# Patient Record
Sex: Female | Born: 1945
Health system: Southern US, Community
[De-identification: ages and names within clinical notes are randomized; demographics above are authoritative.]

## PROBLEM LIST (undated history)

## (undated) DIAGNOSIS — M859 Disorder of bone density and structure, unspecified: Secondary | ICD-10-CM

## (undated) DIAGNOSIS — M1990 Unspecified osteoarthritis, unspecified site: Secondary | ICD-10-CM

## (undated) DIAGNOSIS — B0229 Other postherpetic nervous system involvement: Secondary | ICD-10-CM

## (undated) DIAGNOSIS — IMO0001 Reserved for inherently not codable concepts without codable children: Secondary | ICD-10-CM

## (undated) DIAGNOSIS — F32A Depression, unspecified: Secondary | ICD-10-CM

## (undated) DIAGNOSIS — M8580 Other specified disorders of bone density and structure, unspecified site: Secondary | ICD-10-CM

## (undated) DIAGNOSIS — F329 Major depressive disorder, single episode, unspecified: Secondary | ICD-10-CM

## (undated) DIAGNOSIS — K219 Gastro-esophageal reflux disease without esophagitis: Secondary | ICD-10-CM

## (undated) DIAGNOSIS — E785 Hyperlipidemia, unspecified: Secondary | ICD-10-CM

## (undated) DIAGNOSIS — F419 Anxiety disorder, unspecified: Secondary | ICD-10-CM

## (undated) DIAGNOSIS — L719 Rosacea, unspecified: Secondary | ICD-10-CM

## (undated) DIAGNOSIS — Z8619 Personal history of other infectious and parasitic diseases: Secondary | ICD-10-CM

## (undated) DIAGNOSIS — R002 Palpitations: Secondary | ICD-10-CM

## (undated) DIAGNOSIS — I471 Supraventricular tachycardia, unspecified: Secondary | ICD-10-CM

## (undated) DIAGNOSIS — I5189 Other ill-defined heart diseases: Secondary | ICD-10-CM

## (undated) DIAGNOSIS — R011 Cardiac murmur, unspecified: Secondary | ICD-10-CM

## (undated) DIAGNOSIS — M797 Fibromyalgia: Secondary | ICD-10-CM

## (undated) DIAGNOSIS — I5030 Unspecified diastolic (congestive) heart failure: Secondary | ICD-10-CM

## (undated) DIAGNOSIS — L570 Actinic keratosis: Secondary | ICD-10-CM

## (undated) DIAGNOSIS — Z974 Presence of external hearing-aid: Secondary | ICD-10-CM

## (undated) DIAGNOSIS — I10 Essential (primary) hypertension: Secondary | ICD-10-CM

## (undated) DIAGNOSIS — J45909 Unspecified asthma, uncomplicated: Secondary | ICD-10-CM

## (undated) DIAGNOSIS — R55 Syncope and collapse: Secondary | ICD-10-CM

## (undated) DIAGNOSIS — I340 Nonrheumatic mitral (valve) insufficiency: Secondary | ICD-10-CM

## (undated) DIAGNOSIS — K589 Irritable bowel syndrome without diarrhea: Secondary | ICD-10-CM

## (undated) HISTORY — DX: Gastro-esophageal reflux disease without esophagitis: K21.9

## (undated) HISTORY — DX: Rosacea, unspecified: L71.9

## (undated) HISTORY — PX: TONSILLECTOMY: SHX5217

## (undated) HISTORY — PX: LAPAROSCOPIC OOPHERECTOMY: SHX6507

## (undated) HISTORY — PX: TONSILLECTOMY: SUR1361

## (undated) HISTORY — PX: BREAST SURGERY: SHX581

## (undated) HISTORY — DX: Other specified disorders of bone density and structure, unspecified site: M85.80

## (undated) HISTORY — DX: Fibromyalgia: M79.7

## (undated) HISTORY — DX: Disorder of bone density and structure, unspecified: M85.9

## (undated) HISTORY — DX: Reserved for inherently not codable concepts without codable children: IMO0001

## (undated) HISTORY — DX: Actinic keratosis: L57.0

## (undated) HISTORY — DX: Depression, unspecified: F32.A

## (undated) HISTORY — DX: Unspecified asthma, uncomplicated: J45.909

## (undated) HISTORY — DX: Irritable bowel syndrome, unspecified: K58.9

## (undated) HISTORY — PX: HAND SURGERY: SHX662

## (undated) HISTORY — PX: JOINT REPLACEMENT: SHX530

## (undated) HISTORY — DX: Major depressive disorder, single episode, unspecified: F32.9

---

## 1976-03-07 DIAGNOSIS — R569 Unspecified convulsions: Secondary | ICD-10-CM

## 1976-03-07 HISTORY — DX: Unspecified convulsions: R56.9

## 1977-03-07 HISTORY — PX: BREAST EXCISIONAL BIOPSY: SUR124

## 1988-03-07 HISTORY — PX: FOOT SURGERY: SHX648

## 2004-07-21 ENCOUNTER — Ambulatory Visit: Payer: Self-pay | Admitting: Family Medicine

## 2004-11-29 ENCOUNTER — Ambulatory Visit: Payer: Self-pay | Admitting: Internal Medicine

## 2005-01-18 ENCOUNTER — Ambulatory Visit: Payer: Self-pay | Admitting: Unknown Physician Specialty

## 2007-03-08 HISTORY — PX: ABDOMINAL HYSTERECTOMY: SHX81

## 2008-06-05 LAB — HM DEXA SCAN

## 2009-11-16 ENCOUNTER — Ambulatory Visit: Payer: Self-pay | Admitting: Family Medicine

## 2009-11-16 LAB — HM MAMMOGRAPHY

## 2009-12-01 LAB — HM PAP SMEAR

## 2010-12-31 ENCOUNTER — Ambulatory Visit: Payer: Self-pay | Admitting: Gastroenterology

## 2011-01-19 ENCOUNTER — Ambulatory Visit: Payer: Self-pay | Admitting: Gastroenterology

## 2011-01-19 HISTORY — PX: COLONOSCOPY: SHX174

## 2011-01-19 LAB — HM COLONOSCOPY: HM Colonoscopy: NORMAL

## 2011-01-21 LAB — PATHOLOGY REPORT

## 2011-09-28 LAB — CBC
HCT: 41.5 % (ref 35.0–47.0)
MCH: 31.9 pg (ref 26.0–34.0)
Platelet: 191 10*3/uL (ref 150–440)
RBC: 4.29 10*6/uL (ref 3.80–5.20)
WBC: 8 10*3/uL (ref 3.6–11.0)

## 2011-09-28 LAB — URINALYSIS, COMPLETE
Bilirubin,UR: NEGATIVE
Glucose,UR: NEGATIVE mg/dL (ref 0–75)
RBC,UR: 2 /HPF (ref 0–5)
Specific Gravity: 1.01 (ref 1.003–1.030)
WBC UR: 2 /HPF (ref 0–5)

## 2011-09-28 LAB — COMPREHENSIVE METABOLIC PANEL
Albumin: 3.9 g/dL (ref 3.4–5.0)
Anion Gap: 13 (ref 7–16)
BUN: 14 mg/dL (ref 7–18)
Chloride: 103 mmol/L (ref 98–107)
Co2: 22 mmol/L (ref 21–32)
EGFR (African American): >60
Glucose: 113 mg/dL — ABNORMAL HIGH (ref 65–99)
Osmolality: 277 (ref 275–301)
SGOT(AST): 28 U/L (ref 15–37)
SGPT (ALT): 18 U/L
Sodium: 138 mmol/L (ref 136–145)
Total Protein: 7.1 g/dL (ref 6.4–8.2)

## 2011-09-28 LAB — CK TOTAL AND CKMB (NOT AT ARMC): CK, Total: 94 U/L (ref 21–215)

## 2011-09-29 ENCOUNTER — Observation Stay: Payer: Self-pay | Admitting: Internal Medicine

## 2011-09-29 LAB — TROPONIN I: Troponin-I: <0.02 ng/mL

## 2012-08-20 ENCOUNTER — Ambulatory Visit: Payer: Self-pay | Admitting: Unknown Physician Specialty

## 2012-10-24 ENCOUNTER — Ambulatory Visit: Payer: Self-pay | Admitting: Family Medicine

## 2012-12-20 ENCOUNTER — Ambulatory Visit: Payer: Self-pay | Admitting: Podiatry

## 2014-06-24 NOTE — H&P (Signed)
PATIENT NAME:  Andrea Savage, Andrea Savage MR#:  [id number] DATE OF BIRTH:  1945/04/18  DATE OF ADMISSION:  09/29/2011  PRIMARY CARE PHYSICIAN: Dr. Golden Pop.   CHIEF COMPLAINT: Near syncope.   HISTORY OF PRESENT ILLNESS: Ms. Duguay is a 69 year old pleasant Caucasian female with unremarkable past medical history. She was in her usual state of health until last evening while she was finishing her meeting at the church around 8:00 p.m. She felt hot and started sweating profusely. She felt that her legs and hands were tingling and generally she felt strange. Thereafter, she tried to drink some water and then she felt that she is going to faint. At that point, she decided to go to her car. She drove back to her home and when she reached there, she tried to walk on the front steps but she states that her leg would not go over the steps the feeling of generalized weakness. She felt that she was going to pass out. She was soaking wet from profuse sweating. At that point, her husband saw her in that shape and they decided to call 911. The patient was transported to the Emergency Department for further evaluation. Her symptoms gradually faded away and now she feels back to normal. Initial work-up including EKG and blood work-up were all unremarkable. The patient was admitted for 24-hour observation on telemetry monitoring. The patient reports no preceding symptoms to her near syncope. Denies any palpitations. No chest pain. No headache. No blurring of vision. No vomiting. No nausea. No abdominal pain.   REVIEW OF SYSTEMS: CONSTITUTIONAL: Denies any fever. No chills. No night sweats. EYES: No blurring of vision. No double vision. ENT: No hearing impairment. No sore throat. No dysphagia. CARDIOVASCULAR: No chest pain. No shortness of breath. No edema. No true syncope, but she had near syncope symptoms. RESPIRATORY: No chest pain. No shortness of breath. No cough. GASTROINTESTINAL: No abdominal pain. No vomiting, no  diarrhea. GENITOURINARY: No dysuria. No frequency of urination. MUSCULOSKELETAL: No joint pain or swelling. No muscular pain or swelling. INTEGUMENTARY: No skin rash. No ulcers. NEUROLOGY: No focal weakness. No seizure activity. No headache. No ataxia, but during the event she had generalized weakness. PSYCHIATRY: No anxiety. No depression. ENDOCRINE: No polyuria or polydipsia. No heat or cold intolerance.   PAST MEDICAL HISTORY: She reports an episode several years ago but was labeled as heat stroke with similar symptoms. Otherwise her past medical history is healthy.   PAST SURGICAL HISTORY:  1. Bilateral salpingo-oophorectomy in 2006.  2. Colonoscopy in November 2012 which was normal and upper endoscopy in November 2012 showing reflux esophagitis.  3. The patient also suffers from chronic neck pain and recently she had work-up including MRI.   FAMILY HISTORY: Her sister suffers from diabetes mellitus. Her mother died from breast cancer. Her father suffered from emphysema but he died from pancreatic cancer. Her grandmother died from complications of tuberculosis.   SOCIAL HABITS: Nonsmoker. No history of alcohol abuse.   SOCIAL HISTORY: She is married, living with her husband. She works as a [employer]ar[employer] manager.   ADMISSION MEDICATIONS:  1. Prilosec 40 mg once a day.  2. Citalopram 10 mg a day.   ALLERGIES: Compazine.   PHYSICAL EXAMINATION:  VITAL SIGNS: Blood pressure 139/84, respiratory rate 20, pulse 127, however, by EKG her heart rate was 64, temperature 97, oxygen saturation 99%.   GENERAL APPEARANCE: Elderly pleasant lady lying in bed in no acute distress, healthy looking.   HEAD AND NECK: No pallor. No icterus.  No cyanosis.   ENT: Hearing was normal. Nasal mucosa, lips, tongue were normal.   EYES: Normal iris and conjunctivae. Pupils about 5 mm, equal and reactive to light.   NECK: Supple. Trachea at midline. No thyromegaly. No cervical masses.   HEART: Normal S1, S2. No S3 or S4. There  is a grade 1/6 systolic murmur at the left sternal border. The patient is aware about this murmur for a long time. No carotid bruits.   RESPIRATORY: Normal breathing pattern without use of accessory muscles. No rales. No wheezing.   ABDOMEN: Soft without tenderness. No hepatosplenomegaly. No masses. No hernias.   SKIN: No ulcers. No subcutaneous nodules.   MUSCULOSKELETAL: No joint swelling. No clubbing.   NEUROLOGIC: Cranial nerves II through XII are intact. No focal motor deficit.   PSYCHIATRIC: The patient is alert and oriented x3. Mood and affect were normal.   LABORATORY, RADIOLOGICAL AND DIAGNOSTIC DATA: CT scan of the head was negative for any acute intracranial abnormality. Her EKG showed normal sinus rhythm at rate of 64 per minute. There is one premature atrial complex. Incomplete right bundle branch block. Otherwise unremarkable EKG. Her serum glucose 113, BUN 14, creatinine 0.8, sodium 138, potassium 3.5. Normal liver function tests. Total CPK 94. Troponin less than 0.02. CBC showed normal white count 8,000, hemoglobin 13, hematocrit 41, platelet count 191. Urinalysis was unremarkable.   ASSESSMENT:  1. Near syncope. Etiology is unclear. It could be vasovagal attack or some form of arrhythmia. Transient ischemic attack is less likely. Hypoglycemia could be another possibility. Nevertheless, her symptoms resolved gradually.  2. Chronic neck pain.  3. Reflux esophagitis by upper endoscopy in November 2012.   PLAN:  1. Will admit the patient for observation for 24 hours on telemetry monitoring.  2. Follow up on cardiac enzymes.  3. Will obtain carotid duplex scan.  4. The patient indicates that she did not have a LIVING WILL nor has she considered appointing anybody to have power of attorney.       TIME SPENT IN EVALUATING THIS PATIENT: More than 55 minutes.   ____________________________ Clovis Pu. Lenore Manner, MD amd:ap D: 09/29/2011 03:14:43 ET T: 09/29/2011 08:57:13  ET JOB#: [id number]  cc: Clovis Pu. Lenore Manner, MD, <Dictator> Guadalupe Maple, MD Mike Craze Irven Coe MD ELECTRONICALLY SIGNED 09/30/2011 22:27

## 2014-06-24 NOTE — Discharge Summary (Signed)
PATIENT NAME:  Andrea Savage, Andrea Savage MR#:  [id number] DATE OF BIRTH:  1945-06-08  DATE OF ADMISSION:  09/29/2011 DATE OF DISCHARGE:  09/29/2011  ADMITTING DIAGNOSIS: Syncope with collapse with some altered mental status.   DISCHARGE DIAGNOSES:  1. Syncope with collapse of unclear etiology, possibly due to heat exhaustion. No evidence of any arrhythmias noted. Her carotid Doppler's did show plaque but no significant stenosis. Her echocardiogram was negative and her telemetry showed no arrhythmias. Cardiac enzymes were negative. History of similar type of presentation in [place] after being dehydrated.  2. Status post bilateral salpingo-oophorectomy.  3. Chronic neck pain. Work-up with MRI was negative.   PERTINENT LABS AND EVALUATIONS: CT scan the head was negative for acute intracranial abnormality.   EKG showed normal sinus rhythm, PVC, incomplete right bundle branch block.   Glucose 113, BUN 14, creatinine 0.8, sodium 138, potassium 3.5. LFTs were normal. CPK 94. Troponin less than 0.02. CBC showed WBC count 8000, hemoglobin 13, platelet count 191. Urinalysis was negative.   CT scan of the head showed no acute abnormality.   Echocardiogram showed normal EF, right ventricular size was normal, left atrium size was normal.  Carotid ultrasound showed no evidence of hemodynamic stenosis. There is calcified plaque seen in the carotid bulb in the proximal ICA.  PA and lateral chest x-ray was negative.  EKG showed sinus rhythm with PVCs.   CONSULTANTS: None.   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 she started feeling hot and started sweating profusely. The patient started feeling legs and hands were tingling. She thinks that she passed out. She was going "in and out". EMS was called. When they  arrived they did not notice any significant abnormality. Apparently in the ED initially they were not able to detect her temperature. The patient was admitted to the hospital. She was monitored on telemetry. She had actually been seen in the ED the night before and finally we were asked to admit her around 12 o'clock. She stayed in the hospital up to 5 in the evening without any arrhythmias and no symptoms. She was ambulating fine. Her echocardiogram was negative. Carotid Doppler's showed no significant stenosis. No arrhythmias were noted. She is doing much better and is very interested in going home. If she has another episode like this, she needs a Holter monitor. The patient also has had a stress test done which has been negative in the last year. She will be referred to Cardiology again to see if she needs any further stress test. At this time she is stable for discharge.   DISCHARGE MEDICATIONS:  1. Prilosec 40 daily.  2. Citalopram 10 daily.  3. Iodine mild 1 tab p.o. daily.   HOME OXYGEN: None.   DIET: Regular.   ACTIVITY: As tolerated.   TIMEFRAME FOR FOLLOW-UP:  1. Follow-up in 1 to 2 weeks with Dr. Golden Pop. 2. Follow-up in 2 to 4 weeks with [facility].   TIME SPENT: 35 minutes.  ____________________________ Lafonda Mosses Posey Pronto, MD shp:drc D: 09/30/2011 16:37:42 ET T: 10/01/2011 10:58:50 ET JOB#: [id number] cc: Silena Wyss H. Posey Pronto, MD, <Dictator>, Guadalupe Maple, MD Alric Seton MD ELECTRONICALLY SIGNED 10/04/2011 12:15

## 2014-08-18 ENCOUNTER — Other Ambulatory Visit: Payer: Self-pay

## 2014-08-18 MED ORDER — CITALOPRAM HYDROBROMIDE 20 MG PO TABS: 20.0000 mg | ORAL_TABLET | Freq: Every day | ORAL | Status: DC

## 2014-08-18 NOTE — Telephone Encounter (Signed)
Andrea Savage, she has not been seen in almost 2 years. She left a message stating she'd call next week to schedule an appt. She wants a 90 day supply.

## 2015-06-17 DIAGNOSIS — L719 Rosacea, unspecified: Secondary | ICD-10-CM | POA: Insufficient documentation

## 2015-06-17 DIAGNOSIS — K219 Gastro-esophageal reflux disease without esophagitis: Secondary | ICD-10-CM

## 2015-06-17 DIAGNOSIS — IMO0001 Reserved for inherently not codable concepts without codable children: Secondary | ICD-10-CM | POA: Insufficient documentation

## 2015-06-17 DIAGNOSIS — M8580 Other specified disorders of bone density and structure, unspecified site: Secondary | ICD-10-CM | POA: Insufficient documentation

## 2015-06-26 ENCOUNTER — Other Ambulatory Visit: Payer: Self-pay | Admitting: Unknown Physician Specialty

## 2015-06-29 ENCOUNTER — Encounter: Payer: Self-pay | Admitting: Family Medicine

## 2015-06-29 ENCOUNTER — Ambulatory Visit (INDEPENDENT_AMBULATORY_CARE_PROVIDER_SITE_OTHER): Payer: Medicare Other | Admitting: Family Medicine

## 2015-06-29 VITALS — BP 136/80 | HR 78 | Temp 98.6°F | Ht 64.0 in | Wt 148.0 lb

## 2015-06-29 DIAGNOSIS — Z0000 Encounter for general adult medical examination without abnormal findings: Secondary | ICD-10-CM | POA: Diagnosis not present

## 2015-06-29 DIAGNOSIS — Z13220 Encounter for screening for lipoid disorders: Secondary | ICD-10-CM | POA: Diagnosis not present

## 2015-06-29 DIAGNOSIS — K219 Gastro-esophageal reflux disease without esophagitis: Secondary | ICD-10-CM | POA: Diagnosis not present

## 2015-06-29 DIAGNOSIS — M797 Fibromyalgia: Secondary | ICD-10-CM | POA: Diagnosis not present

## 2015-06-29 DIAGNOSIS — F32A Depression, unspecified: Secondary | ICD-10-CM | POA: Insufficient documentation

## 2015-06-29 DIAGNOSIS — M8580 Other specified disorders of bone density and structure, unspecified site: Secondary | ICD-10-CM | POA: Diagnosis not present

## 2015-06-29 DIAGNOSIS — F329 Major depressive disorder, single episode, unspecified: Secondary | ICD-10-CM

## 2015-06-29 DIAGNOSIS — Z1231 Encounter for screening mammogram for malignant neoplasm of breast: Secondary | ICD-10-CM

## 2015-06-29 DIAGNOSIS — M25511 Pain in right shoulder: Secondary | ICD-10-CM

## 2015-06-29 DIAGNOSIS — IMO0001 Reserved for inherently not codable concepts without codable children: Secondary | ICD-10-CM

## 2015-06-29 NOTE — Assessment & Plan Note (Signed)
Continue to monitor. Call with any concerns.  

## 2015-06-29 NOTE — Assessment & Plan Note (Signed)
Thinks this is due to thyroid issues. Continue current regimen. Labs ordered today to recheck thyroid.

## 2015-06-29 NOTE — Patient Instructions (Addendum)
Health Risk Assessment and Personalized Prevention Plan: Done today Bone Mass Measurements: Ordered today Breast Cancer Screening: Ordered today CVD Screening: Done today Cervical Cancer Screening: N/A Colon Cancer Screening: up to date Depression Screening: declined Diabetes Screening: done today Glaucoma Screening: see your eye doctor Hepatitis B vaccine: up to date Hepatitis C screening: up to date HIV Screening: N/A Flu Vaccine: Post-poned to flu season Lung cancer Screening: N/A Obesity Screening: Done today Pneumonia Vaccines (2): Will consider STI Screening: N/A  Menopause is a normal process in which your reproductive ability comes to an end. This process happens gradually over a span of months to years, usually between the ages of 54 and 70. Menopause is complete when you have missed 12 consecutive menstrual periods. It is important to talk with your health care provider about some of the most common conditions that affect postmenopausal women, such as heart disease, cancer, and bone loss (osteoporosis). Adopting a healthy lifestyle and getting preventive care can help to promote your health and wellness. Those actions can also lower your chances of developing some of these common conditions. WHAT SHOULD I KNOW ABOUT MENOPAUSE? During menopause, you may experience a number of symptoms, such as:  Moderate-to-severe hot flashes.  Night sweats.  Decrease in sex drive.  Mood swings.  Headaches.  Tiredness.  Irritability.  Memory problems.  Insomnia. Choosing to treat or not to treat menopausal changes is an individual decision that you make with your health care provider. WHAT SHOULD I KNOW ABOUT HORMONE REPLACEMENT THERAPY AND SUPPLEMENTS? Hormone therapy products are effective for treating symptoms that are associated with menopause, such as hot flashes and night sweats. Hormone replacement carries certain risks, especially as you become older. If you are thinking  about using estrogen or estrogen with progestin treatments, discuss the benefits and risks with your health care provider. WHAT SHOULD I KNOW ABOUT HEART DISEASE AND STROKE? Heart disease, heart attack, and stroke become more likely as you age. This may be due, in part, to the hormonal changes that your body experiences during menopause. These can affect how your body processes dietary fats, triglycerides, and cholesterol. Heart attack and stroke are both medical emergencies. There are many things that you can do to help prevent heart disease and stroke:  Have your blood pressure checked at least every 1-2 years. High blood pressure causes heart disease and increases the risk of stroke.  If you are 32-4 years old, ask your health care provider if you should take aspirin to prevent a heart attack or a stroke.  Do not use any tobacco products, including cigarettes, chewing tobacco, or electronic cigarettes. If you need help quitting, ask your health care provider.  It is important to eat a healthy diet and maintain a healthy weight.  Be sure to include plenty of vegetables, fruits, low-fat dairy products, and lean protein.  Avoid eating foods that are high in solid fats, added sugars, or salt (sodium).  Get regular exercise. This is one of the most important things that you can do for your health.  Try to exercise for at least 150 minutes each week. The type of exercise that you do should increase your heart rate and make you sweat. This is known as moderate-intensity exercise.  Try to do strengthening exercises at least twice each week. Do these in addition to the moderate-intensity exercise.  Know your numbers.Ask your health care provider to check your cholesterol and your blood glucose. Continue to have your blood tested as directed by your  health care provider. WHAT SHOULD I KNOW ABOUT CANCER SCREENING? There are several types of cancer. Take the following steps to reduce your risk and  to catch any cancer development as early as possible. Breast Cancer  Practice breast self-awareness.  This means understanding how your breasts normally appear and feel.  It also means doing regular breast self-exams. Let your health care provider know about any changes, no matter how small.  If you are 40 or older, have a clinician do a breast exam (clinical breast exam or CBE) every year. Depending on your age, family history, and medical history, it may be recommended that you also have a yearly breast X-ray (mammogram).  If you have a family history of breast cancer, talk with your health care provider about genetic screening.  If you are at high risk for breast cancer, talk with your health care provider about having an MRI and a mammogram every year.  Breast cancer (BRCA) gene test is recommended for women who have family members with BRCA-related cancers. Results of the assessment will determine the need for genetic counseling and BRCA1 and for BRCA2 testing. BRCA-related cancers include these types:  Breast. This occurs in males or females.  Ovarian.  Tubal. This may also be called fallopian tube cancer.  Cancer of the abdominal or pelvic lining (peritoneal cancer).  Prostate.  Pancreatic. Cervical, Uterine, and Ovarian Cancer Your health care provider may recommend that you be screened regularly for cancer of the pelvic organs. These include your ovaries, uterus, and vagina. This screening involves a pelvic exam, which includes checking for microscopic changes to the surface of your cervix (Pap test).  For women ages 21-65, health care providers may recommend a pelvic exam and a Pap test every three years. For women ages 86-65, they may recommend the Pap test and pelvic exam, combined with testing for human papilloma virus (HPV), every five years. Some types of HPV increase your risk of cervical cancer. Testing for HPV may also be done on women of any age who have unclear Pap  test results.  Other health care providers may not recommend any screening for nonpregnant women who are considered low risk for pelvic cancer and have no symptoms. Ask your health care provider if a screening pelvic exam is right for you.  If you have had past treatment for cervical cancer or a condition that could lead to cancer, you need Pap tests and screening for cancer for at least 20 years after your treatment. If Pap tests have been discontinued for you, your risk factors (such as having a new sexual partner) need to be reassessed to determine if you should start having screenings again. Some women have medical problems that increase the chance of getting cervical cancer. In these cases, your health care provider may recommend that you have screening and Pap tests more often.  If you have a family history of uterine cancer or ovarian cancer, talk with your health care provider about genetic screening.  If you have vaginal bleeding after reaching menopause, tell your health care provider.  There are currently no reliable tests available to screen for ovarian cancer. Lung Cancer Lung cancer screening is recommended for adults 44-26 years old who are at high risk for lung cancer because of a history of smoking. A yearly low-dose CT scan of the lungs is recommended if you:  Currently smoke.  Have a history of at least 30 pack-years of smoking and you currently smoke or have quit within the  past 15 years. A pack-year is smoking an average of one pack of cigarettes per day for one year. Yearly screening should:  Continue until it has been 15 years since you quit.  Stop if you develop a health problem that would prevent you from having lung cancer treatment. Colorectal Cancer  This type of cancer can be detected and can often be prevented.  Routine colorectal cancer screening usually begins at age 34 and continues through age 68.  If you have risk factors for colon cancer, your health  care provider may recommend that you be screened at an earlier age.  If you have a family history of colorectal cancer, talk with your health care provider about genetic screening.  Your health care provider may also recommend using home test kits to check for hidden blood in your stool.  A small camera at the end of a tube can be used to examine your colon directly (sigmoidoscopy or colonoscopy). This is done to check for the earliest forms of colorectal cancer.  Direct examination of the colon should be repeated every 5-10 years until age 18. However, if early forms of precancerous polyps or small growths are found or if you have a family history or genetic risk for colorectal cancer, you may need to be screened more often. Skin Cancer  Check your skin from head to toe regularly.  Monitor any moles. Be sure to tell your health care provider:  About any new moles or changes in moles, especially if there is a change in a mole's shape or color.  If you have a mole that is larger than the size of a pencil eraser.  If any of your family members has a history of skin cancer, especially at a young age, talk with your health care provider about genetic screening.  Always use sunscreen. Apply sunscreen liberally and repeatedly throughout the day.  Whenever you are outside, protect yourself by wearing long sleeves, pants, a wide-brimmed hat, and sunglasses. WHAT SHOULD I KNOW ABOUT OSTEOPOROSIS? Osteoporosis is a condition in which bone destruction happens more quickly than new bone creation. After menopause, you may be at an increased risk for osteoporosis. To help prevent osteoporosis or the bone fractures that can happen because of osteoporosis, the following is recommended:  If you are 75-38 years old, get at least 1,000 mg of calcium and at least 600 mg of vitamin D per day.  If you are older than age 33 but younger than age 42, get at least 1,200 mg of calcium and at least 600 mg of  vitamin D per day.  If you are older than age 28, get at least 1,200 mg of calcium and at least 800 mg of vitamin D per day. Smoking and excessive alcohol intake increase the risk of osteoporosis. Eat foods that are rich in calcium and vitamin D, and do weight-bearing exercises several times each week as directed by your health care provider. WHAT SHOULD I KNOW ABOUT HOW MENOPAUSE AFFECTS [place]? Depression may occur at any age, but it is more common as you become older. Common symptoms of depression include:  Low or sad mood.  Changes in sleep patterns.  Changes in appetite or eating patterns.  Feeling an overall lack of motivation or enjoyment of activities that you previously enjoyed.  Frequent crying spells. Talk with your health care provider if you think that you are experiencing depression. WHAT SHOULD I KNOW ABOUT IMMUNIZATIONS? It is important that you get and maintain  your immunizations. These include:  Tetanus, diphtheria, and pertussis (Tdap) booster vaccine.  Influenza every year before the flu season begins.  Pneumonia vaccine.  Shingles vaccine. Your health care provider may also recommend other immunizations.   This information is not intended to replace advice given to you by your health care provider. Make sure you discuss any questions you have with your health care provider.   Document Released: 04/15/2005 Document Revised: 03/14/2014 Document Reviewed: 10/24/2013 Elsevier Interactive Patient Education 2016 [place] Directive Advance directives are the legal documents that allow you to make choices about your health care and medical treatment if you cannot speak for yourself. Advance directives are a way for you to communicate your wishes to family, friends, and health care providers. The specified people can then convey your decisions about end-of-life care to avoid confusion if you should become unable to communicate. Ideally, the  process of discussing and writing advance directives should happen over time rather than making decisions all at once. Advance directives can be modified as your situation changes, and you can change your mind at any time, even after you have signed the advance directives. Each state has its own laws regarding advance directives. You may want to check with your health care provider, attorney, or state representative about the law in your state. Below are some examples of advance directives. LIVING WILL A living will is a set of instructions documenting your wishes about medical care when you cannot care for yourself. It is used if you become:  Terminally ill.  Incapacitated.  Unable to communicate.  Unable to make decisions. Items to consider in your living will include:  The use or non-use of life-sustaining equipment, such as dialysis machines and breathing machines (ventilators).  A do not resuscitate (DNR) order, which is the instruction not to use cardiopulmonary resuscitation (CPR) if breathing or heartbeat stops.  Tube feeding.  Withholding of food and fluids.  Comfort (palliative) care when the goal becomes comfort rather than a cure.  Organ and tissue donation. A living will does not give instructions about distribution of your money and property if you should pass away. It is advisable to seek the expert advice of a lawyer in drawing up a will regarding your possessions. Decisions about taxes, beneficiaries, and asset distribution will be legally binding. This process can relieve your family and friends of any burdens surrounding disputes or questions that may come up about the allocation of your assets. DO NOT RESUSCITATE (DNR) A do not resuscitate (DNR) order is a request to not have CPR in the event that your heart stops beating or you stop breathing. Unless given other instructions, a health care provider will try to help any patient whose heart has stopped or who has stopped  breathing.  HEALTH CARE PROXY AND DURABLE POWER OF ATTORNEY FOR HEALTH CARE A health care proxy is a person (agent) appointed to make medical decisions for you if you cannot. Generally, people choose someone they know well and trust to represent their preferences when they can no longer do so. You should be sure to ask this person for agreement to act as your agent. An agent may have to exercise judgment in the event of a medical decision for which your wishes are not known. The durable power of attorney for health care is the legal document that names your health care proxy. Once written, it should be:  Signed.  Notarized.  Dated.  Copied.  Witnessed.  Incorporated into your medical  record. You may also want to appoint someone to manage your financial affairs if you cannot. This is called a durable power of attorney for finances. It is a separate legal document from the durable power of attorney for health care. You may choose the same person or someone different from your health care proxy to act as your agent in financial matters.   This information is not intended to replace advice given to you by your health care provider. Make sure you discuss any questions you have with your health care provider.   Document Released: 05/31/2007 Document Revised: 02/26/2013 Document Reviewed: 07/11/2012 Elsevier Interactive Patient Education 2016 [place]. Pneumococcal Vaccine, Polyvalent suspension for injection What is this medicine? PNEUMOCOCCAL VACCINE (NEU mo KOK al vak SEEN) is a vaccine used to prevent pneumococcus bacterial infections. These bacteria can cause serious infections like pneumonia, meningitis, and blood infections. This vaccine will lower your chance of getting pneumonia. If you do get pneumonia, it can make your symptoms milder and your illness shorter. This vaccine will not treat an infection and will not cause infection. This vaccine is recommended for infants and young  children, adults with certain medical conditions, and adults 26 years or older. This medicine may be used for other purposes; ask your health care provider or pharmacist if you have questions. What should I tell my health care provider before I take this medicine? They need to know if you have any of these conditions: -bleeding problems -fever -immune system problems -an unusual or allergic reaction to pneumococcal vaccine, diphtheria toxoid, other vaccines, latex, other medicines, foods, dyes, or preservatives -pregnant or trying to get pregnant -breast-feeding How should I use this medicine? This vaccine is for injection into a muscle. It is given by a health care professional. A copy of Vaccine Information Statements will be given before each vaccination. Read this sheet carefully each time. The sheet may change frequently. Talk to your pediatrician regarding the use of this medicine in children. While this drug may be prescribed for children as young as 31 weeks old for selected conditions, precautions do apply. Overdosage: If you think you have taken too much of this medicine contact a poison control center or emergency room at once. NOTE: This medicine is only for you. Do not share this medicine with others. What if I miss a dose? It is important not to miss your dose. Call your doctor or health care professional if you are unable to keep an appointment. What may interact with this medicine? -medicines for cancer chemotherapy -medicines that suppress your immune function -steroid medicines like prednisone or cortisone This list may not describe all possible interactions. Give your health care provider a list of all the medicines, herbs, non-prescription drugs, or dietary supplements you use. Also tell them if you smoke, drink alcohol, or use illegal drugs. Some items may interact with your medicine. What should I watch for while using this medicine? Mild fever and pain should go away in  3 days or less. Report any unusual symptoms to your doctor or health care professional. What side effects may I notice from receiving this medicine? Side effects that you should report to your doctor or health care professional as soon as possible: -allergic reactions like skin rash, itching or hives, swelling of the face, lips, or tongue -breathing problems -confused -fast or irregular heartbeat -fever over 102 degrees F -seizures -unusual bleeding or bruising -unusual muscle weakness Side effects that usually do not require medical attention (report to your  doctor or health care professional if they continue or are bothersome): -aches and pains -diarrhea -fever of 102 degrees F or less -headache -irritable -loss of appetite -pain, tender at site where injected -trouble sleeping This list may not describe all possible side effects. Call your doctor for medical advice about side effects. You may report side effects to FDA at 1-800-FDA-1088. Where should I keep my medicine? This does not apply. This vaccine is given in a clinic, pharmacy, doctor's office, or other health care setting and will not be stored at home. NOTE: This sheet is a summary. It may not cover all possible information. If you have questions about this medicine, talk to your doctor, pharmacist, or health care provider.    2016, Elsevier/[employer]. (2013-11-28 10:27:27)

## 2015-06-29 NOTE — Progress Notes (Signed)
BP 136/80 mmHg  Pulse 78  Temp(Src) 98.6 F (37 C)  Ht 5\' 4"  (1.626 m)  Wt 148 lb (67.132 kg)  BMI 25.39 kg/m2  SpO2 98%   Subjective:    Patient ID: Sharyn Blitz, female    DOB: 27-Jan-1946, 70 y.o.   MRN: [id number]  HPI: RASHONDA BARROGA is a 70 y.o. female presenting on 06/29/2015 for comprehensive medical examination. Current medical complaints include: Has been having R shoulder pain for about 4 months. 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   Just saw endocrinology. Thinks that she has a thyroid issues, but her levels come back normal. However she is having a lot of issues with issues with symptoms of thyroid, is convinced that this is due to thyroid. Has started taking iodine medication with supplements. Has been doing well with it.   She currently lives with: husband Menopausal Symptoms: no  Functional Status Survey: Is the patient deaf or have difficulty hearing?: Yes (patient has hearing aid right ear) Does the patient have difficulty seeing, even when wearing glasses/contacts?: No Does the patient have difficulty concentrating, remembering, or making decisions?: Yes (Patient is having problems, she believes that it is thyroid related) Does the patient have difficulty walking or climbing stairs?: No Does the patient have difficulty dressing or bathing?: Yes (Shoulder pain) Does the patient have difficulty doing errands alone such as visiting a doctor's office or shopping?: No  Depression Screen Patient refused  Advanced Directive- Not done, advised.   Past Medical History:  Past Medical History  Diagnosis Date  . Osteopenia   . Reflux   . Rosacea   . IBS (irritable bowel syndrome)   . Fibromyalgia   . Depression    Surgical History:  Past Surgical History  Procedure Laterality Date  . Abdominal hysterectomy  2009    Total  . Colonoscopy  [id number]    repeat 10 years  . Tonsillectomy    . Foot surgery Right 1990  . Hand surgery  Right     Medications:  Current Outpatient Prescriptions on File Prior to Visit  Medication Sig  . citalopram (CELEXA) 20 MG tablet Take 1 tablet (20 mg total) by mouth daily.   No current facility-administered medications on file prior to visit.   Allergies:  Allergies  Allergen Reactions  . Compazine [Prochlorperazine]     Social History:  Social History   Social History  . Marital Status: Married    Spouse Name: N/A  . Number of Children: N/A  . Years of Education: N/A   Occupational History  . Not on file.   Social History Main Topics  . Smoking status: Never Smoker   . Smokeless tobacco: Never Used  . Alcohol Use: Yes     Comment: on occasion  . Drug Use: No  . Sexual Activity: No   Other Topics Concern  . Not on file   Social History Narrative   History  Smoking status  . Never Smoker   Smokeless tobacco  . Never Used   History  Alcohol Use  . Yes    Comment: on occasion   Family History:  Family History  Problem Relation Age of Onset  . Cancer Mother     breast  . Cancer Father     pancreatic  . Liver disease Sister   . Diabetes Sister   . Thyroid disease Sister   . Thyroid disease Brother   . Kidney disease Brother    Past medical  history, surgical history, medications, allergies, family history and social history reviewed with patient today and changes made to appropriate areas of the chart.   Review of Systems  Constitutional: Positive for diaphoresis. Negative for fever, chills, weight loss and malaise/fatigue.  HENT: Positive for hearing loss. Negative for congestion, ear discharge, ear pain, nosebleeds, sore throat and tinnitus.   Eyes: Negative.   Respiratory: Negative.  Negative for stridor.   Cardiovascular: Negative.   Gastrointestinal: Positive for heartburn and constipation. Negative for nausea, vomiting, abdominal pain, diarrhea, blood in stool and melena.       Dysphagia  Genitourinary: Negative.   Musculoskeletal:  Positive for myalgias and joint pain. Negative for back pain, falls and neck pain.  Skin: Negative.   Neurological: Negative.  Negative for weakness and headaches.  Endo/Heme/Allergies: Negative for environmental allergies and polydipsia. Bruises/bleeds easily.  Psychiatric/Behavioral: Positive for depression. Negative for suicidal ideas, hallucinations, memory loss and substance abuse. The patient is not nervous/anxious and does not have insomnia.    All other ROS negative except what is listed above and in the HPI.      Objective:    BP 136/80 mmHg  Pulse 78  Temp(Src) 98.6 F (37 C)  Ht 5\' 4"  (1.626 m)  Wt 148 lb (67.132 kg)  BMI 25.39 kg/m2  SpO2 98%  Wt Readings from Last 3 Encounters:  06/29/15 148 lb (67.132 kg)  10/24/12 144 lb (65.318 kg)     Hearing Screening   125Hz  250Hz  500Hz  1000Hz  2000Hz  4000Hz  8000Hz   Right ear:   Fail Fail Fail Fail   Left ear:   20 20 20 20      Visual Acuity Screening   Right eye Left eye Both eyes  Without correction:     With correction: 20/40 20/25 20/25    Physical Exam  Constitutional: She is oriented to person, place, and time. She appears well-developed and well-nourished. No distress.  HENT:  Head: Normocephalic and atraumatic.  Right Ear: Hearing and external ear normal.  Left Ear: Hearing and external ear normal.  Nose: Nose normal.  Mouth/Throat: Oropharynx is clear and moist.  Eyes: Conjunctivae, EOM and lids are normal. Pupils are equal, round, and reactive to light. Right eye exhibits no discharge. Left eye exhibits no discharge. No scleral icterus.  Neck: Normal range of motion. Neck supple. No JVD present. No tracheal deviation present. No thyromegaly present.  Cardiovascular: Normal rate, regular rhythm, normal heart sounds and intact distal pulses.  Exam reveals no gallop and no friction rub.   No murmur heard. Pulmonary/Chest: Effort normal and breath sounds normal. No stridor. No respiratory distress. She has no  wheezes. She has no rales. She exhibits no tenderness. Right breast exhibits no inverted nipple, no mass, no nipple discharge, no skin change and no tenderness. Left breast exhibits no inverted nipple, no mass, no nipple discharge, no skin change and no tenderness. Breasts are symmetrical.  Abdominal: Soft. Bowel sounds are normal. She exhibits no distension and no mass. There is no tenderness. There is no rebound and no guarding.  Genitourinary:  Deferred with shared decision making.   Musculoskeletal: She exhibits tenderness. She exhibits no edema.  Lipoma R thigh Significant decreased ROM and tenderness to R shoulder  Lymphadenopathy:    She has no cervical adenopathy.  Neurological: She is alert and oriented to person, place, and time. She has normal reflexes. She displays normal reflexes. No cranial nerve deficit. She exhibits normal muscle tone. Coordination normal.  Skin: Skin is warm, dry  and intact. No rash noted. She is not diaphoretic. No erythema. No pallor.  Psychiatric: She has a normal mood and affect. Her speech is normal and behavior is normal. Judgment and thought content normal. Cognition and memory are normal.  Nursing note and vitals reviewed.   Cognitive Testing - 6-CIT  Correct? Score   What year is it? yes 0 Yes = 0    No = 4  What month is it? yes 0 Yes = 0    No = 3  Remember:     Pia [address]au, [place], [place]     What time is it? yes 0 Yes = 0    No = 3  Count backwards from 20 to 1 yes 0 Correct = 0    1 error = 2   More than 1 error = 4  Say the months of the year in reverse. yes 0 Correct = 0    1 error = 2   More than 1 error = 4  What address did I ask you to remember? yes 0 Correct = 0  1 error = 2    2 error = 4    3 error = 6    4 error = 8    All wrong = 10       TOTAL SCORE  0/28   Interpretation:  Normal  Normal (0-7) Abnormal (8-28)   Results for orders placed or performed in visit on 06/17/15  HM MAMMOGRAPHY  Result Value Ref Range   HM  Mammogram 0-4 Bi-Rad 0-4 Bi-Rad, Self Reported Normal  HM DEXA SCAN  Result Value Ref Range   HM Dexa Scan per PP   HM PAP SMEAR  Result Value Ref Range   HM Pap smear per PP   HM COLONOSCOPY  Result Value Ref Range   HM Colonoscopy Patient Reported Normal See Report, Patient Reported Normal      Assessment & Plan:   Problem List Items Addressed This Visit      Musculoskeletal and Integument   Osteopenia    DEXA ordered today.       Relevant Orders   DG Bone Density     Other   Reflux    Continue to monitor. Call with any concerns.       Depression    Thinks this is due to thyroid issues. Continue current regimen. Labs ordered today to recheck thyroid.       Relevant Orders   Thyroid Panel With TSH    Other Visit Diagnoses    Medicare annual wellness visit, subsequent    -  Primary    Screening labs checked today. Preventative care discussed as below. Continue to monitor.     Fibromyalgia        Checking labs. Continue to monitor.     Relevant Orders    Comprehensive metabolic panel    Thyroid Panel With TSH    Screening for cholesterol level        Checking labs today. Await results.     Relevant Orders    Lipid Panel w/o Chol/HDL Ratio    Gastroesophageal reflux disease, esophagitis presence not specified        Relevant Medications    omeprazole (PRILOSEC) 20 MG capsule    Other Relevant Orders    CBC with Differential/Platelet    Comprehensive metabolic panel    Encounter for screening mammogram for breast cancer        Mammogram  ordered today.    Relevant Orders    MM DIGITAL SCREENING BILATERAL    Right shoulder pain        To see Orthopedics this afternoon. Continue to follow with them.        Health Risk Assessment and Personalized Prevention Plan: Done today Bone Mass Measurements: Ordered today Breast Cancer Screening: Ordered today CVD Screening: Done today Cervical Cancer Screening: N/A Colon Cancer Screening: up to date Depression  Screening: declined Diabetes Screening: done today Glaucoma Screening: see your eye doctor Hepatitis B vaccine: up to date Hepatitis C screening: up to date HIV Screening: N/A Flu Vaccine: Post-poned to flu season Lung cancer Screening: N/A Obesity Screening: Done today Pneumonia Vaccines (2): Will consider STI Screening: N/A  Follow up plan: Return in about 6 months (around 12/29/2015) for follow up.   LABORATORY TESTING:  - Pap smear: not applicable  IMMUNIZATIONS:   - Tdap: Tetanus vaccination status reviewed: last tetanus booster within 10 years. - Influenza: Postponed to flu season - Pneumovax: Will look into information and consider - Prevnar: Will look into information and consider - Zostavax vaccine: Up to date  SCREENING: -Mammogram: Ordered today  - Colonoscopy: Up to date  - Bone Density: Ordered today  -Hearing Test: Up to date   PATIENT COUNSELING:   Advised to take 1 mg of folate supplement per day if capable of pregnancy.   Sexuality: Discussed sexually transmitted diseases, partner selection, use of condoms, avoidance of unintended pregnancy  and contraceptive alternatives.   Advised to avoid cigarette smoking.  I discussed with the patient that most people either abstain from alcohol or drink within safe limits (<=14/week and <=4 drinks/occasion for males, <=7/weeks and <= 3 drinks/occasion for females) and that the risk for alcohol disorders and other health effects rises proportionally with the number of drinks per week and how often a drinker exceeds daily limits.  Discussed cessation/primary prevention of drug use and availability of treatment for abuse.   Diet: Encouraged to adjust caloric intake to maintain  or achieve ideal body weight, to reduce intake of dietary saturated fat and total fat, to limit sodium intake by avoiding high sodium foods and not adding table salt, and to maintain adequate dietary potassium and calcium preferably from fresh  fruits, vegetables, and low-fat dairy products.    stressed the importance of regular exercise  Injury prevention: Discussed safety belts, safety helmets, smoke detector, smoking near bedding or upholstery.   Dental health: Discussed importance of regular tooth brushing, flossing, and dental visits.    NEXT PREVENTATIVE PHYSICAL DUE IN 1 YEAR. Return in about 6 months (around 12/29/2015) for follow up.

## 2015-06-29 NOTE — Assessment & Plan Note (Signed)
DEXA ordered today.  

## 2015-06-30 ENCOUNTER — Encounter: Payer: Self-pay | Admitting: Family Medicine

## 2015-06-30 LAB — COMPREHENSIVE METABOLIC PANEL
A/G RATIO: 1.5 (ref 1.2–2.2)
ALT: 16 IU/L (ref 0–32)
AST: 18 IU/L (ref 0–40)
Albumin: 4 g/dL (ref 3.6–4.8)
Alkaline Phosphatase: 72 IU/L (ref 39–117)
BUN / CREAT RATIO: 23 (ref 12–28)
BUN: 17 mg/dL (ref 8–27)
Bilirubin Total: 0.2 mg/dL (ref 0.0–1.2)
CALCIUM: 9.2 mg/dL (ref 8.7–10.3)
CO2: 25 mmol/L (ref 18–29)
Chloride: 98 mmol/L (ref 96–106)
Creatinine, Ser: 0.75 mg/dL (ref 0.57–1.00)
GFR, EST AFRICAN AMERICAN: 94 mL/min/{1.73_m2} (ref >59)
GFR, EST NON AFRICAN AMERICAN: 82 mL/min/{1.73_m2} (ref >59)
GLOBULIN, TOTAL: 2.6 g/dL (ref 1.5–4.5)
Glucose: 83 mg/dL (ref 65–99)
Potassium: 4.3 mmol/L (ref 3.5–5.2)
Sodium: 138 mmol/L (ref 134–144)
TOTAL PROTEIN: 6.6 g/dL (ref 6.0–8.5)

## 2015-06-30 LAB — THYROID PANEL WITH TSH
FREE THYROXINE INDEX: 2.3 (ref 1.2–4.9)
T3 UPTAKE RATIO: 30 % (ref 24–39)
T4 TOTAL: 7.8 ug/dL (ref 4.5–12.0)
TSH: 3.18 u[IU]/mL (ref 0.450–4.500)

## 2015-06-30 LAB — LIPID PANEL W/O CHOL/HDL RATIO
Cholesterol, Total: 214 mg/dL — ABNORMAL HIGH (ref 100–199)
HDL: 65 mg/dL (ref >39)
LDL CALC: 110 mg/dL — AB (ref 0–99)
Triglycerides: 196 mg/dL — ABNORMAL HIGH (ref 0–149)
VLDL CHOLESTEROL CAL: 39 mg/dL (ref 5–40)

## 2015-06-30 LAB — CBC WITH DIFFERENTIAL/PLATELET
BASOS: 0 %
Basophils Absolute: 0 10*3/uL (ref 0.0–0.2)
EOS (ABSOLUTE): 0.1 10*3/uL (ref 0.0–0.4)
EOS: 2 %
HEMATOCRIT: 39.6 % (ref 34.0–46.6)
Hemoglobin: 13.4 g/dL (ref 11.1–15.9)
IMMATURE GRANS (ABS): 0 10*3/uL (ref 0.0–0.1)
IMMATURE GRANULOCYTES: 0 %
LYMPHS: 35 %
Lymphocytes Absolute: 1.5 10*3/uL (ref 0.7–3.1)
MCH: 32.3 pg (ref 26.6–33.0)
MCHC: 33.8 g/dL (ref 31.5–35.7)
MCV: 95 fL (ref 79–97)
Monocytes Absolute: 0.4 10*3/uL (ref 0.1–0.9)
Monocytes: 10 %
NEUTROS PCT: 53 %
Neutrophils Absolute: 2.2 10*3/uL (ref 1.4–7.0)
Platelets: 217 10*3/uL (ref 150–379)
RBC: 4.15 x10E6/uL (ref 3.77–5.28)
RDW: 13 % (ref 12.3–15.4)
WBC: 4.1 10*3/uL (ref 3.4–10.8)

## 2015-09-16 ENCOUNTER — Other Ambulatory Visit: Payer: Self-pay | Admitting: Orthopedic Surgery

## 2015-09-16 DIAGNOSIS — M5412 Radiculopathy, cervical region: Secondary | ICD-10-CM

## 2015-09-19 ENCOUNTER — Ambulatory Visit (HOSPITAL_COMMUNITY)
Admission: RE | Admit: 2015-09-19 | Discharge: 2015-09-19 | Disposition: A | Discharge status: Home / Self Care | Payer: Medicare Other | Source: Ambulatory Visit | Attending: Orthopedic Surgery | Admitting: Orthopedic Surgery

## 2015-09-19 DIAGNOSIS — M4802 Spinal stenosis, cervical region: Secondary | ICD-10-CM | POA: Insufficient documentation

## 2015-09-19 DIAGNOSIS — M50223 Other cervical disc displacement at C6-C7 level: Secondary | ICD-10-CM | POA: Diagnosis not present

## 2015-09-19 DIAGNOSIS — M50221 Other cervical disc displacement at C4-C5 level: Secondary | ICD-10-CM | POA: Diagnosis not present

## 2015-09-19 DIAGNOSIS — M50222 Other cervical disc displacement at C5-C6 level: Secondary | ICD-10-CM | POA: Insufficient documentation

## 2015-09-19 DIAGNOSIS — M5412 Radiculopathy, cervical region: Secondary | ICD-10-CM

## 2015-09-21 DIAGNOSIS — K219 Gastro-esophageal reflux disease without esophagitis: Secondary | ICD-10-CM | POA: Insufficient documentation

## 2015-09-30 ENCOUNTER — Ambulatory Visit: Payer: Self-pay

## 2015-10-22 ENCOUNTER — Ambulatory Visit (INDEPENDENT_AMBULATORY_CARE_PROVIDER_SITE_OTHER): Payer: Medicare Other | Admitting: Family Medicine

## 2015-10-22 ENCOUNTER — Encounter: Payer: Self-pay | Admitting: Family Medicine

## 2015-10-22 VITALS — BP 155/79 | HR 72 | Temp 99.0°F | Wt 151.0 lb

## 2015-10-22 DIAGNOSIS — B029 Zoster without complications: Secondary | ICD-10-CM | POA: Diagnosis not present

## 2015-10-22 MED ORDER — PREDNISONE 20 MG PO TABS: 20.0000 mg | ORAL_TABLET | Freq: Every day | ORAL | 0 refills | Status: DC

## 2015-10-22 MED ORDER — TRIAMCINOLONE ACETONIDE 0.1 % EX CREA: 1.0000 "application " | TOPICAL_CREAM | Freq: Every day | CUTANEOUS | 1 refills | Status: DC

## 2015-10-22 MED ORDER — VALACYCLOVIR HCL 1 G PO TABS: 1000.0000 mg | ORAL_TABLET | Freq: Two times a day (BID) | ORAL | 0 refills | Status: DC

## 2015-10-22 NOTE — Progress Notes (Signed)
   BP (!) 155/79   Pulse 72   Temp 99 F (37.2 C)   Wt 151 lb (68.5 kg)   SpO2 97%   BMI 25.92 kg/m    Subjective:    Patient ID: Andrea Savage, female    DOB: 1945-11-29, 70 y.o.   MRN: [id number]  HPI: Andrea Savage is a 70 y.o. female  Chief Complaint  Patient presents with  . Herpes Zoster    started around eye and left side of her face for the past month, spot started on the base of neck about 3 weeks ago. Has had shingles in the past and had the vaccine back in Jan.   Patient went to her eye doctor about 3 weeks ago for an infection that began around her left eye. She was given antibiotics and the infection slowly improved. Since Monday, she has been having what seems to be a new rash appear more down left side of neck to upper back. Swollen, blistering, sharp and tingling with some itching. States she has had shingles before and this feels very similar to previous episodes. Denies visual changes, hearing difficulties, or any other new symptoms aside from the rash. Has been putting zinc oxide on it with some cooling relief.   Relevant past medical, surgical, family and social history reviewed and updated as indicated. Interim medical history since our last visit reviewed. Allergies and medications reviewed and updated.  Review of Systems  Constitutional: Negative for chills and fever.  HENT: Negative for ear pain and hearing loss.   Eyes: Negative for pain, redness, itching and visual disturbance.  Respiratory: Negative.   Cardiovascular: Negative.   Gastrointestinal: Negative.   Musculoskeletal: Negative.   Skin: Positive for rash.  Neurological: Negative.   Psychiatric/Behavioral: Negative.     Per HPI unless specifically indicated above     Objective:    BP (!) 155/79   Pulse 72   Temp 99 F (37.2 C)   Wt 151 lb (68.5 kg)   SpO2 97%   BMI 25.92 kg/m   Wt Readings from Last 3 Encounters:  10/22/15 151 lb (68.5 kg)  06/29/15 148 lb (67.1 kg)    10/24/12 144 lb (65.3 kg)    Physical Exam  Constitutional: She is oriented to person, place, and time. She appears well-developed and well-nourished.  HENT:  Head: Atraumatic.  Eyes: Conjunctivae are normal. No scleral icterus.  Neck: Normal range of motion. Neck supple.  Cardiovascular: Normal rate and normal heart sounds.   Pulmonary/Chest: Effort normal.  Musculoskeletal: Normal range of motion.  Lymphadenopathy:    She has no cervical adenopathy.  Neurological: She is alert and oriented to person, place, and time.  Skin: Skin is warm and dry. Rash (Vesicular rash clusters on erythematous base upper left back/lower neck region) noted.  Psychiatric: She has a normal mood and affect. Her behavior is normal.  Nursing note and vitals reviewed.     Assessment & Plan:   Problem List Items Addressed This Visit    None    Visit Diagnoses    Herpes zoster    -  Primary   Prednisone burst and valtrex given. Also refilled triamcinolone as she was unsure if she had any left at home. Follow up if no improvement in 3-4 days.    Relevant Medications   valACYclovir (VALTREX) 1000 MG tablet       Follow up plan: Return if symptoms worsen or fail to improve.

## 2015-10-22 NOTE — Patient Instructions (Signed)
Follow up as needed

## 2015-11-10 ENCOUNTER — Telehealth: Payer: Self-pay

## 2015-11-10 NOTE — Telephone Encounter (Signed)
Patient called, she would like a referral for a stomach doctor in [place], [place]. Patient needs to be seen. Christan: Please get patient scheduled.

## 2015-11-11 NOTE — Telephone Encounter (Signed)
appt scheduled tomorrow

## 2015-11-12 ENCOUNTER — Encounter: Payer: Self-pay | Admitting: Family Medicine

## 2015-11-12 ENCOUNTER — Ambulatory Visit (INDEPENDENT_AMBULATORY_CARE_PROVIDER_SITE_OTHER): Payer: Medicare Other | Admitting: Family Medicine

## 2015-11-12 VITALS — BP 127/78 | HR 83 | Temp 98.5°F | Ht 64.3 in | Wt 151.0 lb

## 2015-11-12 DIAGNOSIS — B0229 Other postherpetic nervous system involvement: Secondary | ICD-10-CM

## 2015-11-12 DIAGNOSIS — R053 Chronic cough: Secondary | ICD-10-CM

## 2015-11-12 DIAGNOSIS — Z1231 Encounter for screening mammogram for malignant neoplasm of breast: Secondary | ICD-10-CM

## 2015-11-12 DIAGNOSIS — R05 Cough: Secondary | ICD-10-CM | POA: Diagnosis not present

## 2015-11-12 MED ORDER — AMITRIPTYLINE HCL 25 MG PO TABS: 25.0000 mg | ORAL_TABLET | Freq: Every day | ORAL | 3 refills | Status: DC

## 2015-11-12 NOTE — Progress Notes (Signed)
BP 127/78 (BP Location: Left Arm, Patient Position: Sitting, Cuff Size: Normal)   Pulse 83   Temp 98.5 F (36.9 C)   Ht 5' 4.3" (1.633 m)   Wt 151 lb (68.5 kg)   SpO2 97%   BMI 25.68 kg/m    Subjective:    Patient ID: Andrea Savage, female    DOB: 10-Jul-1945, 70 y.o.   MRN: [id number]  HPI: [name] is a 70 y.o. female  Chief Complaint  Patient presents with  . Referral    Chronic cough- Laureen Abrahams [facility]   COUGH- taking 1 omeprazole daily, only thing that cuts the cough is hot coffee and rum, has some pretty severe reflux that has been seen on barium swallow in the past. Has not had EGD. Dr. Candace Cruise wanted to do another test ?mannometer? Duration: chronic, has been going on  Circumstances of initial development of cough: unknown Cough severity: moderate Cough description: productive of clear fluid Aggravating factors:  talking, laughing, being in the car Alleviating factors: hot coffee and rum Status:  worse Treatments attempted: omeprazole, cold/sinus, mucinex, cough syrup, antibiotics, albuterol and nasal CCS Wheezing: no Shortness of breath: no- only after the cough Chest pain: no Chest tightness:no Nasal congestion: no Runny nose: no Postnasal drip: no Frequent throat clearing or swallowing: yes Hemoptysis: no Fevers: no Night sweats: no Weight loss: no Heartburn: yes Recent foreign travel: no Tuberculosis contacts: no  Relevant past medical, surgical, family and social history reviewed and updated as indicated. Interim medical history since our last visit reviewed. Allergies and medications reviewed and updated.  Review of Systems  Constitutional: Negative.   Respiratory: Positive for cough and choking. Negative for apnea, chest tightness, shortness of breath, wheezing and stridor.   Cardiovascular: Negative.   Gastrointestinal: Negative for abdominal distention, abdominal pain, anal bleeding, blood in stool,  constipation, diarrhea, nausea, rectal pain and vomiting.  Psychiatric/Behavioral: Negative.     Per HPI unless specifically indicated above     Objective:    BP 127/78 (BP Location: Left Arm, Patient Position: Sitting, Cuff Size: Normal)   Pulse 83   Temp 98.5 F (36.9 C)   Ht 5' 4.3" (1.633 m)   Wt 151 lb (68.5 kg)   SpO2 97%   BMI 25.68 kg/m   Wt Readings from Last 3 Encounters:  11/12/15 151 lb (68.5 kg)  10/22/15 151 lb (68.5 kg)  06/29/15 148 lb (67.1 kg)    Physical Exam  Constitutional: She is oriented to person, place, and time. She appears well-developed and well-nourished. No distress.  HENT:  Head: Normocephalic and atraumatic.  Right Ear: Hearing normal.  Left Ear: Hearing normal.  Nose: Nose normal.  Eyes: Conjunctivae and lids are normal. Right eye exhibits no discharge. Left eye exhibits no discharge. No scleral icterus.  Cardiovascular: Normal rate, regular rhythm, normal heart sounds and intact distal pulses.  Exam reveals no gallop and no friction rub.   No murmur heard. Pulmonary/Chest: Effort normal and breath sounds normal. No respiratory distress. She has no wheezes. She has no rales. She exhibits no tenderness.  Musculoskeletal: Normal range of motion.  Neurological: She is alert and oriented to person, place, and time.  Skin: Skin is warm, dry and intact. No rash noted. She is not diaphoretic. No erythema. No pallor.  Psychiatric: She has a normal mood and affect. Her speech is normal and behavior is normal. Judgment and thought content normal. Cognition and memory are normal.  Nursing note  and vitals reviewed.   Results for orders placed or performed in visit on 06/29/15  CBC with Differential/Platelet  Result Value Ref Range   WBC 4.1 3.4 - 10.8 x10E3/uL   RBC 4.15 3.77 - 5.28 x10E6/uL   Hemoglobin 13.4 11.1 - 15.9 g/dL   Hematocrit 39.6 34.0 - 46.6 %   MCV 95 79 - 97 fL   MCH 32.3 26.6 - 33.0 pg   MCHC 33.8 31.5 - 35.7 g/dL   RDW 13.0  12.3 - 15.4 %   Platelets 217 150 - 379 x10E3/uL   Neutrophils 53 %   Lymphs 35 %   Monocytes 10 %   Eos 2 %   Basos 0 %   Neutrophils Absolute 2.2 1.4 - 7.0 x10E3/uL   Lymphocytes Absolute 1.5 0.7 - 3.1 x10E3/uL   Monocytes Absolute 0.4 0.1 - 0.9 x10E3/uL   EOS (ABSOLUTE) 0.1 0.0 - 0.4 x10E3/uL   Basophils Absolute 0.0 0.0 - 0.2 x10E3/uL   Immature Granulocytes 0 %   Immature Grans (Abs) 0.0 0.0 - 0.1 x10E3/uL  Comprehensive metabolic panel  Result Value Ref Range   Glucose 83 65 - 99 mg/dL   BUN 17 8 - 27 mg/dL   Creatinine, Ser 0.75 0.57 - 1.00 mg/dL   GFR calc non Af Amer 82 >59 mL/min/1.73   GFR calc Af Amer 94 >59 mL/min/1.73   BUN/Creatinine Ratio 23 12 - 28   Sodium 138 134 - 144 mmol/L   Potassium 4.3 3.5 - 5.2 mmol/L   Chloride 98 96 - 106 mmol/L   CO2 25 18 - 29 mmol/L   Calcium 9.2 8.7 - 10.3 mg/dL   Total Protein 6.6 6.0 - 8.5 g/dL   Albumin 4.0 3.6 - 4.8 g/dL   Globulin, Total 2.6 1.5 - 4.5 g/dL   Albumin/Globulin Ratio 1.5 1.2 - 2.2   Bilirubin Total 0.2 0.0 - 1.2 mg/dL   Alkaline Phosphatase 72 39 - 117 IU/L   AST 18 0 - 40 IU/L   ALT 16 0 - 32 IU/L  Lipid Panel w/o Chol/HDL Ratio  Result Value Ref Range   Cholesterol, Total 214 (H) 100 - 199 mg/dL   Triglycerides 196 (H) 0 - 149 mg/dL   HDL 65 >39 mg/dL   VLDL Cholesterol Cal 39 5 - 40 mg/dL   LDL Calculated 110 (H) 0 - 99 mg/dL  Thyroid Panel With TSH  Result Value Ref Range   TSH 3.180 0.450 - 4.500 uIU/mL   T4, Total 7.8 4.5 - 12.0 ug/dL   T3 Uptake Ratio 30 24 - 39 %   Free Thyroxine Index 2.3 1.2 - 4.9      Assessment & Plan:   Problem List Items Addressed This Visit      Nervous and Auditory   Post herpetic neuralgia    Will start her on amitriptyline. Will see how she does in 1 month at follow up.       Relevant Medications   amitriptyline (ELAVIL) 25 MG tablet    Other Visit Diagnoses    Chronic cough    -  Primary   Would like to get 2nd opinion on GERD, as not better. Referral  generated today. Will start 40mg  of omeprazole until she sees them.    Relevant Orders   Ambulatory referral to Gastroenterology   Encounter for screening mammogram for breast cancer       Mammogram ordered today.       Follow up plan: Return As  scheduled.

## 2015-11-12 NOTE — Assessment & Plan Note (Signed)
Will start her on amitriptyline. Will see how she does in 1 month at follow up.

## 2015-11-27 ENCOUNTER — Ambulatory Visit: Payer: Medicare Other | Admitting: Family Medicine

## 2015-12-02 ENCOUNTER — Ambulatory Visit: Payer: Medicare Other

## 2015-12-10 ENCOUNTER — Other Ambulatory Visit: Payer: Self-pay | Admitting: Unknown Physician Specialty

## 2015-12-10 DIAGNOSIS — H9122 Sudden idiopathic hearing loss, left ear: Secondary | ICD-10-CM

## 2015-12-14 ENCOUNTER — Other Ambulatory Visit: Payer: Self-pay | Admitting: Unknown Physician Specialty

## 2015-12-14 ENCOUNTER — Ambulatory Visit
Admission: RE | Admit: 2015-12-14 | Discharge: 2015-12-14 | Disposition: A | Discharge status: Home / Self Care | Payer: Medicare Other | Source: Ambulatory Visit | Attending: Unknown Physician Specialty | Admitting: Unknown Physician Specialty

## 2015-12-14 DIAGNOSIS — H9123 Sudden idiopathic hearing loss, bilateral: Secondary | ICD-10-CM | POA: Insufficient documentation

## 2015-12-14 DIAGNOSIS — H9122 Sudden idiopathic hearing loss, left ear: Secondary | ICD-10-CM

## 2015-12-14 DIAGNOSIS — H903 Sensorineural hearing loss, bilateral: Secondary | ICD-10-CM | POA: Insufficient documentation

## 2015-12-14 LAB — POCT I-STAT CREATININE: CREATININE: 0.9 mg/dL (ref 0.44–1.00)

## 2015-12-14 MED ORDER — GADOBENATE DIMEGLUMINE 529 MG/ML IV SOLN
13.0000 mL | Freq: Once | INTRAVENOUS | Status: AC | PRN
  Administered 2015-12-14: 13 mL via INTRAVENOUS

## 2015-12-29 ENCOUNTER — Ambulatory Visit (INDEPENDENT_AMBULATORY_CARE_PROVIDER_SITE_OTHER): Payer: Medicare Other | Admitting: Family Medicine

## 2015-12-29 ENCOUNTER — Encounter: Payer: Self-pay | Admitting: Family Medicine

## 2015-12-29 VITALS — BP 116/80 | HR 82 | Temp 99.1°F | Wt 157.4 lb

## 2015-12-29 DIAGNOSIS — R05 Cough: Secondary | ICD-10-CM | POA: Diagnosis not present

## 2015-12-29 DIAGNOSIS — H90A22 Sensorineural hearing loss, unilateral, left ear, with restricted hearing on the contralateral side: Secondary | ICD-10-CM | POA: Insufficient documentation

## 2015-12-29 DIAGNOSIS — F3341 Major depressive disorder, recurrent, in partial remission: Secondary | ICD-10-CM

## 2015-12-29 DIAGNOSIS — R053 Chronic cough: Secondary | ICD-10-CM

## 2015-12-29 DIAGNOSIS — B0229 Other postherpetic nervous system involvement: Secondary | ICD-10-CM

## 2015-12-29 MED ORDER — AMITRIPTYLINE HCL 25 MG PO TABS: 25.0000 mg | ORAL_TABLET | Freq: Every day | ORAL | 1 refills | Status: DC

## 2015-12-29 MED ORDER — CITALOPRAM HYDROBROMIDE 20 MG PO TABS: ORAL_TABLET | ORAL | 1 refills | Status: DC

## 2015-12-29 NOTE — Assessment & Plan Note (Signed)
Improved on her amitriptyline. Continue for 6 months. Call with any concerns.

## 2015-12-29 NOTE — Assessment & Plan Note (Signed)
Very frustrated. Continue to follow with ENT. Call with any concerns.

## 2015-12-29 NOTE — Assessment & Plan Note (Signed)
Stable. Does not want to change her medication at this time. Call with any concerns.

## 2015-12-29 NOTE — Progress Notes (Signed)
BP 116/80   Pulse 82   Temp 99.1 F (37.3 C)   Wt 157 lb 6.4 oz (71.4 kg)   SpO2 98%   BMI 26.77 kg/m    Subjective:    Patient ID: Andrea Savage, female    DOB: 02-14-1946, 70 y.o.   MRN: [id number]  HPI: Andrea Savage is a 70 y.o. female  Chief Complaint  Patient presents with  . Follow-up    Patient is here for a 6 month follow up   Post-herpetic neuralgia- amitriptyline is helping but it gives her quite a hang over when she takes it. Still having a bit of a tingling there, but no more rashes.   Hearing loss- sudden when she woke up in the AM about 3 weeks ago. After 2 days got in to see Dr. Tami Ribas and saw his office that day. Had an MRI the next day. Was on the prednisone, which didn't clear it all up. Has an appointment with audiology tomorrow. Has been having some vertigo with this, but resolved now. No more pressure in her ear. They are now trying to decide if she needs a new hearing aide. Has been trying to avoid being in groups because she can't participate in conversations.   COUGH- went away with the prednisone. Seeing GI in December. No better with the higher dose of prednisone. Mucous relief from Roses seems to help the best.   DEPRESSION  Relevant past medical, surgical, family and social history reviewed and updated as indicated. Interim medical history since our last visit reviewed. Allergies and medications reviewed and updated.  Review of Systems  Constitutional: Negative.   HENT: Positive for hearing loss. Negative for congestion, dental problem, drooling, ear discharge, ear pain, facial swelling, mouth sores, nosebleeds, postnasal drip, rhinorrhea, sinus pressure, sneezing, sore throat, tinnitus, trouble swallowing and voice change.   Respiratory: Positive for cough. Negative for apnea, choking, chest tightness, shortness of breath, wheezing and stridor.   Cardiovascular: Negative.   Psychiatric/Behavioral: Negative.     Per HPI unless  specifically indicated above     Objective:    BP 116/80   Pulse 82   Temp 99.1 F (37.3 C)   Wt 157 lb 6.4 oz (71.4 kg)   SpO2 98%   BMI 26.77 kg/m   Wt Readings from Last 3 Encounters:  12/29/15 157 lb 6.4 oz (71.4 kg)  11/12/15 151 lb (68.5 kg)  10/22/15 151 lb (68.5 kg)    Physical Exam  Constitutional: She is oriented to person, place, and time. She appears well-developed and well-nourished. No distress.  HENT:  Head: Normocephalic and atraumatic.  Right Ear: Hearing normal.  Left Ear: Hearing normal.  Nose: Nose normal.  Eyes: Conjunctivae and lids are normal. Right eye exhibits no discharge. Left eye exhibits no discharge. No scleral icterus.  Cardiovascular: Normal rate, regular rhythm, normal heart sounds and intact distal pulses.  Exam reveals no gallop and no friction rub.   No murmur heard. Pulmonary/Chest: Effort normal and breath sounds normal. No respiratory distress. She has no wheezes. She has no rales. She exhibits no tenderness.  Musculoskeletal: Normal range of motion.  Neurological: She is alert and oriented to person, place, and time.  Skin: Skin is warm, dry and intact. No rash noted. No erythema. No pallor.  Psychiatric: She has a normal mood and affect. Her speech is normal and behavior is normal. Judgment and thought content normal. Cognition and memory are normal.  Nursing note and vitals reviewed.  Results for orders placed or performed during the hospital encounter of 12/14/15  I-STAT creatinine  Result Value Ref Range   Creatinine, Ser 0.90 0.44 - 1.00 mg/dL      Assessment & Plan:   Problem List Items Addressed This Visit      Nervous and Auditory   Post herpetic neuralgia    Improved on her amitriptyline. Continue for 6 months. Call with any concerns.       Relevant Medications   amitriptyline (ELAVIL) 25 MG tablet   citalopram (CELEXA) 20 MG tablet   Sensorineural hearing loss (SNHL) of left ear with restricted hearing of right  ear    Very frustrated. Continue to follow with ENT. Call with any concerns.         Other   Depression    Stable. Does not want to change her medication at this time. Call with any concerns.       Relevant Medications   amitriptyline (ELAVIL) 25 MG tablet   citalopram (CELEXA) 20 MG tablet    Other Visit Diagnoses    Chronic cough    -  Primary   Seeing GI in December. No change with her higher dose omeprazole. OK to go back to the 20mg . Call with any concerns.        Follow up plan: Return in about 6 months (around 06/28/2016) for Wellness/Physical.

## 2015-12-30 ENCOUNTER — Ambulatory Visit: Payer: Medicare Other

## 2016-01-18 ENCOUNTER — Ambulatory Visit
Admission: RE | Admit: 2016-01-18 | Discharge: 2016-01-18 | Disposition: A | Discharge status: Home / Self Care | Payer: Medicare Other | Source: Ambulatory Visit | Attending: Family Medicine | Admitting: Family Medicine

## 2016-01-18 DIAGNOSIS — Z1231 Encounter for screening mammogram for malignant neoplasm of breast: Secondary | ICD-10-CM | POA: Insufficient documentation

## 2016-02-02 ENCOUNTER — Ambulatory Visit (INDEPENDENT_AMBULATORY_CARE_PROVIDER_SITE_OTHER): Payer: Medicare Other | Admitting: Family Medicine

## 2016-02-02 ENCOUNTER — Encounter: Payer: Self-pay | Admitting: Family Medicine

## 2016-02-02 VITALS — BP 121/77 | HR 93 | Temp 98.3°F | Wt 159.0 lb

## 2016-02-02 DIAGNOSIS — R05 Cough: Secondary | ICD-10-CM | POA: Diagnosis not present

## 2016-02-02 DIAGNOSIS — R059 Cough, unspecified: Secondary | ICD-10-CM

## 2016-02-02 MED ORDER — BENZONATATE 100 MG PO CAPS: 200.0000 mg | ORAL_CAPSULE | Freq: Three times a day (TID) | ORAL | 0 refills | Status: DC | PRN

## 2016-02-02 MED ORDER — ALBUTEROL SULFATE HFA 108 (90 BASE) MCG/ACT IN AERS: 2.0000 | INHALATION_SPRAY | Freq: Four times a day (QID) | RESPIRATORY_TRACT | 6 refills | Status: DC | PRN

## 2016-02-02 MED ORDER — HYDROCOD POLST-CPM POLST ER 10-8 MG/5ML PO SUER: 5.0000 mL | Freq: Two times a day (BID) | ORAL | 0 refills | Status: DC | PRN

## 2016-02-02 NOTE — Patient Instructions (Signed)
Follow up as needed

## 2016-02-02 NOTE — Progress Notes (Signed)
   BP 121/77   Pulse 93   Temp 98.3 F (36.8 C)   Wt 159 lb (72.1 kg)   SpO2 97%   BMI 27.04 kg/m    Subjective:    Patient ID: Andrea Savage, female    DOB: [birth year], 70 y.o.   MRN: [id number]  HPI: Andrea Savage is a 70 y.o. female  Chief Complaint  Patient presents with  . URI    since Friday; dry cough. Sore throat, no sinus drainage. No chest congestion, no known fever,    Patient presents with newly worsening cough symptoms since Friday. States her husband had a cold virus and now she feels like she is getting it. Not much drainage or sore throat, mostly just worsening cough that feels a bit different than the chronic cough that is being worked up with GI currently. Has been taking alka seltzer cold and flu, also trying essential oils. Denies fever, chills, CP. Body feels sore from all the coughing, and she sometimes feels short of breath during coughing fits.   Relevant past medical, surgical, family and social history reviewed and updated as indicated. Interim medical history since our last visit reviewed. Allergies and medications reviewed and updated.  Review of Systems  Constitutional: Negative.   HENT: Negative.   Eyes: Negative.   Respiratory: Positive for cough and shortness of breath. Negative for wheezing.   Cardiovascular: Negative.   Gastrointestinal: Negative.   Genitourinary: Negative.   Musculoskeletal: Negative.   Neurological: Negative.   Psychiatric/Behavioral: Negative.     Per HPI unless specifically indicated above     Objective:    BP 121/77   Pulse 93   Temp 98.3 F (36.8 C)   Wt 159 lb (72.1 kg)   SpO2 97%   BMI 27.04 kg/m   Wt Readings from Last 3 Encounters:  02/02/16 159 lb (72.1 kg)  12/29/15 157 lb 6.4 oz (71.4 kg)  11/12/15 151 lb (68.5 kg)    Physical Exam  Constitutional: She is oriented to person, place, and time. She appears well-developed and well-nourished.  HENT:  Head: Atraumatic.  Right Ear: External  ear normal.  Left Ear: External ear normal.  Nose: Nose normal.  Oropharynx erythematous  Eyes: Conjunctivae are normal. Pupils are equal, round, and reactive to light.  Neck: Normal range of motion. Neck supple.  Cardiovascular: Normal rate and normal heart sounds.   Pulmonary/Chest: Effort normal and breath sounds normal. She has no wheezes. She has no rales.  Musculoskeletal: Normal range of motion.  Neurological: She is alert and oriented to person, place, and time.  Skin: Skin is warm and dry.  Psychiatric: She has a normal mood and affect. Her behavior is normal.  Nursing note and vitals reviewed.     Assessment & Plan:   Problem List Items Addressed This Visit    None    Visit Diagnoses    Cough    -  Primary   Will treat with tessalon perles, tussionex, and albuterol as needed. Continue essential oils and bed elevation for sleep.        Follow up plan: Return if symptoms worsen or fail to improve.

## 2016-03-10 ENCOUNTER — Ambulatory Visit (INDEPENDENT_AMBULATORY_CARE_PROVIDER_SITE_OTHER): Payer: Medicare Other | Admitting: Family Medicine

## 2016-03-10 ENCOUNTER — Encounter: Payer: Self-pay | Admitting: Family Medicine

## 2016-03-10 VITALS — BP 130/85 | HR 81 | Temp 98.2°F | Wt 158.2 lb

## 2016-03-10 DIAGNOSIS — R059 Cough, unspecified: Secondary | ICD-10-CM

## 2016-03-10 DIAGNOSIS — M797 Fibromyalgia: Secondary | ICD-10-CM | POA: Diagnosis not present

## 2016-03-10 DIAGNOSIS — F3341 Major depressive disorder, recurrent, in partial remission: Secondary | ICD-10-CM | POA: Diagnosis not present

## 2016-03-10 DIAGNOSIS — R05 Cough: Secondary | ICD-10-CM | POA: Diagnosis not present

## 2016-03-10 DIAGNOSIS — B029 Zoster without complications: Secondary | ICD-10-CM | POA: Diagnosis not present

## 2016-03-10 DIAGNOSIS — B0229 Other postherpetic nervous system involvement: Secondary | ICD-10-CM

## 2016-03-10 MED ORDER — CITALOPRAM HYDROBROMIDE 40 MG PO TABS: ORAL_TABLET | ORAL | 1 refills | Status: DC

## 2016-03-10 MED ORDER — BENZONATATE 100 MG PO CAPS: 200.0000 mg | ORAL_CAPSULE | Freq: Three times a day (TID) | ORAL | 3 refills | Status: DC | PRN

## 2016-03-10 MED ORDER — VALACYCLOVIR HCL 1 G PO TABS: 1000.0000 mg | ORAL_TABLET | Freq: Three times a day (TID) | ORAL | 0 refills | Status: DC

## 2016-03-10 NOTE — Assessment & Plan Note (Signed)
Had a flare over christmas. Will have her restart her gabapentin and increase her celexa. Recheck 4 weeks.

## 2016-03-10 NOTE — Assessment & Plan Note (Signed)
Advised her to go back on her gabapentin. Call with any concerns.

## 2016-03-10 NOTE — Assessment & Plan Note (Signed)
Would like to increase her celexa. Will increase to 40mg  and recheck in 1 month.

## 2016-03-10 NOTE — Progress Notes (Signed)
BP 130/85 (BP Location: Left Arm, Patient Position: Sitting, Cuff Size: Normal)   Pulse 81   Temp 98.2 F (36.8 C)   Wt 158 lb 3.2 oz (71.8 kg)   SpO2 96%   BMI 26.90 kg/m    Subjective:    Patient ID: Andrea Savage, female    DOB: 1945/12/08, 71 y.o.   MRN: [id number]  HPI: Andrea Savage is a 72 y.o. female  Chief Complaint  Patient presents with  . Herpes Zoster    left shoulder   RASH Duration:  Since new years eve  Location: L shoulder  Itching: yes Burning: yes Redness: yes Oozing: yes Scaling: yes Blisters: yes Painful: no Fevers: no Change in detergents/soaps/personal care products: no Recent illness: no Recent travel:no History of same: yes Context: worse Alleviating factors: nothing Treatments attempted:nothing Shortness of breath: no  Throat/tongue swelling: no Myalgias/arthralgias: yes  Cough no better- seeing GI and happy with them. Needs a refill of her tessalon perles.  Had a fibromyalgia flare over Christmas. Felt terrible. Body hurting all over. She thought it was due to her gabapentin, so she stopped taking it. Was taking some old tramadol to help with the pain. Better now.  Mood is still not right. Would like to go up on her celexa.   Relevant past medical, surgical, family and social history reviewed and updated as indicated. Interim medical history since our last visit reviewed. Allergies and medications reviewed and updated.  Review of Systems  Constitutional: Negative.   Respiratory: Positive for cough. Negative for apnea, choking, chest tightness, shortness of breath, wheezing and stridor.   Cardiovascular: Negative.   Musculoskeletal: Positive for myalgias. Negative for arthralgias, back pain, gait problem, joint swelling, neck pain and neck stiffness.  Skin: Positive for rash. Negative for color change, pallor and wound.  Psychiatric/Behavioral: Negative.     Per HPI unless specifically indicated above     Objective:      BP 130/85 (BP Location: Left Arm, Patient Position: Sitting, Cuff Size: Normal)   Pulse 81   Temp 98.2 F (36.8 C)   Wt 158 lb 3.2 oz (71.8 kg)   SpO2 96%   BMI 26.90 kg/m   Wt Readings from Last 3 Encounters:  03/10/16 158 lb 3.2 oz (71.8 kg)  02/02/16 159 lb (72.1 kg)  12/29/15 157 lb 6.4 oz (71.4 kg)    Physical Exam  Constitutional: She is oriented to person, place, and time. She appears well-developed and well-nourished. No distress.  HENT:  Head: Normocephalic and atraumatic.  Right Ear: Hearing normal.  Left Ear: Hearing normal.  Nose: Nose normal.  Eyes: Conjunctivae and lids are normal. Right eye exhibits no discharge. Left eye exhibits no discharge. No scleral icterus.  Cardiovascular: Normal rate, regular rhythm, normal heart sounds and intact distal pulses.  Exam reveals no gallop and no friction rub.   No murmur heard. Pulmonary/Chest: Effort normal and breath sounds normal. No respiratory distress. She has no wheezes. She has no rales. She exhibits no tenderness.  Musculoskeletal: Normal range of motion.  Neurological: She is alert and oriented to person, place, and time.  Skin: Skin is warm, dry and intact. Rash (vesicular oozing rash on back ) noted. No erythema. No pallor.     Psychiatric: She has a normal mood and affect. Her speech is normal and behavior is normal. Judgment and thought content normal. Cognition and memory are normal.  Nursing note and vitals reviewed.   Results for orders placed or  performed during the hospital encounter of 12/14/15  I-STAT creatinine  Result Value Ref Range   Creatinine, Ser 0.90 0.44 - 1.00 mg/dL      Assessment & Plan:   Problem List Items Addressed This Visit      Nervous and Auditory   Post herpetic neuralgia    Advised her to go back on her gabapentin. Call with any concerns.       Relevant Medications   citalopram (CELEXA) 40 MG tablet     Other   Depression    Would like to increase her celexa. Will  increase to 40mg  and recheck in 1 month.       Relevant Medications   citalopram (CELEXA) 40 MG tablet   Fibromyalgia - Primary    Had a flare over christmas. Will have her restart her gabapentin and increase her celexa. Recheck 4 weeks.        Other Visit Diagnoses    Cough       Continue to follow with GI. Needs refill of her benzonate. Rx given today.   Herpes zoster without complication       Will treat her with valtrex. Call with any concerns. If continues with repeated outbreaks, consider referral to ID.   Relevant Medications   valACYclovir (VALTREX) 1000 MG tablet       Follow up plan: Return in about 4 weeks (around 04/07/2016) for Follow up fibro, cough, mood, shingles.

## 2016-04-08 ENCOUNTER — Ambulatory Visit (INDEPENDENT_AMBULATORY_CARE_PROVIDER_SITE_OTHER): Payer: Medicare Other | Admitting: Family Medicine

## 2016-04-08 VITALS — BP 150/87 | HR 64 | Temp 98.4°F | Ht 66.0 in | Wt 174.0 lb

## 2016-04-08 DIAGNOSIS — M797 Fibromyalgia: Secondary | ICD-10-CM | POA: Diagnosis not present

## 2016-04-08 DIAGNOSIS — B350 Tinea barbae and tinea capitis: Secondary | ICD-10-CM | POA: Diagnosis not present

## 2016-04-08 DIAGNOSIS — R030 Elevated blood-pressure reading, without diagnosis of hypertension: Secondary | ICD-10-CM

## 2016-04-08 DIAGNOSIS — F3341 Major depressive disorder, recurrent, in partial remission: Secondary | ICD-10-CM

## 2016-04-08 DIAGNOSIS — B0229 Other postherpetic nervous system involvement: Secondary | ICD-10-CM

## 2016-04-08 DIAGNOSIS — L299 Pruritus, unspecified: Secondary | ICD-10-CM

## 2016-04-08 MED ORDER — KETOCONAZOLE 1 % EX SHAM: 1.0000 "application " | MEDICATED_SHAMPOO | CUTANEOUS | 1 refills | Status: DC

## 2016-04-08 NOTE — Assessment & Plan Note (Signed)
Continue amitriptyline. Call with any concerns.

## 2016-04-08 NOTE — Progress Notes (Signed)
BP (!) 150/87   Pulse 64   Temp 98.4 F (36.9 C) (Oral)   Ht 5\' 6"  (1.676 m)   Wt 174 lb (78.9 kg)   SpO2 95%   BMI 28.08 kg/m    Subjective:    Patient ID: Andrea Savage, female    DOB: March 28, 1945, 71 y.o.   MRN: [id number]  HPI: Andrea Savage is a 71 y.o. female  Chief Complaint  Patient presents with  . Follow-up  . Hair/Scalp Problem   Did not go up on her gabapentin. Did go up on her celexa and is noticing a difference. Has started a supplement for trace minerals and thinks that it has made all the difference. She is feeling much better and feels like she's about 80% better. She has not had any fibro flares  She is concerned about weakness in her legs. Concerned that she might have MS or Myesthinia. She notes that her legs feel really heavy. She does not want to see neurology right now.   RASH- Still has some itching and raised area on her L shoulder into her R scalp. She has been really dry and very itchy all over Duration:  chronic  Location: generalized  Itching: yes Burning: no Redness: yes Oozing: no Scaling: yes Blisters: no Painful: no Fevers: no Change in detergents/soaps/personal care products: no Recent illness: no Recent travel:no History of same: yes Context: stable Alleviating factors: nothing Treatments attempted:lotion/moisturizer and nothing Shortness of breath: no  Throat/tongue swelling: no Myalgias/arthralgias: no  Relevant past medical, surgical, family and social history reviewed and updated as indicated. Interim medical history since our last visit reviewed. Allergies and medications reviewed and updated.  Review of Systems  Constitutional: Negative.   Respiratory: Negative.   Cardiovascular: Negative.   Skin: Positive for rash. Negative for color change, pallor and wound.  Psychiatric/Behavioral: Negative.     Per HPI unless specifically indicated above     Objective:    BP (!) 150/87   Pulse 64   Temp 98.4 F  (36.9 C) (Oral)   Ht 5\' 6"  (1.676 m)   Wt 174 lb (78.9 kg)   SpO2 95%   BMI 28.08 kg/m   Wt Readings from Last 3 Encounters:  04/08/16 174 lb (78.9 kg)  03/10/16 158 lb 3.2 oz (71.8 kg)  02/02/16 159 lb (72.1 kg)    Physical Exam  Constitutional: She is oriented to person, place, and time. She appears well-developed and well-nourished. No distress.  HENT:  Head: Normocephalic and atraumatic.  Right Ear: Hearing normal.  Left Ear: Hearing normal.  Nose: Nose normal.  Eyes: Conjunctivae and lids are normal. Right eye exhibits no discharge. Left eye exhibits no discharge. No scleral icterus.  Cardiovascular: Normal rate, regular rhythm, normal heart sounds and intact distal pulses.  Exam reveals no gallop and no friction rub.   No murmur heard. Pulmonary/Chest: Effort normal and breath sounds normal. No respiratory distress. She has no wheezes. She has no rales. She exhibits no tenderness.  Musculoskeletal: Normal range of motion.  Neurological: She is alert and oriented to person, place, and time.  Skin: Skin is warm, dry and intact. Rash (erythematous, raised scaley rash on the R upper neck and patches throughout body, very dry skin) noted. No erythema. No pallor.  Psychiatric: She has a normal mood and affect. Her speech is normal and behavior is normal. Judgment and thought content normal. Cognition and memory are normal.  Nursing note and vitals reviewed.  Results for orders placed or performed during the hospital encounter of 12/14/15  I-STAT creatinine  Result Value Ref Range   Creatinine, Ser 0.90 0.44 - 1.00 mg/dL      Assessment & Plan:   Problem List Items Addressed This Visit      Nervous and Auditory   Post herpetic neuralgia    Continue amitriptyline. Call with any concerns.       Relevant Medications   amitriptyline (ELAVIL) 25 MG tablet   gabapentin (NEURONTIN) 100 MG capsule     Other   Depression   Relevant Medications   amitriptyline (ELAVIL) 25 MG  tablet   Fibromyalgia    Improved with her celexa and trace mineral supplement. Continue to monitor. Call with any concerns.        Other Visit Diagnoses    Tinea capitis    -  Primary   Will treat with ketoconazole shampoo. Call if not getting better or getting worse.    Relevant Medications   KETOCONAZOLE, TOPICAL, 1 % SHAM (Start on 04/11/2016)   Itching       Gentle skin cleansing, start cetaphil, Will treat with ketoconazole shampoo. Call if not getting better or getting worse.    Elevated BP without diagnosis of hypertension       Work on [employer]. Call with any concerns.        Follow up plan: Return 6-8 weeks.

## 2016-04-08 NOTE — Patient Instructions (Addendum)
Myasthenia Gravis Introduction Myasthenia gravis (MG) means severe weakness. It is a long-term (chronic) condition that causes weakness in the muscles you can control (voluntary muscles). MG can affect any voluntary muscle. The muscles most often affected are the ones that control:  Eye movement.  Facial movements.  Swallowing. MG is an autoimmune disease, which means that your body's defense system (immune system) attacks healthy parts of your body instead of germs and other things that make you sick. When you have MG, your immune system makes proteins (antibodies) that block the chemical (acetylcholine) your body needs to send nerve signals to your muscles. This causes muscle weakness. What are the causes? The exact cause of MG is unknown. One possible cause is an enlarged thymus gland, which is located under your breastbone. What are the signs or symptoms? The earliest symptom of MG is muscle weakness that gets worse with activity and gets better after rest. Other symptoms of MG may include:  Drooping eyelids.  Double vision.  Loss of facial expression.  Trouble chewing and swallowing.  Slurred speech.  A waddling walk.  Weakness of the arms, hands, and legs. Trouble breathing is the most dangerous symptom of MG. Sudden and severe difficulty breathing (myasthenic crisis) may require emergency breathing support. This symptom sometimes happens after:  Infection.  Fever.  Drug reaction. How is this diagnosed? It can be hard to diagnose MG because muscle weakness is a common symptom in many conditions. Your health care provider will do a physical exam. You may also have tests that will help make a diagnosis. These may include:  A blood test.  A test using the medicine edrophonium. This medicine increases muscle strength by slowing the breakdown of acetylcholine.  Tests to measure nerve conduction to muscle (electromyography).  An imaging study of the chest (CT or MRI). How  is this treated? Treatment can improve muscle strength. Sometimes symptoms of MG go away for a while (remission) and you can stop treatment. Possible treatments include:  Medicine.  Removal of the thymus gland (thymectomy). This may result in a long remission for some people. Follow these instructions at home:  Take medicines only as directed by your health care provider.  Get plenty of rest to conserve your energy.  Take frequent breaks to rest your eyes.  Maintain a healthy diet and a healthy weight.  Do not use any tobacco products including cigarettes, chewing tobacco, or electronic cigarettes. If you need help quitting, ask your health care provider.  Keep all follow-up visits as directed by your health care provider. This is important. Contact a health care provider if:  Your symptoms get worse after a fever or infection.  You have a reaction to a medicine you are taking.  Your symptoms change or get worse. Get help right away if: You have trouble breathing. This information is not intended to replace advice given to you by your health care provider. Make sure you discuss any questions you have with your health care provider. Document Released: 05/30/2000 Document Revised: 07/30/2015 Document Reviewed: 04/24/2013  2017 Elsevier Multiple Sclerosis Multiple sclerosis (MS) is a disease of the central nervous system. It leads to the loss of the insulating covering of the nerves (myelin sheath) of your brain. When this happens, brain signals do not get sent properly or may not get sent at all. The age of onset of MS varies. What are the causes? The cause of MS is unknown. However, it is more common in the [place] than  in the [place]. What increases the risk? There is a higher number of women with MS than men. MS is not an illness that is passed down to you from your family members (inherited). However, your risk of MS is higher if you have a  relative with MS. What are the signs or symptoms? The symptoms of MS occur in episodes or attacks. These attacks may last weeks to months. There may be long periods of almost no symptoms between attacks. The symptoms of MS vary. This is because of the many different ways it affects the central nervous system. The main symptoms of MS include:  Vision problems and eye pain.  Numbness.  Weakness.  Inability to move your arms, hands, feet, or legs (paralysis).  Balance problems.  Tremors. How is this diagnosed? Your health care provider can diagnose MS with the help of imaging exams and lab tests. These may include specialized X-ray exams and spinal fluid tests. The best imaging exam to confirm a diagnosis of MS is an MRI. How is this treated? There is no known cure for MS, but there are medicines that can decrease the number and frequency of attacks. Steroids are often used for short-term relief. Physical and occupational therapy may also help. There are also many new alternative or complementary treatments available to help control the symptoms of MS. Ask your health care provider if any of these other options are right for you. Follow these instructions at home:  Take medicines as directed by your health care provider.  Exercise as directed by your health care provider. Contact a health care provider if: You begin to feel depressed. Get help right away if:  You develop paralysis.  You have problems with bladder, bowel, or sexual function.  You develop mental changes, such as forgetfulness or mood swings.  You have a period of uncontrolled movements (seizure). This information is not intended to replace advice given to you by your health care provider. Make sure you discuss any questions you have with your health care provider. Document Released: 02/19/2000 Document Revised: 07/30/2015 Document Reviewed: 10/29/2012 Elsevier Interactive Patient Education  2017 [place] DASH stands for "Dietary Approaches to Stop Hypertension." The DASH eating plan is a healthy eating plan that has been shown to reduce high blood pressure (hypertension). Additional health benefits may include reducing the risk of type 2 diabetes mellitus, heart disease, and stroke. The DASH eating plan may also help with weight loss. What do I need to know about the DASH eating plan? For the DASH eating plan, you will follow these general guidelines:  Choose foods with less than 150 milligrams of sodium per serving (as listed on the food label).  Use salt-free seasonings or herbs instead of table salt or sea salt.  Check with your health care provider or pharmacist before using salt substitutes.  Eat lower-sodium products. These are often labeled as "low-sodium" or "no salt added."  Eat fresh foods. Avoid eating a lot of canned foods.  Eat more vegetables, fruits, and low-fat dairy products.  Choose whole grains. Look for the word "whole" as the first word in the ingredient list.  Choose fish and skinless chicken or [place] more often than red meat. Limit fish, poultry, and meat to 6 oz (170 g) each day.  Limit sweets, desserts, sugars, and sugary drinks.  Choose heart-healthy fats.  Eat more home-cooked food and less restaurant, buffet, and fast food.  Limit fried foods.  Do not fry  foods. Lacinda Axon foods using methods such as baking, boiling, grilling, and broiling instead.  When eating at a restaurant, ask that your food be prepared with less salt, or no salt if possible. What foods can I eat? Seek help from a dietitian for individual calorie needs. Grains  Whole grain or whole wheat bread. Brown rice. Whole grain or whole wheat pasta. Quinoa, bulgur, and whole grain cereals. Low-sodium cereals. Corn or whole wheat flour tortillas. Whole grain cornbread. Whole grain crackers. Low-sodium crackers. Vegetables  Fresh or frozen vegetables (raw, steamed, roasted, or  grilled). Low-sodium or reduced-sodium tomato and vegetable juices. Low-sodium or reduced-sodium tomato sauce and paste. Low-sodium or reduced-sodium canned vegetables. Fruits  All fresh, canned (in natural juice), or frozen fruits. Meat and Other Protein Products  Ground beef (85% or leaner), grass-fed beef, or beef trimmed of fat. Skinless chicken or [place]. Ground chicken or [place]. Pork trimmed of fat. All fish and seafood. Eggs. Dried beans, peas, or lentils. Unsalted nuts and seeds. Unsalted canned beans. Dairy  Low-fat dairy products, such as skim or 1% milk, 2% or reduced-fat cheeses, low-fat ricotta or cottage cheese, or plain low-fat yogurt. Low-sodium or reduced-sodium cheeses. Fats and Oils  Tub margarines without trans fats. Light or reduced-fat mayonnaise and salad dressings (reduced sodium). Avocado. Safflower, olive, or canola oils. Natural peanut or almond butter. Other  Unsalted popcorn and pretzels. The items listed above may not be a complete list of recommended foods or beverages. Contact your dietitian for more options.  What foods are not recommended? Grains  White bread. White pasta. White rice. Refined cornbread. Bagels and croissants. Crackers that contain trans fat. Vegetables  Creamed or fried vegetables. Vegetables in a cheese sauce. Regular canned vegetables. Regular canned tomato sauce and paste. Regular tomato and vegetable juices. Fruits  Canned fruit in light or heavy syrup. Fruit juice. Meat and Other Protein Products  Fatty cuts of meat. Ribs, chicken wings, bacon, sausage, bologna, salami, chitterlings, fatback, hot dogs, bratwurst, and packaged luncheon meats. Salted nuts and seeds. Canned beans with salt. Dairy  Whole or 2% milk, cream, half-and-half, and cream cheese. Whole-fat or sweetened yogurt. Full-fat cheeses or blue cheese. Nondairy creamers and whipped toppings. Processed cheese, cheese spreads, or cheese curds. Condiments  Onion and garlic  salt, seasoned salt, table salt, and sea salt. Canned and packaged gravies. Worcestershire sauce. Tartar sauce. Barbecue sauce. Teriyaki sauce. Soy sauce, including reduced sodium. Steak sauce. Fish sauce. Oyster sauce. Cocktail sauce. Horseradish. Ketchup and mustard. Meat flavorings and tenderizers. Bouillon cubes. Hot sauce. Tabasco sauce. Marinades. Taco seasonings. Relishes. Fats and Oils  Butter, stick margarine, lard, shortening, ghee, and bacon fat. Coconut, palm kernel, or palm oils. Regular salad dressings. Other  Pickles and olives. Salted popcorn and pretzels. The items listed above may not be a complete list of foods and beverages to avoid. Contact your dietitian for more information.  Where can I find more information? National Heart, Lung, and Blood Institute: travelstabloid.com This information is not intended to replace advice given to you by your health care provider. Make sure you discuss any questions you have with your health care provider. Document Released: 02/10/2011 Document Revised: 07/30/2015 Document Reviewed: 12/26/2012 Elsevier Interactive Patient Education  2017 [employer].

## 2016-04-08 NOTE — Assessment & Plan Note (Signed)
Improved with her celexa and trace mineral supplement. Continue to monitor. Call with any concerns.

## 2016-04-15 ENCOUNTER — Telehealth: Payer: Self-pay | Admitting: Family Medicine

## 2016-04-15 ENCOUNTER — Encounter: Payer: Self-pay | Admitting: Family Medicine

## 2016-04-15 ENCOUNTER — Ambulatory Visit (INDEPENDENT_AMBULATORY_CARE_PROVIDER_SITE_OTHER): Payer: Medicare Other | Admitting: Family Medicine

## 2016-04-15 VITALS — BP 115/73 | HR 87 | Temp 98.6°F | Wt 158.6 lb

## 2016-04-15 DIAGNOSIS — B029 Zoster without complications: Secondary | ICD-10-CM | POA: Diagnosis not present

## 2016-04-15 DIAGNOSIS — L249 Irritant contact dermatitis, unspecified cause: Secondary | ICD-10-CM | POA: Diagnosis not present

## 2016-04-15 MED ORDER — VALACYCLOVIR HCL 1 G PO TABS: 1000.0000 mg | ORAL_TABLET | Freq: Three times a day (TID) | ORAL | 0 refills | Status: DC

## 2016-04-15 MED ORDER — PREDNISONE 10 MG PO TABS: ORAL_TABLET | ORAL | 0 refills | Status: DC

## 2016-04-15 NOTE — Telephone Encounter (Signed)
Will need to be seen to see if this is shingles or something else

## 2016-04-15 NOTE — Progress Notes (Signed)
BP 115/73 (BP Location: Left Arm, Patient Position: Sitting, Cuff Size: Normal)   Pulse 87   Temp 98.6 F (37 C)   Wt 158 lb 9.6 oz (71.9 kg)   SpO2 100%   BMI 25.60 kg/m    Subjective:    Patient ID: Andrea Savage, female    DOB: 1945/08/25, 71 y.o.   MRN: [id number]  HPI: Andrea Savage is a 70 y.o. female  Chief Complaint  Patient presents with  . Possible Shingles    Same place as last time back states. Back of neck and back.    RASH Duration:  days  Location: neck  Itching: yes Burning: yes Redness: yes Oozing: yes Scaling: yes Blisters: yes Painful: yes Fevers: no Change in detergents/soaps/personal care products: no Recent illness: no Recent travel:no History of same: yes Context: worse Alleviating factors: nothing Treatments attempted:nothing Shortness of breath: no  Throat/tongue swelling: no Myalgias/arthralgias: no  Relevant past medical, surgical, family and social history reviewed and updated as indicated. Interim medical history since our last visit reviewed. Allergies and medications reviewed and updated.  Review of Systems  Constitutional: Negative.   Respiratory: Negative.   Cardiovascular: Negative.   Skin: Positive for rash. Negative for color change, pallor and wound.  Psychiatric/Behavioral: Negative.     Per HPI unless specifically indicated above     Objective:    BP 115/73 (BP Location: Left Arm, Patient Position: Sitting, Cuff Size: Normal)   Pulse 87   Temp 98.6 F (37 C)   Wt 158 lb 9.6 oz (71.9 kg)   SpO2 100%   BMI 25.60 kg/m   Wt Readings from Last 3 Encounters:  04/15/16 158 lb 9.6 oz (71.9 kg)  04/08/16 174 lb (78.9 kg)  03/10/16 158 lb 3.2 oz (71.8 kg)    Physical Exam  Constitutional: She is oriented to person, place, and time. She appears well-developed and well-nourished. No distress.  HENT:  Head: Normocephalic and atraumatic.  Right Ear: Hearing normal.  Left Ear: Hearing normal.  Nose:  Nose normal.  Eyes: Conjunctivae and lids are normal. Right eye exhibits no discharge. Left eye exhibits no discharge. No scleral icterus.  Pulmonary/Chest: Effort normal. No respiratory distress.  Musculoskeletal: Normal range of motion.  Neurological: She is alert and oriented to person, place, and time.  Skin: Skin is warm, dry and intact. Rash noted. No erythema. No pallor.  Vesicular erythematous excoriated rash on upper L shoulder into neck- consistent with shingles.   Excoriated irritated skin on R lower back and into her R scalp- consistent with contact dermatitis.   Psychiatric: She has a normal mood and affect. Her speech is normal and behavior is normal. Judgment and thought content normal. Cognition and memory are normal.    Results for orders placed or performed during the hospital encounter of 12/14/15  I-STAT creatinine  Result Value Ref Range   Creatinine, Ser 0.90 0.44 - 1.00 mg/dL      Assessment & Plan:   Problem List Items Addressed This Visit    None    Visit Diagnoses    Herpes zoster without complication    -  Primary   Will treat with prednisone and valtrex. Will get into ID given this is the 4th time she has had it   Relevant Medications   valACYclovir (VALTREX) 1000 MG tablet   Other Relevant Orders   Ambulatory referral to Infectious Disease   Irritant contact dermatitis, unspecified trigger  On her head. Will treat with prednisone. Call with any concerns or if not getting better.        Follow up plan: Return if symptoms worsen or fail to improve.

## 2016-04-15 NOTE — Telephone Encounter (Signed)
Routing to provider  

## 2016-04-15 NOTE — Telephone Encounter (Signed)
Patient coming in at 1:45.

## 2016-04-20 ENCOUNTER — Telehealth: Payer: Self-pay | Admitting: Family Medicine

## 2016-04-20 NOTE — Telephone Encounter (Signed)
Called and spoke to patient. She stated that she has not received any calls from the infectious disease center. I gave patient their phone number, [phone], and asked for her to give them a call to get her appointment with them scheduled. Patient verbalized understanding.

## 2016-04-20 NOTE — Telephone Encounter (Signed)
Can we try to get patient and see what's going on?

## 2016-04-20 NOTE — Telephone Encounter (Signed)
Routing to provider  

## 2016-05-24 ENCOUNTER — Encounter: Payer: Self-pay | Admitting: Internal Medicine

## 2016-05-24 ENCOUNTER — Ambulatory Visit (INDEPENDENT_AMBULATORY_CARE_PROVIDER_SITE_OTHER): Payer: Medicare Other | Admitting: Internal Medicine

## 2016-05-24 ENCOUNTER — Telehealth: Payer: Self-pay

## 2016-05-24 VITALS — BP 147/87 | HR 88 | Temp 98.5°F | Wt 161.8 lb

## 2016-05-24 DIAGNOSIS — B029 Zoster without complications: Secondary | ICD-10-CM

## 2016-05-24 DIAGNOSIS — R21 Rash and other nonspecific skin eruption: Secondary | ICD-10-CM | POA: Diagnosis not present

## 2016-05-24 DIAGNOSIS — B0223 Postherpetic polyneuropathy: Secondary | ICD-10-CM

## 2016-05-24 MED ORDER — MUPIROCIN 2 % EX OINT: TOPICAL_OINTMENT | CUTANEOUS | 0 refills | Status: DC

## 2016-05-24 MED ORDER — VALACYCLOVIR HCL 1 G PO TABS: 1000.0000 mg | ORAL_TABLET | Freq: Three times a day (TID) | ORAL | 2 refills | Status: DC

## 2016-05-24 NOTE — Progress Notes (Signed)
RFV: community referral for recurrent shingles  Patient ID: Andrea Savage, female   DOB: [birth year], 71 y.o.   MRN: [id number]  HPI Andrea Savage is a 71yo F with depression, GERD, FM, senosirneural hearing loss of the left ear, who reports that she has had several shingles episodes over the span of 2 years, but most recently having recurrences of 2-3 in the last 3-4 months. Her 1st episode was in jan 2016- shingle episode (shoulder)- treated with valtrex. Not associated with pain at this time. She then received Zoster vaccine jan 2017. Later that year, she noticed in August 2017 - blisters in shoulder - treated with valtrex and prednisone and amiltryptine due to neuropathic pain. Again she had episode in Jan 2018 - shingles episode but now mostly in the right flank paraspinal region -treated with  valtrex & prednisone given by her pcp. Now, most recently, on May 18 2016 - starts out with blisters, with surrounding redness, painful because she has been itching. She states that the amiltryptiline helps.  She also states that she has sores on her scalp which have been pruritic   Outpatient Encounter Prescriptions as of 05/24/2016  Medication Sig  . albuterol (PROVENTIL HFA;VENTOLIN HFA) 108 (90 Base) MCG/ACT inhaler Inhale 2 puffs into the lungs every 6 (six) hours as needed for wheezing or shortness of breath.  Marland Kitchen amitriptyline (ELAVIL) 25 MG tablet Take 1 tablet (25 mg total) by mouth at bedtime.  . benzonatate (TESSALON) 100 MG capsule Take 2 capsules (200 mg total) by mouth 3 (three) times daily as needed.  . citalopram (CELEXA) 40 MG tablet Take 1 tablet (20 mg total) by mouth daily.  Marland Kitchen gabapentin (NEURONTIN) 100 MG capsule Take 1 capsule (100 mg total) by mouth 3 (three) times daily.  Marland Kitchen KETOCONAZOLE, TOPICAL, 1 % SHAM Apply 1 application topically 2 (two) times a week.  . Naproxen Sodium (ALEVE) 220 MG CAPS Take by mouth.  . predniSONE (DELTASONE) 10 MG tablet 6 tabs for 2 days, then 5  tabs for 2 day, decrease by 1 every 2 days until gone (Patient not taking: Reported on 05/24/2016)  . UNABLE TO FIND Med Name: terratol  . valACYclovir (VALTREX) 1000 MG tablet Take 1 tablet (1,000 mg total) by mouth 3 (three) times daily. (Patient not taking: Reported on 05/24/2016)   No facility-administered encounter medications on file as of 05/24/2016.      Patient Active Problem List   Diagnosis Date Noted  . Fibromyalgia 03/10/2016  . Sensorineural hearing loss (SNHL) of left ear with restricted hearing of right ear 12/29/2015  . Post herpetic neuralgia 11/12/2015  . Depression 06/29/2015  . Osteopenia   . Reflux   . Rosacea    Social History  Substance Use Topics  . Smoking status: Never Smoker  . Smokeless tobacco: Never Used  . Alcohol use Yes     Comment: on occasion   family history includes Breast cancer in her mother; Cancer in her father and mother; Diabetes in her sister; Kidney disease in her brother; Liver disease in her sister; Thyroid disease in her brother and sister.- grandmother died of mTB   Health Maintenance Due  Topic Date Due  . PNA vac Low Risk Adult (1 of 2 - PCV13) 02/17/2011     Review of Systems + painful rash. Loss of earing of left year. 10 point ros is negative Physical Exam   BP (!) 147/87   Pulse 88   Temp 98.5 F (36.9 C) (  Oral)   Wt 161 lb 12.8 oz (73.4 kg)   SpO2 97%   BMI 26.12 kg/m    Physical Exam  Constitutional:  oriented to person, place, and time. appears well-developed and well-nourished. No distress.  HENT: [place]/AT, PERRLA, no scleral icterus Mouth/Throat: Oropharynx is clear and moist. No oropharyngeal exudate.  Cardiovascular: Normal rate, regular rhythm and normal heart sounds. Exam reveals no gallop and no friction rub.  No murmur heard.  Pulmonary/Chest: Effort normal and breath sounds normal. No respiratory distress.  has no wheezes.  Neck = supple, no nuchal rigidity Abdominal: Soft. Bowel sounds are normal.   exhibits no distension. There is no tenderness.  Lymphadenopathy: no cervical adenopathy. No axillary adenopathy Neurological: alert and oriented to person, place, and time.  Skin: Skin is warm and dry mostly,  Psychiatric: a normal mood and affect.  behavior is normal.   CBC Lab Results  Component Value Date   WBC 4.1 06/29/2015   RBC 4.15 06/29/2015   HGB 13.7 09/28/2011   HCT 39.6 06/29/2015   PLT 217 06/29/2015   MCV 95 06/29/2015   MCH 32.3 06/29/2015   MCHC 33.8 06/29/2015   RDW 13.0 06/29/2015   LYMPHSABS 1.5 06/29/2015   EOSABS 0.1 06/29/2015    BMET Lab Results  Component Value Date   NA 138 06/29/2015   K 4.3 06/29/2015   CL 98 06/29/2015   CO2 25 06/29/2015   GLUCOSE 83 06/29/2015   BUN 17 06/29/2015   CREATININE 0.90 12/14/2015   CALCIUM 9.2 06/29/2015   GFRNONAA 82 06/29/2015   GFRAA 94 06/29/2015      Assessment and Plan  Possible recurrent zoster = previously treated with combination of valacyclovir plus steroids which not sure if she needs prednisone with each cycle. It is interesting that the most recent episodes are on her back which maybe due to auto-inoculation. Her current area of involvement appears slowly healing, no blisters. I will give rx for valtrex to use at next outbreak. If she still have 2 more recurrences this year, may consider chronic suppression.  - recommend getting new  inactivated shingles vaccine since it has better efficacy.  Scalp lesion = unsure if related to excoriation, mrsa skin infection. Will try to do mupirocin BID  Trial. As well as Refer to dermatology  Spent 45 min discussing rashes and recurrent zoster

## 2016-05-24 NOTE — Telephone Encounter (Signed)
Called patient to notify her of dermatology referral to [facility] for Apr 30th, 2018 @ 0930 with Dr. Rollen Sox. Also, clarified why she was being referred to dermatology, which is due to Dr. Baxter Flattery having concerns that rash could be something else besides shingles. Patient stated understanding of all information. Rodman Key, LPN

## 2016-06-03 ENCOUNTER — Encounter: Payer: Self-pay | Admitting: Family Medicine

## 2016-06-03 ENCOUNTER — Ambulatory Visit (INDEPENDENT_AMBULATORY_CARE_PROVIDER_SITE_OTHER): Payer: Medicare Other | Admitting: Family Medicine

## 2016-06-03 VITALS — BP 128/84 | HR 83 | Temp 98.3°F | Resp 17 | Ht 66.0 in | Wt 160.0 lb

## 2016-06-03 DIAGNOSIS — E785 Hyperlipidemia, unspecified: Secondary | ICD-10-CM

## 2016-06-03 DIAGNOSIS — L299 Pruritus, unspecified: Secondary | ICD-10-CM | POA: Diagnosis not present

## 2016-06-03 DIAGNOSIS — R232 Flushing: Secondary | ICD-10-CM | POA: Diagnosis not present

## 2016-06-03 DIAGNOSIS — L659 Nonscarring hair loss, unspecified: Secondary | ICD-10-CM

## 2016-06-03 LAB — BAYER DCA HB A1C WAIVED: HB A1C: 5.2 % (ref <7.0)

## 2016-06-03 MED ORDER — AMITRIPTYLINE HCL 25 MG PO TABS: 25.0000 mg | ORAL_TABLET | Freq: Every day | ORAL | 3 refills | Status: DC

## 2016-06-03 MED ORDER — DOXYCYCLINE HYCLATE 100 MG PO TABS: 100.0000 mg | ORAL_TABLET | Freq: Two times a day (BID) | ORAL | 0 refills | Status: DC

## 2016-06-03 MED ORDER — GABAPENTIN 100 MG PO CAPS: 100.0000 mg | ORAL_CAPSULE | Freq: Three times a day (TID) | ORAL | 3 refills | Status: DC

## 2016-06-03 NOTE — Progress Notes (Signed)
BP 128/84 (BP Location: Left Arm, Patient Position: Sitting, Cuff Size: Normal)   Pulse 83   Temp 98.3 F (36.8 C) (Oral)   Resp 17   Ht 5\' 6"  (1.676 m)   Wt 160 lb (72.6 kg)   SpO2 99%   BMI 25.82 kg/m    Subjective:    Patient ID: Andrea Savage, female    DOB: 1946-02-20, 71 y.o.   MRN: [id number]  HPI: Andrea Savage is a 71 y.o. female  Chief Complaint  Patient presents with  . Herpes Zoster    follow up   Saw ID and they said not to go on prednisone. They advised taking the valacyclovir and discussed the possibility of chronic suppression. They also referred her to dermatology as she has been having a lot of rashes and sores on her legs and arms and in her head. She states that they itch terribly and she scratches them until they bleed. She has been on doxycycline for this in the past, but is anxious about that given that it's an antibiotic. She would like to have her vitamin levels checked again. She is also concerned about "trace elements". She notes that her hair has been falling out and she is also concerned about that.   MENOPAUSAL SYMPTOMS Duration: 5-10 years  Symptom severity: moderate Hot flashes: yes Night sweats: no Sleep disturbances: yes Vaginal dryness: yes Dyspareunia:yes Decreased libido: yes Emotional lability: no Stress incontinence: no Previous HRT/pharmacotherapy: no Hysterectomy: yes Absolute Contraindications to Hormonal Therapy:     Undiagnosed vaginal bleeding: no    Breast cancer: yes    Endometrial cancer: no    Coronary disease: no    Cerebrovascular disease: no    Venous thromboembolic disease: no   Relevant past medical, surgical, family and social history reviewed and updated as indicated. Interim medical history since our last visit reviewed. Allergies and medications reviewed and updated.  Review of Systems  Constitutional: Positive for diaphoresis and fatigue. Negative for activity change, appetite change, chills,  fever and unexpected weight change.  Respiratory: Negative.   Cardiovascular: Negative.   Endocrine: Positive for heat intolerance. Negative for cold intolerance, polydipsia, polyphagia and polyuria.  Genitourinary: Positive for pelvic pain and vaginal pain. Negative for decreased urine volume, difficulty urinating, dyspareunia, dysuria, enuresis, flank pain, frequency, genital sores, hematuria, menstrual problem, urgency, vaginal bleeding and vaginal discharge.  Musculoskeletal: Negative.   Skin: Positive for rash. Negative for color change, pallor and wound.  Psychiatric/Behavioral: Negative.     Per HPI unless specifically indicated above     Objective:    BP 128/84 (BP Location: Left Arm, Patient Position: Sitting, Cuff Size: Normal)   Pulse 83   Temp 98.3 F (36.8 C) (Oral)   Resp 17   Ht 5\' 6"  (1.676 m)   Wt 160 lb (72.6 kg)   SpO2 99%   BMI 25.82 kg/m   Wt Readings from Last 3 Encounters:  06/03/16 160 lb (72.6 kg)  05/24/16 161 lb 12.8 oz (73.4 kg)  04/15/16 158 lb 9.6 oz (71.9 kg)    Physical Exam  Constitutional: She is oriented to person, place, and time. She appears well-developed and well-nourished. No distress.  HENT:  Head: Normocephalic and atraumatic.  Right Ear: Hearing normal.  Left Ear: Hearing normal.  Nose: Nose normal.  Eyes: Conjunctivae and lids are normal. Right eye exhibits no discharge. Left eye exhibits no discharge. No scleral icterus.  Cardiovascular: Normal rate, regular rhythm, normal heart sounds and intact  distal pulses.  Exam reveals no gallop and no friction rub.   No murmur heard. Pulmonary/Chest: Effort normal and breath sounds normal. No respiratory distress. She has no wheezes. She has no rales. She exhibits no tenderness.  Musculoskeletal: Normal range of motion.  Neurological: She is alert and oriented to person, place, and time.  Skin: Skin is warm, dry and intact. Rash noted. She is not diaphoretic. There is erythema. No pallor.    Excoriated rash on arms and legs  Psychiatric: She has a normal mood and affect. Her speech is normal and behavior is normal. Judgment and thought content normal. Cognition and memory are normal.  Nursing note and vitals reviewed.   Results for orders placed or performed in visit on 06/03/16  CBC with Differential/Platelet  Result Value Ref Range   WBC 3.8 3.4 - 10.8 x10E3/uL   RBC 4.29 3.77 - 5.28 x10E6/uL   Hemoglobin 13.7 11.1 - 15.9 g/dL   Hematocrit 39.6 34.0 - 46.6 %   MCV 92 79 - 97 fL   MCH 31.9 26.6 - 33.0 pg   MCHC 34.6 31.5 - 35.7 g/dL   RDW 14.3 12.3 - 15.4 %   Platelets 202 150 - 379 x10E3/uL   Neutrophils 46 Not Estab. %   Lymphs 40 Not Estab. %   Monocytes 11 Not Estab. %   Eos 2 Not Estab. %   Basos 1 Not Estab. %   Neutrophils Absolute 1.8 1.4 - 7.0 x10E3/uL   Lymphocytes Absolute 1.5 0.7 - 3.1 x10E3/uL   Monocytes Absolute 0.4 0.1 - 0.9 x10E3/uL   EOS (ABSOLUTE) 0.1 0.0 - 0.4 x10E3/uL   Basophils Absolute 0.0 0.0 - 0.2 x10E3/uL   Immature Granulocytes 0 Not Estab. %   Immature Grans (Abs) 0.0 0.0 - 0.1 x10E3/uL  Comprehensive metabolic panel  Result Value Ref Range   Glucose 82 65 - 99 mg/dL   BUN 25 8 - 27 mg/dL   Creatinine, Ser 0.92 0.57 - 1.00 mg/dL   GFR calc non Af Amer 63 >59 mL/min/1.73   GFR calc Af Amer 73 >59 mL/min/1.73   BUN/Creatinine Ratio 27 12 - 28   Sodium 137 134 - 144 mmol/L   Potassium 4.1 3.5 - 5.2 mmol/L   Chloride 98 96 - 106 mmol/L   CO2 26 18 - 29 mmol/L   Calcium 9.4 8.7 - 10.3 mg/dL   Total Protein 6.5 6.0 - 8.5 g/dL   Albumin 4.2 3.5 - 4.8 g/dL   Globulin, Total 2.3 1.5 - 4.5 g/dL   Albumin/Globulin Ratio 1.8 1.2 - 2.2   Bilirubin Total 0.4 0.0 - 1.2 mg/dL   Alkaline Phosphatase 80 39 - 117 IU/L   AST 25 0 - 40 IU/L   ALT 20 0 - 32 IU/L  Lipid Panel w/o Chol/HDL Ratio  Result Value Ref Range   Cholesterol, Total 212 (H) 100 - 199 mg/dL   Triglycerides 128 0 - 149 mg/dL   HDL 57 >39 mg/dL   VLDL Cholesterol Cal 26  5 - 40 mg/dL   LDL Calculated 129 (H) 0 - 99 mg/dL  Thyroid Panel With TSH  Result Value Ref Range   TSH 2.740 0.450 - 4.500 uIU/mL   T4, Total 6.7 4.5 - 12.0 ug/dL   T3 Uptake Ratio 27 24 - 39 %   Free Thyroxine Index 1.8 1.2 - 4.9  VITAMIN D 25 Hydroxy (Vit-D Deficiency, Fractures)  Result Value Ref Range   Vit D, 25-Hydroxy 21.2 (L) 30.0 -  100.0 ng/mL  B12 and Folate Panel  Result Value Ref Range   Vitamin B-12 534 232 - 1,245 pg/mL   Folate 19.4 >3.0 ng/mL  Bayer DCA Hb A1c Waived  Result Value Ref Range   Bayer DCA Hb A1c Waived 5.2 <7.0 %      Assessment & Plan:   Problem List Items Addressed This Visit    None    Visit Diagnoses    Itching    -  Primary   Will check labs. She is seeing dermatology shortly. Rx for doxycycline given. Gentle skin cleansing. Call with any concerns.    Relevant Orders   CBC with Differential/Platelet (Completed)   Comprehensive metabolic panel (Completed)   Thyroid Panel With TSH (Completed)   VITAMIN D 25 Hydroxy (Vit-D Deficiency, Fractures) (Completed)   B12 and Folate Panel (Completed)   Bayer DCA Hb A1c Waived (Completed)   Hair loss       Will check labs. She is seeing dermatology shortly. Rx for doxycycline given. Gentle skin cleansing. Call with any concerns.    Relevant Orders   CBC with Differential/Platelet (Completed)   Comprehensive metabolic panel (Completed)   Thyroid Panel With TSH (Completed)   VITAMIN D 25 Hydroxy (Vit-D Deficiency, Fractures) (Completed)   B12 and Folate Panel (Completed)   Bayer DCA Hb A1c Waived (Completed)   Hyperlipidemia, unspecified hyperlipidemia type       Borderline last check. Will check today. Await results. Treat as needed.    Relevant Orders   Lipid Panel w/o Chol/HDL Ratio (Completed)   Hot flashes       Discussed risks and will hold on hormones. Will increase gabapentin. Recheck 1 month. Call with any concerns.        Follow up plan: Return in about 4 weeks (around 07/01/2016)  for Follow up hot flashes.

## 2016-06-04 LAB — CBC WITH DIFFERENTIAL/PLATELET
BASOS ABS: 0 10*3/uL (ref 0.0–0.2)
Basos: 1 %
EOS (ABSOLUTE): 0.1 10*3/uL (ref 0.0–0.4)
Eos: 2 %
HEMATOCRIT: 39.6 % (ref 34.0–46.6)
HEMOGLOBIN: 13.7 g/dL (ref 11.1–15.9)
Immature Grans (Abs): 0 10*3/uL (ref 0.0–0.1)
Immature Granulocytes: 0 %
LYMPHS ABS: 1.5 10*3/uL (ref 0.7–3.1)
Lymphs: 40 %
MCH: 31.9 pg (ref 26.6–33.0)
MCHC: 34.6 g/dL (ref 31.5–35.7)
MCV: 92 fL (ref 79–97)
MONOCYTES: 11 %
MONOS ABS: 0.4 10*3/uL (ref 0.1–0.9)
NEUTROS ABS: 1.8 10*3/uL (ref 1.4–7.0)
Neutrophils: 46 %
Platelets: 202 10*3/uL (ref 150–379)
RBC: 4.29 x10E6/uL (ref 3.77–5.28)
RDW: 14.3 % (ref 12.3–15.4)
WBC: 3.8 10*3/uL (ref 3.4–10.8)

## 2016-06-04 LAB — COMPREHENSIVE METABOLIC PANEL
ALBUMIN: 4.2 g/dL (ref 3.5–4.8)
ALK PHOS: 80 IU/L (ref 39–117)
ALT: 20 IU/L (ref 0–32)
AST: 25 IU/L (ref 0–40)
Albumin/Globulin Ratio: 1.8 (ref 1.2–2.2)
BILIRUBIN TOTAL: 0.4 mg/dL (ref 0.0–1.2)
BUN / CREAT RATIO: 27 (ref 12–28)
BUN: 25 mg/dL (ref 8–27)
CHLORIDE: 98 mmol/L (ref 96–106)
CO2: 26 mmol/L (ref 18–29)
Calcium: 9.4 mg/dL (ref 8.7–10.3)
Creatinine, Ser: 0.92 mg/dL (ref 0.57–1.00)
GFR calc Af Amer: 73 mL/min/{1.73_m2} (ref >59)
GFR calc non Af Amer: 63 mL/min/{1.73_m2} (ref >59)
GLOBULIN, TOTAL: 2.3 g/dL (ref 1.5–4.5)
GLUCOSE: 82 mg/dL (ref 65–99)
POTASSIUM: 4.1 mmol/L (ref 3.5–5.2)
SODIUM: 137 mmol/L (ref 134–144)
Total Protein: 6.5 g/dL (ref 6.0–8.5)

## 2016-06-04 LAB — LIPID PANEL W/O CHOL/HDL RATIO
CHOLESTEROL TOTAL: 212 mg/dL — AB (ref 100–199)
HDL: 57 mg/dL (ref >39)
LDL Calculated: 129 mg/dL — ABNORMAL HIGH (ref 0–99)
Triglycerides: 128 mg/dL (ref 0–149)
VLDL Cholesterol Cal: 26 mg/dL (ref 5–40)

## 2016-06-04 LAB — THYROID PANEL WITH TSH
Free Thyroxine Index: 1.8 (ref 1.2–4.9)
T3 UPTAKE RATIO: 27 % (ref 24–39)
T4, Total: 6.7 ug/dL (ref 4.5–12.0)
TSH: 2.74 u[IU]/mL (ref 0.450–4.500)

## 2016-06-04 LAB — B12 AND FOLATE PANEL
FOLATE: 19.4 ng/mL (ref >3.0)
VITAMIN B 12: 534 pg/mL (ref 232–1245)

## 2016-06-04 LAB — VITAMIN D 25 HYDROXY (VIT D DEFICIENCY, FRACTURES): VIT D 25 HYDROXY: 21.2 ng/mL — AB (ref 30.0–100.0)

## 2016-06-29 ENCOUNTER — Ambulatory Visit: Payer: Medicare Other | Admitting: Family Medicine

## 2016-07-11 ENCOUNTER — Ambulatory Visit (INDEPENDENT_AMBULATORY_CARE_PROVIDER_SITE_OTHER): Payer: Medicare Other

## 2016-07-11 VITALS — BP 114/75 | HR 81

## 2016-07-11 DIAGNOSIS — R030 Elevated blood-pressure reading, without diagnosis of hypertension: Secondary | ICD-10-CM

## 2016-07-11 NOTE — Progress Notes (Signed)
Patient came in for BP check. States she had been checking it on her automatic machine at home and it was high. Wanted to double check her BP on our machine. BP was good here at our office.

## 2016-08-08 ENCOUNTER — Encounter: Payer: Self-pay | Admitting: Family Medicine

## 2016-08-08 ENCOUNTER — Ambulatory Visit (INDEPENDENT_AMBULATORY_CARE_PROVIDER_SITE_OTHER): Payer: Medicare Other | Admitting: Family Medicine

## 2016-08-08 VITALS — BP 127/81 | HR 87 | Temp 98.1°F | Wt 161.0 lb

## 2016-08-08 DIAGNOSIS — R059 Cough, unspecified: Secondary | ICD-10-CM

## 2016-08-08 DIAGNOSIS — R05 Cough: Secondary | ICD-10-CM | POA: Diagnosis not present

## 2016-08-08 MED ORDER — HYDROCOD POLST-CPM POLST ER 10-8 MG/5ML PO SUER: 5.0000 mL | Freq: Two times a day (BID) | ORAL | 0 refills | Status: DC | PRN

## 2016-08-08 MED ORDER — ALBUTEROL SULFATE (2.5 MG/3ML) 0.083% IN NEBU: 2.5000 mg | INHALATION_SOLUTION | Freq: Once | RESPIRATORY_TRACT | Status: DC

## 2016-08-08 MED ORDER — DOXYCYCLINE HYCLATE 100 MG PO TABS: 100.0000 mg | ORAL_TABLET | Freq: Two times a day (BID) | ORAL | 0 refills | Status: DC

## 2016-08-08 MED ORDER — TIZANIDINE HCL 2 MG PO CAPS
2.0000 mg | ORAL_CAPSULE | Freq: Three times a day (TID) | ORAL | 0 refills | Status: DC | PRN
Start: 2016-08-08 — End: 2017-03-23

## 2016-08-08 NOTE — Progress Notes (Signed)
   BP 127/81   Pulse 87   Temp 98.1 F (36.7 C) (Oral)   Wt 161 lb (73 kg)   SpO2 99%   BMI 25.99 kg/m    Subjective:    Patient ID: Andrea Savage, female    DOB: Jun 15, 1945, 71 y.o.   MRN: [id number]  HPI: Andrea Savage is a 71 y.o. female  Chief Complaint  Patient presents with  . Cough    Been Dry, progressing to progressive.  . Muscle Pain    Due to cough.    Over a week of severe cough that started as a dry cough and is now productive. Having chest tightness and muscle spasms from all the coughing at this point. Minimal congestion or other upper respiratory sxs at this time, those have mostly resolved. Taking mucinex cold and cough with minimal relief. Trying tessalon and tussionex also with no relief. Taking albuterol inhaler prn but states it isn't helping much.  Husband was sick several days prior to her sxs starting.   Seeing Pulmonology Wednesday as she has been struggling with this severe cough for months and has been on prednisone x 5 in this period. Normal CXR back in December. Has undergone extensive GI workup in case GI etiology to her chronic cough.   Relevant past medical, surgical, family and social history reviewed and updated as indicated. Interim medical history since our last visit reviewed. Allergies and medications reviewed and updated.  Review of Systems  Constitutional: Negative.   HENT: Negative.   Eyes: Negative.   Respiratory: Positive for cough and chest tightness.   Cardiovascular: Negative.   Gastrointestinal: Negative.   Genitourinary: Negative.   Musculoskeletal: Positive for myalgias.  Neurological: Negative.   Psychiatric/Behavioral: Negative.     Per HPI unless specifically indicated above     Objective:    BP 127/81   Pulse 87   Temp 98.1 F (36.7 C) (Oral)   Wt 161 lb (73 kg)   SpO2 99%   BMI 25.99 kg/m   Wt Readings from Last 3 Encounters:  08/08/16 161 lb (73 kg)  06/03/16 160 lb (72.6 kg)  05/24/16 161 lb  12.8 oz (73.4 kg)    Physical Exam  Constitutional: She is oriented to person, place, and time. She appears well-developed and well-nourished. No distress.  HENT:  Head: Atraumatic.  Right Ear: External ear normal.  Left Ear: External ear normal.  Nose: Nose normal.  Oropharynx erythematous  Eyes: Conjunctivae are normal. Pupils are equal, round, and reactive to light.  Neck: Normal range of motion. Neck supple.  Cardiovascular: Normal rate and normal heart sounds.   Pulmonary/Chest: Effort normal. No respiratory distress. She has wheezes.  Musculoskeletal: Normal range of motion.  Neurological: She is alert and oriented to person, place, and time.  Skin: Skin is warm and dry.  Psychiatric: She has a normal mood and affect. Her behavior is normal.  Nursing note and vitals reviewed.     Assessment & Plan:   Problem List Items Addressed This Visit    None    Visit Diagnoses    Cough    -  Primary   Normal CXR in December. Nebulizer given in office with mild relief. Will start doxycycline and refill tussionex. F/u as scheduled with pulmonology.    Relevant Medications   albuterol (PROVENTIL) (2.5 MG/3ML) 0.083% nebulizer solution 2.5 mg       Follow up plan: Return for Pulmonology as scheduled.

## 2016-09-13 ENCOUNTER — Telehealth: Payer: Self-pay | Admitting: Family Medicine

## 2016-09-13 MED ORDER — BUDESONIDE-FORMOTEROL FUMARATE 160-4.5 MCG/ACT IN AERO: 2.0000 | INHALATION_SPRAY | Freq: Two times a day (BID) | RESPIRATORY_TRACT | 12 refills | Status: DC

## 2016-09-13 NOTE — Telephone Encounter (Signed)
Pt stated that the Pulmonology Dept at [facility] (referral from Dr. [name]) would like her to continue with SYMBICORT (budesonide - formoterol fumarate dihydrate)  Rx. She would like for Dr. [name] to provide a Prescription, please contact pt to follow up. Thanks, knb

## 2016-09-13 NOTE — Telephone Encounter (Signed)
No problem. What dose is she on?

## 2016-09-13 NOTE — Telephone Encounter (Signed)
Symbicort 160/4.5

## 2016-09-14 ENCOUNTER — Other Ambulatory Visit: Payer: Self-pay | Admitting: Podiatry

## 2016-09-14 DIAGNOSIS — M79661 Pain in right lower leg: Secondary | ICD-10-CM

## 2016-09-14 DIAGNOSIS — M7989 Other specified soft tissue disorders: Secondary | ICD-10-CM

## 2016-09-15 ENCOUNTER — Ambulatory Visit
Admission: RE | Admit: 2016-09-15 | Discharge: 2016-09-15 | Disposition: A | Discharge status: Home / Self Care | Payer: Medicare Other | Source: Ambulatory Visit | Attending: Podiatry | Admitting: Podiatry

## 2016-09-15 ENCOUNTER — Ambulatory Visit: Payer: Medicare Other

## 2016-09-15 DIAGNOSIS — M79661 Pain in right lower leg: Secondary | ICD-10-CM

## 2016-09-15 DIAGNOSIS — M7989 Other specified soft tissue disorders: Secondary | ICD-10-CM | POA: Insufficient documentation

## 2016-09-15 DIAGNOSIS — M79604 Pain in right leg: Secondary | ICD-10-CM | POA: Diagnosis present

## 2016-10-06 ENCOUNTER — Other Ambulatory Visit: Payer: Self-pay | Admitting: Family Medicine

## 2016-11-09 ENCOUNTER — Ambulatory Visit: Payer: Self-pay

## 2016-11-30 ENCOUNTER — Ambulatory Visit (INDEPENDENT_AMBULATORY_CARE_PROVIDER_SITE_OTHER): Payer: Medicare Other

## 2016-11-30 VITALS — BP 118/70 | HR 80 | Temp 98.8°F | Resp 16 | Ht 65.5 in | Wt 157.5 lb

## 2016-11-30 DIAGNOSIS — Z0000 Encounter for general adult medical examination without abnormal findings: Secondary | ICD-10-CM

## 2016-11-30 DIAGNOSIS — Z23 Encounter for immunization: Secondary | ICD-10-CM | POA: Diagnosis not present

## 2016-11-30 NOTE — Progress Notes (Signed)
Subjective:   Andrea Savage is a 71 y.o. female who presents for Medicare Annual (Subsequent) preventive examination.  Review of Systems:  N/A Cardiac Risk Factors include: advanced age (>44men, >58 women)     Objective:     Vitals: BP 118/70 (BP Location: Left Arm, Patient Position: Sitting)   Pulse 80   Temp 98.8 F (37.1 C) (Oral)   Resp 16   Ht 5' 5.5" (1.664 m)   Wt 157 lb 8 oz (71.4 kg)   BMI 25.81 kg/m   Body mass index is 25.81 kg/m.   Tobacco History  Smoking Status  . Never Smoker  Smokeless Tobacco  . Never Used     Counseling given: Not Answered   Past Medical History:  Diagnosis Date  . Asthma   . Depression   . Fibromyalgia   . GERD (gastroesophageal reflux disease)   . IBS (irritable bowel syndrome)   . Low bone density   . Osteopenia   . Reflux   . Rosacea    Past Surgical History:  Procedure Laterality Date  . ABDOMINAL HYSTERECTOMY  2009   Total  . BREAST EXCISIONAL BIOPSY Left 1979   NEG  . COLONOSCOPY  [id number]   repeat 10 years  . FOOT SURGERY Right 1990  . HAND SURGERY Right   . LAPAROSCOPIC OOPHERECTOMY    . TONSILLECTOMY     Family History  Problem Relation Age of Onset  . Cancer Mother        breast  . Breast cancer Mother   . Cancer Father        pancreatic  . Liver disease Sister   . Diabetes Sister   . Thyroid disease Sister   . Thyroid disease Brother   . Kidney disease Brother    History  Sexual Activity  . Sexual activity: No    Outpatient Encounter Prescriptions as of 11/30/2016  Medication Sig  . albuterol (PROVENTIL HFA;VENTOLIN HFA) 108 (90 Base) MCG/ACT inhaler Inhale 2 puffs into the lungs every 6 (six) hours as needed for wheezing or shortness of breath.  Marland Kitchen amitriptyline (ELAVIL) 25 MG tablet Take 1 tablet (25 mg total) by mouth at bedtime.  . citalopram (CELEXA) 40 MG tablet Take 1 tablet (20 mg total) by mouth daily.  . fluticasone (FLONASE) 50 MCG/ACT nasal spray Place 2 sprays into both  nostrils daily.   Marland Kitchen gabapentin (NEURONTIN) 100 MG capsule Take 1-3 capsules (100-300 mg total) by mouth 3 (three) times daily.  Marland Kitchen KETOCONAZOLE, TOPICAL, 1 % SHAM Apply 1 application topically 2 (two) times a week.  . montelukast (SINGULAIR) 10 MG tablet Take 10 mg by mouth at bedtime.  . Naproxen Sodium (ALEVE) 220 MG CAPS Take by mouth every evening.   Marland Kitchen omeprazole (PRILOSEC) 20 MG capsule Take 20 mg by mouth daily.  . phenylephrine (SUDAFED PE) 10 MG TABS tablet Take 20 mg by mouth every 4 (four) hours as needed.  . tizanidine (ZANAFLEX) 2 MG capsule Take 1 capsule (2 mg total) by mouth 3 (three) times daily as needed for muscle spasms.  Marland Kitchen UNABLE TO FIND Med Name: terratol  . valACYclovir (VALTREX) 1000 MG tablet Take 1 tablet (1,000 mg total) by mouth 3 (three) times daily.  . [DISCONTINUED] BIOTIN PO Take by mouth daily. Takes 2 daily  . [DISCONTINUED] budesonide-formoterol (SYMBICORT) 160-4.5 MCG/ACT inhaler Inhale 2 puffs into the lungs 2 (two) times daily.  . [DISCONTINUED] chlorpheniramine-HYDROcodone (TUSSIONEX PENNKINETIC ER) 10-8 MG/5ML SUER Take 5 mLs by  mouth every 12 (twelve) hours as needed for cough.  . [DISCONTINUED] doxycycline (VIBRA-TABS) 100 MG tablet Take 1 tablet (100 mg total) by mouth 2 (two) times daily.  . [DISCONTINUED] mupirocin ointment (BACTROBAN) 2 % Apply to affected area 2 times daily   Facility-Administered Encounter Medications as of 11/30/2016  Medication  . albuterol (PROVENTIL) (2.5 MG/3ML) 0.083% nebulizer solution 2.5 mg    Activities of Daily Living In your present state of health, do you have any difficulty performing the following activities: 11/30/2016  Hearing? Y  Comment recently developed hearing loss and has hearing aids to assist  Vision? N  Difficulty concentrating or making decisions? N  Walking or climbing stairs? N  Dressing or bathing? N  Doing errands, shopping? N  Preparing Food and eating ? N  Using the Toilet? N  In the past six  months, have you accidently leaked urine? N  Do you have problems with loss of bowel control? N  Managing your Medications? N  Managing your Finances? N  Housekeeping or managing your Housekeeping? N  Some recent data might be hidden    Patient Care Team: Valerie Roys, DO as PCP - General (Family Medicine) Pa, Evans (Optometry) Beverly Gust, MD (Unknown Physician Specialty) Delora Fuel, MD as Referring Physician (Audiology) Brendolyn Patty, MD (Dermatology) Heber Rockwood, MD as Referring Physician (Neurosurgery) Robbie Louis, MD as Consulting Physician (Student)    Assessment:     Exercise Activities and Dietary recommendations Current Exercise Habits: The patient does not participate in regular exercise at present (recently developed asthma and is being treated.), Exercise limited by: respiratory conditions(s) (asthma)  Goals    None     Fall Risk Fall Risk  11/30/2016 08/08/2016 05/24/2016 04/08/2016 11/12/2015  Falls in the past year? Yes No Yes No Yes  Number falls in past yr: 2 or more - 1 - 1  Injury with Fall? No - Yes - Yes  Comment - - hurt shoulders and knees - -  Risk Factor Category  High Fall Risk - - - -  Comment states she sustained recent hearing loss and has recently gotten hearing aids - - - -  Risk for fall due to : Impaired balance/gait;Impaired vision - - - -  Risk for fall due to: Comment recently developed hearing loss and now has hearing aids. Also wears glasses - - - -  Follow up Education provided;Falls prevention discussed - - - -   Depression Screen PHQ 2/9 Scores 11/30/2016 11/12/2015 06/29/2015 06/29/2015  PHQ - 2 Score 0 0 - -  Exception Documentation - - Patient refusal Patient refusal     Cognitive Function     6CIT Screen 11/30/2016  What Year? 0 points  What month? 0 points  What time? 0 points  Count back from 20 0 points  Months in reverse 2 points  Repeat phrase 0 points  Total Score 2    Immunization History    Administered Date(s) Administered  . Hepatitis B 03/17/2008, 05/05/2008, 08/05/2008  . Influenza, High Dose Seasonal PF 11/30/2016  . Influenza-Unspecified 11/06/2015  . Pneumococcal Conjugate-13 11/30/2016  . Td 03/07/1997, 12/01/2008  . Zoster 03/20/2015   Screening Tests Health Maintenance  Topic Date Due  . PNA vac Low Risk Adult (2 of 2 - PPSV23) 11/30/2017  . MAMMOGRAM  01/17/2018  . TETANUS/TDAP  12/02/2018  . COLONOSCOPY  01/18/2021  . INFLUENZA VACCINE  Completed  . DEXA SCAN  Completed  . Hepatitis C  Screening  Completed      Plan:     I have personally reviewed and addressed the Medicare Annual Wellness questionnaire and have noted the following in the patient's chart:  A. Medical and social history B. Use of alcohol, tobacco or illicit drugs  C. Current medications and supplements D. Functional ability and status E.  Nutritional status F.  Physical activity G. Advance directives H. List of other physicians I.  Hospitalizations, surgeries, and ER visits in previous 12 months J.  [place] such as hearing and vision if needed, cognitive and depression L. Referrals and appointments - none  In addition, I have reviewed and discussed with patient certain preventive protocols, quality metrics, and best practice recommendations. A written personalized care plan for preventive services as well as general preventive health recommendations were provided to patient.  See attached scanned questionnaire for additional information.   Signed,  Aleatha Borer, LPN Nurse Health Advisor   MD Recommendations: None

## 2016-11-30 NOTE — Patient Instructions (Addendum)
Ms. Bulger , Thank you for taking time to come for your Medicare Wellness Visit. I appreciate your ongoing commitment to your health goals. Please review the following plan we discussed and let me know if I can assist you in the future.   Screening recommendations/referrals: Colonoscopy: completed 01/25/11 Mammogram: completed 01/19/16 Bone Density: completed 06/05/08 Recommended yearly ophthalmology/optometry visit for glaucoma screening and checkup Recommended yearly dental visit for hygiene and checkup  Vaccinations: Influenza vaccine: given today Pneumococcal vaccine: Prevnar 13 given today Tdap vaccine: up to date Shingles vaccine: up to date    Advanced directives: Documents given to pt to complete and return to office prior to next visit  Conditions/risks identified: Fall prevention discussed. Given recent diagnosis of asthma, recommend resuming exercise routine  Next appointment: Follow up in one year for annual wellness visit.   Preventive Care 71 Years and Older, Female Preventive care refers to lifestyle choices and visits with your health care provider that can promote health and wellness. What does preventive care include?  A yearly physical exam. This is also called an annual well check.  Dental exams once or twice a year.  Routine eye exams. Ask your health care provider how often you should have your eyes checked.  Personal lifestyle choices, including:  Daily care of your teeth and gums.  Regular physical activity.  Eating a healthy diet.  Avoiding tobacco and drug use.  Limiting alcohol use.  Practicing safe sex.  Taking low-dose aspirin every day.  Taking vitamin and mineral supplements as recommended by your health care provider. What happens during an annual well check? The services and screenings done by your health care provider during your annual well check will depend on your age, overall health, lifestyle risk factors, and family history of  disease. Counseling  Your health care provider may ask you questions about your:  Alcohol use.  Tobacco use.  Drug use.  Emotional well-being.  Home and relationship well-being.  Sexual activity.  Eating habits.  History of falls.  Memory and ability to understand (cognition).  Work and work Statistician.  Reproductive health. Screening  You may have the following tests or measurements:  Height, weight, and BMI.  Blood pressure.  Lipid and cholesterol levels. These may be checked every 5 years, or more frequently if you are over 76 years old.  Skin check.  Lung cancer screening. You may have this screening every year starting at age 71 if you have a 30-pack-year history of smoking and currently smoke or have quit within the past 15 years.  Fecal occult blood test (FOBT) of the stool. You may have this test every year starting at age 71.  Flexible sigmoidoscopy or colonoscopy. You may have a sigmoidoscopy every 5 years or a colonoscopy every 10 years starting at age 71.  Hepatitis C blood test.  Hepatitis B blood test.  Sexually transmitted disease (STD) testing.  Diabetes screening. This is done by checking your blood sugar (glucose) after you have not eaten for a while (fasting). You may have this done every 1-3 years.  Bone density scan. This is done to screen for osteoporosis. You may have this done starting at age 71.  Mammogram. This may be done every 1-2 years. Talk to your health care provider about how often you should have regular mammograms. Talk with your health care provider about your test results, treatment options, and if necessary, the need for more tests. Vaccines  Your health care provider may recommend certain vaccines, such as:  Influenza vaccine. This is recommended every year.  Tetanus, diphtheria, and acellular pertussis (Tdap, Td) vaccine. You may need a Td booster every 10 years.  Zoster vaccine. You may need this after age  71.  Pneumococcal 13-valent conjugate (PCV13) vaccine. One dose is recommended after age 71.  Pneumococcal polysaccharide (PPSV23) vaccine. One dose is recommended after age 71. Talk to your health care provider about which screenings and vaccines you need and how often you need them. This information is not intended to replace advice given to you by your health care provider. Make sure you discuss any questions you have with your health care provider. Document Released: 03/20/2015 Document Revised: 11/11/2015 Document Reviewed: 12/23/2014 Elsevier Interactive Patient Education  2017 [place] Prevention in the Home Falls can cause injuries. They can happen to people of all ages. There are many things you can do to make your home safe and to help prevent falls. What can I do on the outside of my home?  Regularly fix the edges of walkways and driveways and fix any cracks.  Remove anything that might make you trip as you walk through a door, such as a raised step or threshold.  Trim any bushes or trees on the path to your home.  Use bright outdoor lighting.  Clear any walking paths of anything that might make someone trip, such as rocks or tools.  Regularly check to see if handrails are loose or broken. Make sure that both sides of any steps have handrails.  Any raised decks and porches should have guardrails on the edges.  Have any leaves, snow, or ice cleared regularly.  Use sand or salt on walking paths during winter.  Clean up any spills in your garage right away. This includes oil or grease spills. What can I do in the bathroom?  Use night lights.  Install grab bars by the toilet and in the tub and shower. Do not use towel bars as grab bars.  Use non-skid mats or decals in the tub or shower.  If you need to sit down in the shower, use a plastic, non-slip stool.  Keep the floor dry. Clean up any water that spills on the floor as soon as it happens.  Remove  soap buildup in the tub or shower regularly.  Attach bath mats securely with double-sided non-slip rug tape.  Do not have throw rugs and other things on the floor that can make you trip. What can I do in the bedroom?  Use night lights.  Make sure that you have a light by your bed that is easy to reach.  Do not use any sheets or blankets that are too big for your bed. They should not hang down onto the floor.  Have a firm chair that has side arms. You can use this for support while you get dressed.  Do not have throw rugs and other things on the floor that can make you trip. What can I do in the kitchen?  Clean up any spills right away.  Avoid walking on wet floors.  Keep items that you use a lot in easy-to-reach places.  If you need to reach something above you, use a strong step stool that has a grab bar.  Keep electrical cords out of the way.  Do not use floor polish or wax that makes floors slippery. If you must use wax, use non-skid floor wax.  Do not have throw rugs and other things on the floor that  can make you trip. What can I do with my stairs?  Do not leave any items on the stairs.  Make sure that there are handrails on both sides of the stairs and use them. Fix handrails that are broken or loose. Make sure that handrails are as long as the stairways.  Check any carpeting to make sure that it is firmly attached to the stairs. Fix any carpet that is loose or worn.  Avoid having throw rugs at the top or bottom of the stairs. If you do have throw rugs, attach them to the floor with carpet tape.  Make sure that you have a light switch at the top of the stairs and the bottom of the stairs. If you do not have them, ask someone to add them for you. What else can I do to help prevent falls?  Wear shoes that:  Do not have high heels.  Have rubber bottoms.  Are comfortable and fit you well.  Are closed at the toe. Do not wear sandals.  If you use a  stepladder:  Make sure that it is fully opened. Do not climb a closed stepladder.  Make sure that both sides of the stepladder are locked into place.  Ask someone to hold it for you, if possible.  Clearly mark and make sure that you can see:  Any grab bars or handrails.  First and last steps.  Where the edge of each step is.  Use tools that help you move around (mobility aids) if they are needed. These include:  Canes.  Walkers.  Scooters.  Crutches.  Turn on the lights when you go into a dark area. Replace any light bulbs as soon as they burn out.  Set up your furniture so you have a clear path. Avoid moving your furniture around.  If any of your floors are uneven, fix them.  If there are any pets around you, be aware of where they are.  Review your medicines with your doctor. Some medicines can make you feel dizzy. This can increase your chance of falling. Ask your doctor what other things that you can do to help prevent falls. This information is not intended to replace advice given to you by your health care provider. Make sure you discuss any questions you have with your health care provider. Document Released: 12/18/2008 Document Revised: 07/30/2015 Document Reviewed: 03/28/2014 Elsevier Interactive Patient Education  2017 [place].  Influenza (Flu) Vaccine (Inactivated or Recombinant): What You Need to Know 1. Why get vaccinated? Influenza ("flu") is a contagious disease that spreads around the [place] every year, usually between October and May. Flu is caused by influenza viruses, and is spread mainly by coughing, sneezing, and close contact. Anyone can get flu. Flu strikes suddenly and can last several days. Symptoms vary by age, but can include:  fever/chills  sore throat  muscle aches  fatigue  cough  headache  runny or stuffy nose  Flu can also lead to pneumonia and blood infections, and cause diarrhea and seizures in children. If you  have a medical condition, such as heart or lung disease, flu can make it worse. Flu is more dangerous for some people. Infants and young children, people 1 years of age and older, pregnant women, and people with certain health conditions or a weakened immune system are at greatest risk. Each year thousands of people in the [place] die from flu, and many more are hospitalized. Flu vaccine can:  keep you from  getting flu,  make flu less severe if you do get it, and  keep you from spreading flu to your family and other people. 2. Inactivated and recombinant flu vaccines A dose of flu vaccine is recommended every flu season. Children 6 months through 30 years of age may need two doses during the same flu season. Everyone else needs only one dose each flu season. Some inactivated flu vaccines contain a very small amount of a mercury-based preservative called thimerosal. Studies have not shown thimerosal in vaccines to be harmful, but flu vaccines that do not contain thimerosal are available. There is no live flu virus in flu shots. They cannot cause the flu. There are many flu viruses, and they are always changing. Each year a new flu vaccine is made to protect against three or four viruses that are likely to cause disease in the upcoming flu season. But even when the vaccine doesn't exactly match these viruses, it may still provide some protection. Flu vaccine cannot prevent:  flu that is caused by a virus not covered by the vaccine, or  illnesses that look like flu but are not.  It takes about 2 weeks for protection to develop after vaccination, and protection lasts through the flu season. 3. Some people should not get this vaccine Tell the person who is giving you the vaccine:  If you have any severe, life-threatening allergies. If you ever had a life-threatening allergic reaction after a dose of flu vaccine, or have a severe allergy to any part of this vaccine, you may be advised not to  get vaccinated. Most, but not all, types of flu vaccine contain a small amount of egg protein.  If you ever had Guillain-Barr Syndrome (also called GBS). Some people with a history of GBS should not get this vaccine. This should be discussed with your doctor.  If you are not feeling well. It is usually okay to get flu vaccine when you have a mild illness, but you might be asked to come back when you feel better.  4. Risks of a vaccine reaction With any medicine, including vaccines, there is a chance of reactions. These are usually mild and go away on their own, but serious reactions are also possible. Most people who get a flu shot do not have any problems with it. Minor problems following a flu shot include:  soreness, redness, or swelling where the shot was given  hoarseness  sore, red or itchy eyes  cough  fever  aches  headache  itching  fatigue  If these problems occur, they usually begin soon after the shot and last 1 or 2 days. More serious problems following a flu shot can include the following:  There may be a small increased risk of Guillain-Barre Syndrome (GBS) after inactivated flu vaccine. This risk has been estimated at 1 or 2 additional cases per million people vaccinated. This is much lower than the risk of severe complications from flu, which can be prevented by flu vaccine.  Young children who get the flu shot along with pneumococcal vaccine (PCV13) and/or DTaP vaccine at the same time might be slightly more likely to have a seizure caused by fever. Ask your doctor for more information. Tell your doctor if a child who is getting flu vaccine has ever had a seizure.  Problems that could happen after any injected vaccine:  People sometimes faint after a medical procedure, including vaccination. Sitting or lying down for about 15 minutes can help prevent fainting, and  injuries caused by a fall. Tell your doctor if you feel dizzy, or have vision changes or ringing  in the ears.  Some people get severe pain in the shoulder and have difficulty moving the arm where a shot was given. This happens very rarely.  Any medication can cause a severe allergic reaction. Such reactions from a vaccine are very rare, estimated at about 1 in a million doses, and would happen within a few minutes to a few hours after the vaccination. As with any medicine, there is a very remote chance of a vaccine causing a serious injury or death. The safety of vaccines is always being monitored. For more information, visit: http://www.aguilar.org/ 5. What if there is a serious reaction? What should I look for? Look for anything that concerns you, such as signs of a severe allergic reaction, very high fever, or unusual behavior. Signs of a severe allergic reaction can include hives, swelling of the face and throat, difficulty breathing, a fast heartbeat, dizziness, and weakness. These would start a few minutes to a few hours after the vaccination. What should I do?  If you think it is a severe allergic reaction or other emergency that can't wait, call 9-1-1 and get the person to the nearest hospital. Otherwise, call your doctor.  Reactions should be reported to the Vaccine Adverse Event Reporting System (VAERS). Your doctor should file this report, or you can do it yourself through the VAERS web site at www.vaers.SamedayNews.es, or by calling (279) 782-9685. ? VAERS does not give medical advice. 6. The National Vaccine Injury Compensation Program The [employer] Vaccine Injury Compensation Program (VICP) is a federal program that was created to compensate people who may have been injured by certain vaccines. Persons who believe they may have been injured by a vaccine can learn about the program and about filing a claim by calling 463-540-2106 or visiting the [place] website at GoldCloset.com.ee. There is a time limit to file a claim for compensation. 7. How can I learn more?  Ask  your healthcare provider. He or she can give you the vaccine package insert or suggest other sources of information.  Call your local or state health department.  Contact the Centers for Disease Control and Prevention (CDC): ? Call 657-409-8805 (1-800-CDC-INFO) or ? Visit CDC's website at https://gibson.com/ Vaccine Information Statement, Inactivated Influenza Vaccine (10/11/2013) This information is not intended to replace advice given to you by your health care provider. Make sure you discuss any questions you have with your health care provider. Document Released: 12/16/2005 Document Revised: 11/12/2015 Document Reviewed: 11/12/2015 Elsevier Interactive Patient Education  2017 [employer]  Pneumococcal Conjugate Vaccine (PCV13) What You Need to Know 1. Why get vaccinated? Vaccination can protect both children and adults from pneumococcal disease. Pneumococcal disease is caused by bacteria that can spread from person to person through close contact. It can cause ear infections, and it can also lead to more serious infections of the:  Lungs (pneumonia),  Blood (bacteremia), and  Covering of the brain and spinal cord (meningitis).  Pneumococcal pneumonia is most common among adults. Pneumococcal meningitis can cause deafness and brain damage, and it kills about 1 child in 10 who get it. Anyone can get pneumococcal disease, but children under 73 years of age and adults 55 years and older, people with certain medical conditions, and cigarette smokers are at the highest risk. Before there was a vaccine, the [place] saw:  more than 700 cases of meningitis,  about 13,000 blood infections,  about  5 million ear infections, and  about 200 deaths  in children under 5 each year from pneumococcal disease. Since vaccine became available, severe pneumococcal disease in these children has fallen by 88%. About 18,000 older adults die of pneumococcal disease each year in the [place]. Treatment of pneumococcal infections with penicillin and other drugs is not as effective as it used to be, because some strains of the disease have become resistant to these drugs. This makes prevention of the disease, through vaccination, even more important. 2. PCV13 vaccine Pneumococcal conjugate vaccine (called PCV13) protects against 13 types of pneumococcal bacteria. PCV13 is routinely given to children at 2, 4, 6, and 79-59 months of age. It is also recommended for children and adults 85 to 47 years of age with certain health conditions, and for all adults 56 years of age and older. Your doctor can give you details. 3. Some people should not get this vaccine Anyone who has ever had a life-threatening allergic reaction to a dose of this vaccine, to an earlier pneumococcal vaccine called PCV7, or to any vaccine containing diphtheria toxoid (for example, DTaP), should not get PCV13. Anyone with a severe allergy to any component of PCV13 should not get the vaccine. Tell your doctor if the person being vaccinated has any severe allergies. If the person scheduled for vaccination is not feeling well, your healthcare provider might decide to reschedule the shot on another day. 4. Risks of a vaccine reaction With any medicine, including vaccines, there is a chance of reactions. These are usually mild and go away on their own, but serious reactions are also possible. Problems reported following PCV13 varied by age and dose in the series. The most common problems reported among children were:  About half became drowsy after the shot, had a temporary loss of appetite, or had redness or tenderness where the shot was given.  About 1 out of 3 had swelling where the shot was given.  About 1 out of 3 had a mild fever, and about 1 in 20 had a fever over 102.57F.  Up to about 8 out of 10 became fussy or irritable.  Adults have reported pain, redness, and swelling where the shot was given; also mild  fever, fatigue, headache, chills, or muscle pain. Young children who get PCV13 along with inactivated flu vaccine at the same time may be at increased risk for seizures caused by fever. Ask your doctor for more information. Problems that could happen after any vaccine:  People sometimes faint after a medical procedure, including vaccination. Sitting or lying down for about 15 minutes can help prevent fainting, and injuries caused by a fall. Tell your doctor if you feel dizzy, or have vision changes or ringing in the ears.  Some older children and adults get severe pain in the shoulder and have difficulty moving the arm where a shot was given. This happens very rarely.  Any medication can cause a severe allergic reaction. Such reactions from a vaccine are very rare, estimated at about 1 in a million doses, and would happen within a few minutes to a few hours after the vaccination. As with any medicine, there is a very small chance of a vaccine causing a serious injury or death. The safety of vaccines is always being monitored. For more information, visit: http://www.aguilar.org/ 5. What if there is a serious reaction? What should I look for? Look for anything that concerns you, such as signs of a severe allergic reaction, very high fever,  or unusual behavior. Signs of a severe allergic reaction can include hives, swelling of the face and throat, difficulty breathing, a fast heartbeat, dizziness, and weakness-usually within a few minutes to a few hours after the vaccination. What should I do?  If you think it is a severe allergic reaction or other emergency that can't wait, call 9-1-1 or get the person to the nearest hospital. Otherwise, call your doctor.  Reactions should be reported to the Vaccine Adverse Event Reporting System (VAERS). Your doctor should file this report, or you can do it yourself through the VAERS web site at www.vaers.SamedayNews.es, or by calling (316)365-9033. ? VAERS does not  give medical advice. 6. The National Vaccine Injury Compensation Program The [employer] Vaccine Injury Compensation Program (VICP) is a federal program that was created to compensate people who may have been injured by certain vaccines. Persons who believe they may have been injured by a vaccine can learn about the program and about filing a claim by calling 779-206-1813 or visiting the [place] website at GoldCloset.com.ee. There is a time limit to file a claim for compensation. 7. How can I learn more?  Ask your healthcare provider. He or she can give you the vaccine package insert or suggest other sources of information.  Call your local or state health department.  Contact the Centers for Disease Control and Prevention (CDC): ? Call (713)315-9248 (1-800-CDC-INFO) or ? Visit CDC's website at http://hunter.com/ Vaccine Information Statement, PCV13 Vaccine (01/09/2014) This information is not intended to replace advice given to you by your health care provider. Make sure you discuss any questions you have with your health care provider. Document Released: 12/19/2005 Document Revised: 11/12/2015 Document Reviewed: 11/12/2015 Elsevier Interactive Patient Education  2017 [employer].

## 2017-03-23 ENCOUNTER — Ambulatory Visit: Payer: Medicare Other | Admitting: Family Medicine

## 2017-03-23 ENCOUNTER — Encounter: Payer: Self-pay | Admitting: Family Medicine

## 2017-03-23 VITALS — BP 119/79 | HR 84 | Temp 99.1°F | Wt 154.2 lb

## 2017-03-23 DIAGNOSIS — J4541 Moderate persistent asthma with (acute) exacerbation: Secondary | ICD-10-CM | POA: Diagnosis not present

## 2017-03-23 DIAGNOSIS — J45901 Unspecified asthma with (acute) exacerbation: Secondary | ICD-10-CM | POA: Insufficient documentation

## 2017-03-23 DIAGNOSIS — J45909 Unspecified asthma, uncomplicated: Secondary | ICD-10-CM | POA: Insufficient documentation

## 2017-03-23 MED ORDER — BENZONATATE 200 MG PO CAPS: 200.0000 mg | ORAL_CAPSULE | Freq: Two times a day (BID) | ORAL | 0 refills | Status: DC | PRN

## 2017-03-23 MED ORDER — AZITHROMYCIN 250 MG PO TABS
ORAL_TABLET | ORAL | 0 refills | Status: DC
Start: 2017-03-23 — End: 2017-08-25

## 2017-03-23 MED ORDER — HYDROCOD POLST-CPM POLST ER 10-8 MG/5ML PO SUER: 5.0000 mL | Freq: Every evening | ORAL | 0 refills | Status: DC | PRN

## 2017-03-23 MED ORDER — PREDNISONE 10 MG PO TABS: ORAL_TABLET | ORAL | 0 refills | Status: DC

## 2017-03-23 MED ORDER — ALBUTEROL SULFATE (2.5 MG/3ML) 0.083% IN NEBU
2.5000 mg | INHALATION_SOLUTION | Freq: Once | RESPIRATORY_TRACT | Status: AC
  Administered 2017-03-23: 2.5 mg via RESPIRATORY_TRACT

## 2017-03-23 NOTE — Assessment & Plan Note (Addendum)
Slightly better following neb. Will treat with 12 day taper of prednisone, tussionex, tessalon perles and azithromycin. Call with any concerns. Recheck breathing 2 weeks either here or with pulmonology.

## 2017-03-23 NOTE — Progress Notes (Signed)
BP 119/79 (BP Location: Left Arm, Patient Position: Sitting, Cuff Size: Normal)   Pulse 84   Temp 99.1 F (37.3 C)   Wt 154 lb 3 oz (69.9 kg)   SpO2 98%   BMI 25.27 kg/m    Subjective:    Patient ID: Andrea Savage, female    DOB: [birth year], 72 y.o.   MRN: [id number]  HPI: Andrea Savage is a 72 y.o. female  Chief Complaint  Patient presents with  . Cough    Patient states that she has seen her Pulmonologist, was given prednisone, but is still coughing really bad and feels tight in her chest, she has been sick for two weeks.    UPPER RESPIRATORY TRACT INFECTION- spoke to her pulmonologist, but has not seen anyone. Took the prednisone last week Duration: 2 weeks Worst symptom: cough Fever: yes Cough: yes Shortness of breath: yes Wheezing: yes Chest pain: no Chest tightness: yes Chest congestion: yes Nasal congestion: yes Runny nose: yes Post nasal drip: yes Sneezing: no Sore throat: yes Swollen glands: no Sinus pressure: no Headache: no Face pain: no Toothache: no Ear pain: no  Ear pressure: no  Eyes red/itching:no Eye drainage/crusting: no  Vomiting: no Rash: no Fatigue: yes Sick contacts: yes Strep contacts: no  Context: worse Recurrent sinusitis: no Relief with OTC cold/cough medications: no  Treatments attempted: prednisone, cold/sinus, mucinex and pseudoephedrine   Relevant past medical, surgical, family and social history reviewed and updated as indicated. Interim medical history since our last visit reviewed. Allergies and medications reviewed and updated.  Review of Systems  Constitutional: Negative.   HENT: Positive for congestion, postnasal drip and rhinorrhea. Negative for dental problem, drooling, ear discharge, ear pain, facial swelling, hearing loss, mouth sores, nosebleeds, sinus pressure, sinus pain, sneezing, sore throat, tinnitus, trouble swallowing and voice change.   Respiratory: Positive for cough, chest tightness, shortness  of breath and wheezing. Negative for apnea, choking and stridor.   Cardiovascular: Negative.   Psychiatric/Behavioral: Negative.     Per HPI unless specifically indicated above     Objective:    BP 119/79 (BP Location: Left Arm, Patient Position: Sitting, Cuff Size: Normal)   Pulse 84   Temp 99.1 F (37.3 C)   Wt 154 lb 3 oz (69.9 kg)   SpO2 98%   BMI 25.27 kg/m   Wt Readings from Last 3 Encounters:  03/23/17 154 lb 3 oz (69.9 kg)  11/30/16 157 lb 8 oz (71.4 kg)  08/08/16 161 lb (73 kg)    Physical Exam  Constitutional: She is oriented to person, place, and time. She appears well-developed and well-nourished. No distress.  HENT:  Head: Normocephalic and atraumatic.  Right Ear: Hearing normal.  Left Ear: Hearing normal.  Nose: Nose normal.  Eyes: Conjunctivae and lids are normal. Right eye exhibits no discharge. Left eye exhibits no discharge. No scleral icterus.  Cardiovascular: Normal rate, regular rhythm, normal heart sounds and intact distal pulses. Exam reveals no gallop and no friction rub.  No murmur heard. Pulmonary/Chest: Effort normal. No respiratory distress. She has wheezes in the right upper field, the right middle field, the right lower field, the left upper field, the left middle field and the left lower field. She has rhonchi in the right lower field and the left lower field. She has no rales. She exhibits no tenderness.  Musculoskeletal: Normal range of motion.  Neurological: She is alert and oriented to person, place, and time.  Skin: Skin is warm, dry and  intact. No rash noted. She is not diaphoretic. No erythema. No pallor.  Psychiatric: She has a normal mood and affect. Her speech is normal and behavior is normal. Judgment and thought content normal. Cognition and memory are normal.  Nursing note and vitals reviewed.   Results for orders placed or performed in visit on 06/03/16  CBC with Differential/Platelet  Result Value Ref Range   WBC 3.8 3.4 - 10.8  x10E3/uL   RBC 4.29 3.77 - 5.28 x10E6/uL   Hemoglobin 13.7 11.1 - 15.9 g/dL   Hematocrit 39.6 34.0 - 46.6 %   MCV 92 79 - 97 fL   MCH 31.9 26.6 - 33.0 pg   MCHC 34.6 31.5 - 35.7 g/dL   RDW 14.3 12.3 - 15.4 %   Platelets 202 150 - 379 x10E3/uL   Neutrophils 46 Not Estab. %   Lymphs 40 Not Estab. %   Monocytes 11 Not Estab. %   Eos 2 Not Estab. %   Basos 1 Not Estab. %   Neutrophils Absolute 1.8 1.4 - 7.0 x10E3/uL   Lymphocytes Absolute 1.5 0.7 - 3.1 x10E3/uL   Monocytes Absolute 0.4 0.1 - 0.9 x10E3/uL   EOS (ABSOLUTE) 0.1 0.0 - 0.4 x10E3/uL   Basophils Absolute 0.0 0.0 - 0.2 x10E3/uL   Immature Granulocytes 0 Not Estab. %   Immature Grans (Abs) 0.0 0.0 - 0.1 x10E3/uL  Comprehensive metabolic panel  Result Value Ref Range   Glucose 82 65 - 99 mg/dL   BUN 25 8 - 27 mg/dL   Creatinine, Ser 0.92 0.57 - 1.00 mg/dL   GFR calc non Af Amer 63 >59 mL/min/1.73   GFR calc Af Amer 73 >59 mL/min/1.73   BUN/Creatinine Ratio 27 12 - 28   Sodium 137 134 - 144 mmol/L   Potassium 4.1 3.5 - 5.2 mmol/L   Chloride 98 96 - 106 mmol/L   CO2 26 18 - 29 mmol/L   Calcium 9.4 8.7 - 10.3 mg/dL   Total Protein 6.5 6.0 - 8.5 g/dL   Albumin 4.2 3.5 - 4.8 g/dL   Globulin, Total 2.3 1.5 - 4.5 g/dL   Albumin/Globulin Ratio 1.8 1.2 - 2.2   Bilirubin Total 0.4 0.0 - 1.2 mg/dL   Alkaline Phosphatase 80 39 - 117 IU/L   AST 25 0 - 40 IU/L   ALT 20 0 - 32 IU/L  Lipid Panel w/o Chol/HDL Ratio  Result Value Ref Range   Cholesterol, Total 212 (H) 100 - 199 mg/dL   Triglycerides 128 0 - 149 mg/dL   HDL 57 >39 mg/dL   VLDL Cholesterol Cal 26 5 - 40 mg/dL   LDL Calculated 129 (H) 0 - 99 mg/dL  Thyroid Panel With TSH  Result Value Ref Range   TSH 2.740 0.450 - 4.500 uIU/mL   T4, Total 6.7 4.5 - 12.0 ug/dL   T3 Uptake Ratio 27 24 - 39 %   Free Thyroxine Index 1.8 1.2 - 4.9  VITAMIN D 25 Hydroxy (Vit-D Deficiency, Fractures)  Result Value Ref Range   Vit D, 25-Hydroxy 21.2 (L) 30.0 - 100.0 ng/mL  B12 and  Folate Panel  Result Value Ref Range   Vitamin B-12 534 232 - 1,245 pg/mL   Folate 19.4 >3.0 ng/mL  Bayer DCA Hb A1c Waived  Result Value Ref Range   Bayer DCA Hb A1c Waived 5.2 <7.0 %      Assessment & Plan:   Problem List Items Addressed This Visit      Respiratory  Asthma - Primary    Slightly better following neb. Will treat with 12 day taper of prednisone, tussionex, tessalon perles and azithromycin. Call with any concerns. Recheck breathing 2 weeks either here or with pulmonology.      Relevant Medications   beclomethasone (QVAR REDIHALER) 80 MCG/ACT inhaler   albuterol (PROVENTIL) (2.5 MG/3ML) 0.083% nebulizer solution 2.5 mg   predniSONE (DELTASONE) 10 MG tablet       Follow up plan: Return 2 weeks, for follow up breathing.

## 2017-07-15 ENCOUNTER — Other Ambulatory Visit: Payer: Self-pay

## 2017-07-15 ENCOUNTER — Encounter: Payer: Self-pay | Admitting: Emergency Medicine

## 2017-07-15 ENCOUNTER — Emergency Department
Admission: EM | Admit: 2017-07-15 | Discharge: 2017-07-15 | Disposition: A | Discharge status: Home / Self Care | Payer: Medicare Other | Attending: Emergency Medicine | Admitting: Emergency Medicine

## 2017-07-15 DIAGNOSIS — Z974 Presence of external hearing-aid: Secondary | ICD-10-CM | POA: Insufficient documentation

## 2017-07-15 DIAGNOSIS — Y939 Activity, unspecified: Secondary | ICD-10-CM | POA: Insufficient documentation

## 2017-07-15 DIAGNOSIS — T161XXA Foreign body in right ear, initial encounter: Secondary | ICD-10-CM | POA: Diagnosis present

## 2017-07-15 DIAGNOSIS — Y999 Unspecified external cause status: Secondary | ICD-10-CM | POA: Diagnosis not present

## 2017-07-15 DIAGNOSIS — Y92009 Unspecified place in unspecified non-institutional (private) residence as the place of occurrence of the external cause: Secondary | ICD-10-CM | POA: Insufficient documentation

## 2017-07-15 DIAGNOSIS — X58XXXA Exposure to other specified factors, initial encounter: Secondary | ICD-10-CM | POA: Insufficient documentation

## 2017-07-15 NOTE — ED Triage Notes (Signed)
Pt here from urgent care with c/o hearing device in right ear, states they flushed it at urgent care and sent her here-unable to get it out.

## 2017-07-15 NOTE — ED Provider Notes (Signed)
[facility]  Time seen: Approximately 12:41 PM  I have reviewed the triage vital signs and the nursing notes.   HISTORY  Chief Complaint Foreign Body in Ear   HPI Andrea Savage is a 72 y.o. female presented for evaluation of right ear foreign body.  Patient reports that she bilateral hearing aids, and reports late last night when removing the hearing aid the rubber backing remained in the ear.  States they were unable to get it out at home themselves, and reports that she was seen by urgent care earlier this morning and also was unable to remove.  Patient reports that they attempted flushing multiple times as well as manually removing without success.  States the area is mildly tender to the right ear only.  States decreased hearing to her right ear.  States that she has chronic bilateral hearing loss.  Denies recent sickness.  Reports otherwise feels well and denies other complaints.   Past Medical History:  Diagnosis Date  . Asthma   . Depression   . Fibromyalgia   . GERD (gastroesophageal reflux disease)   . IBS (irritable bowel syndrome)   . Low bone density   . Osteopenia   . Reflux   . Rosacea     Patient Active Problem List   Diagnosis Date Noted  . Asthma 03/23/2017  . Fibromyalgia 03/10/2016  . Sensorineural hearing loss (SNHL) of left ear with restricted hearing of right ear 12/29/2015  . Post herpetic neuralgia 11/12/2015  . Depression 06/29/2015  . Osteopenia   . Reflux   . Rosacea     Past Surgical History:  Procedure Laterality Date  . ABDOMINAL HYSTERECTOMY  2009   Total  . BREAST EXCISIONAL BIOPSY Left 1979   NEG  . COLONOSCOPY  [id number]   repeat 10 years  . FOOT SURGERY Right 1990  . HAND SURGERY Right   . LAPAROSCOPIC OOPHERECTOMY    . TONSILLECTOMY       No current facility-administered medications for this encounter.   Current Outpatient Medications:  .  albuterol (PROVENTIL HFA;VENTOLIN HFA) 108 (90 Base)  MCG/ACT inhaler, Inhale 2 puffs into the lungs every 6 (six) hours as needed for wheezing or shortness of breath., Disp: 1 Inhaler, Rfl: 6 .  amitriptyline (ELAVIL) 25 MG tablet, Take 1 tablet (25 mg total) by mouth at bedtime. (Patient not taking: Reported on 03/23/2017), Disp: 90 tablet, Rfl: 3 .  azithromycin (ZITHROMAX) 250 MG tablet, 2 tabs today and then 1 tab daily for 4 days, Disp: 6 tablet, Rfl: 0 .  benzonatate (TESSALON) 200 MG capsule, Take 1 capsule (200 mg total) by mouth 2 (two) times daily as needed for cough., Disp: 20 capsule, Rfl: 0 .  chlorpheniramine-HYDROcodone (TUSSIONEX PENNKINETIC ER) 10-8 MG/5ML SUER, Take 5 mLs by mouth at bedtime as needed., Disp: 140 mL, Rfl: 0 .  citalopram (CELEXA) 40 MG tablet, Take 1 tablet (20 mg total) by mouth daily., Disp: 90 tablet, Rfl: 1 .  fluticasone (FLONASE) 50 MCG/ACT nasal spray, Place 2 sprays into both nostrils daily. , Disp: , Rfl:  .  gabapentin (NEURONTIN) 100 MG capsule, Take 1-3 capsules (100-300 mg total) by mouth 3 (three) times daily. (Patient not taking: Reported on 03/23/2017), Disp: 270 capsule, Rfl: 3 .  montelukast (SINGULAIR) 10 MG tablet, Take 10 mg by mouth at bedtime., Disp: , Rfl:  .  Naproxen Sodium (ALEVE) 220 MG CAPS, Take by mouth every evening. , Disp: , Rfl:  .  omeprazole (  PRILOSEC) 20 MG capsule, Take 20 mg by mouth daily., Disp: , Rfl:  .  predniSONE (DELTASONE) 10 MG tablet, 6 tabs today and tomorrow, 5 tabs next 2 days, decrease by 1 every other day until gone, Disp: 42 tablet, Rfl: 0 .  UNABLE TO FIND, Med Name: terratol, Disp: , Rfl:   Allergies Compazine [prochlorperazine]  Family History  Problem Relation Age of Onset  . Cancer Mother        breast  . Breast cancer Mother   . Cancer Father        pancreatic  . Liver disease Sister   . Diabetes Sister   . Thyroid disease Sister   . Thyroid disease Brother   . Kidney disease Brother     Social History Social History   Tobacco Use  .  Smoking status: Never Smoker  . Smokeless tobacco: Never Used  Substance Use Topics  . Alcohol use: Yes    Comment: on occasion  . Drug use: No    Review of Systems Constitutional: No fever/chills ENT: No sore throat. As above. Cardiovascular: Denies chest pain. Respiratory: Denies shortness of breath. Skin: Negative for rash.   ____________________________________________   PHYSICAL EXAM:  VITAL SIGNS: ED Triage Vitals  Enc Vitals Group     BP 07/15/17 1224 (!) 172/77     Pulse Rate 07/15/17 1224 72     Resp 07/15/17 1224 16     Temp 07/15/17 1224 98.8 F (37.1 C)     Temp Source 07/15/17 1224 Oral     SpO2 07/15/17 1224 98 %     Weight 07/15/17 1225 155 lb (70.3 kg)     Height 07/15/17 1225 5\' 5"  (1.651 m)     Head Circumference --      Peak Flow --      Pain Score 07/15/17 1225 0     Pain Loc --      Pain Edu? --      Excl. in [place]? --    Constitutional: Alert and oriented. Well appearing and in no acute distress. Eyes: Conjunctivae are normal. Head: Atraumatic. No sinus tenderness to palpation. No swelling. No erythema.  Ears: Left: nontender, normal canal, no erythema, normal TM. Right: Nontender, foreign body blackish color present in canal, post foreign body removal, canal clear with minimal erythema, no edema, otherwise normal TM.  No surrounding tenderness or swelling bilaterally.  Nose:No nasal congestion  Mouth/Throat: Mucous membranes are moist. Cardiovascular: Normal rate, regular rhythm. Grossly normal heart sounds.  Respiratory: Normal respiratory effort.  No retractions. No wheezes, rales or rhonchi. Good air movement.  Musculoskeletal: Ambulatory with steady gait.  Neurologic:  Normal speech and language. No gait instability. Skin:  Skin appears warm, dry and intact. No rash noted. Psychiatric: Mood and affect are normal. Speech and behavior are normal.  ___________________________________________   LABS (all labs ordered are listed, but only  abnormal results are displayed)  Labs Reviewed - No data to display   PROCEDURES Procedures   Procedure explained and verbal consent obtained.  Sterile alligator forceps utilized to removed foreign body and right ear canal.  Foreign body black rubberish backing to hearing aid.  Patient reports immediately feeling better after removal, with improved hearing.  Patient tolerated well.  INITIAL IMPRESSION / ASSESSMENT AND PLAN / ED COURSE  Pertinent labs & imaging results that were available during my care of the patient were reviewed by me and considered in my medical decision making (see chart for details).  Well-appearing patient.  No acute distress.  Right ear foreign body, removed with forceps at bedside.  Patient tolerated well.  Encouraged supportive care and follow-up as needed.  Discussed follow up and return parameters including no resolution or any worsening concerns. Patient verbalized understanding and agreed to plan.   ____________________________________________   FINAL CLINICAL IMPRESSION(S) / ED DIAGNOSES  Final diagnoses:  Acute foreign body of right ear, initial encounter     ED Discharge Orders    None       Note: This dictation was prepared with Dragon dictation along with smaller phrase technology. Any transcriptional errors that result from this process are unintentional.         Marylene Land, NP 07/15/17 1250    Lavonia Drafts, MD 07/15/17 1345

## 2017-08-25 ENCOUNTER — Ambulatory Visit: Payer: Medicare Other | Admitting: Physician Assistant

## 2017-08-25 ENCOUNTER — Encounter: Payer: Self-pay | Admitting: Physician Assistant

## 2017-08-25 VITALS — BP 133/80 | HR 93 | Wt 155.0 lb

## 2017-08-25 DIAGNOSIS — J45901 Unspecified asthma with (acute) exacerbation: Secondary | ICD-10-CM | POA: Diagnosis not present

## 2017-08-25 MED ORDER — PREDNISONE 10 MG (21) PO TBPK: ORAL_TABLET | ORAL | 0 refills | Status: DC

## 2017-08-25 MED ORDER — HYDROCOD POLST-CPM POLST ER 10-8 MG/5ML PO SUER: 5.0000 mL | Freq: Every evening | ORAL | 0 refills | Status: DC | PRN

## 2017-08-25 NOTE — Patient Instructions (Signed)

## 2017-08-25 NOTE — Progress Notes (Signed)
Subjective:    Patient ID: Andrea Savage, female    DOB: Jun 26, 1945, 72 y.o.   MRN: [id number]  Andrea Savage is a 72 y.o. female presenting on 08/25/2017 for Cough (Since Wednesday. Pt states she has asthma so anytime she gets a bug her cough gets bad)   HPI   Has a history of asthma. Has been having SOB. Says this clinic smells like mold and it sets off her breathing. Says she "won't kick this without Tussionex." No fevers, chills, N/V. Has been using her albuterol inhaler frequently.    Social History   Tobacco Use  . Smoking status: Never Smoker  . Smokeless tobacco: Never Used  Substance Use Topics  . Alcohol use: Yes    Comment: on occasion  . Drug use: No    Review of Systems Per HPI unless specifically indicated above     Objective:    BP 133/80   Pulse 93   Wt 155 lb (70.3 kg)   SpO2 98%   BMI 25.79 kg/m   Wt Readings from Last 3 Encounters:  08/25/17 155 lb (70.3 kg)  07/15/17 155 lb (70.3 kg)  03/23/17 154 lb 3 oz (69.9 kg)    Physical Exam  Constitutional: She is oriented to person, place, and time. She appears well-developed and well-nourished.  Cardiovascular: Normal rate and regular rhythm.  Pulmonary/Chest: Breath sounds normal. No respiratory distress. She has no wheezes. She has no rhonchi.  Upon climbing onto exam table, patient begins to breathe heavily and then transitions to supine position. Says the mold in this building may be setting her off. No accessory muscle usage. Lungs well ventilated on exam, no wheezing. Pulse ox 98%.  Neurological: She is alert and oriented to person, place, and time.  Skin: Skin is warm and dry.  Psychiatric: She has a normal mood and affect. Her behavior is normal.   Results for orders placed or performed in visit on 06/03/16  CBC with Differential/Platelet  Result Value Ref Range   WBC 3.8 3.4 - 10.8 x10E3/uL   RBC 4.29 3.77 - 5.28 x10E6/uL   Hemoglobin 13.7 11.1 - 15.9 g/dL   Hematocrit 39.6 34.0  - 46.6 %   MCV 92 79 - 97 fL   MCH 31.9 26.6 - 33.0 pg   MCHC 34.6 31.5 - 35.7 g/dL   RDW 14.3 12.3 - 15.4 %   Platelets 202 150 - 379 x10E3/uL   Neutrophils 46 Not Estab. %   Lymphs 40 Not Estab. %   Monocytes 11 Not Estab. %   Eos 2 Not Estab. %   Basos 1 Not Estab. %   Neutrophils Absolute 1.8 1.4 - 7.0 x10E3/uL   Lymphocytes Absolute 1.5 0.7 - 3.1 x10E3/uL   Monocytes Absolute 0.4 0.1 - 0.9 x10E3/uL   EOS (ABSOLUTE) 0.1 0.0 - 0.4 x10E3/uL   Basophils Absolute 0.0 0.0 - 0.2 x10E3/uL   Immature Granulocytes 0 Not Estab. %   Immature Grans (Abs) 0.0 0.0 - 0.1 x10E3/uL  Comprehensive metabolic panel  Result Value Ref Range   Glucose 82 65 - 99 mg/dL   BUN 25 8 - 27 mg/dL   Creatinine, Ser 0.92 0.57 - 1.00 mg/dL   GFR calc non Af Amer 63 >59 mL/min/1.73   GFR calc Af Amer 73 >59 mL/min/1.73   BUN/Creatinine Ratio 27 12 - 28   Sodium 137 134 - 144 mmol/L   Potassium 4.1 3.5 - 5.2 mmol/L   Chloride 98 96 -  106 mmol/L   CO2 26 18 - 29 mmol/L   Calcium 9.4 8.7 - 10.3 mg/dL   Total Protein 6.5 6.0 - 8.5 g/dL   Albumin 4.2 3.5 - 4.8 g/dL   Globulin, Total 2.3 1.5 - 4.5 g/dL   Albumin/Globulin Ratio 1.8 1.2 - 2.2   Bilirubin Total 0.4 0.0 - 1.2 mg/dL   Alkaline Phosphatase 80 39 - 117 IU/L   AST 25 0 - 40 IU/L   ALT 20 0 - 32 IU/L  Lipid Panel w/o Chol/HDL Ratio  Result Value Ref Range   Cholesterol, Total 212 (H) 100 - 199 mg/dL   Triglycerides 128 0 - 149 mg/dL   HDL 57 >39 mg/dL   VLDL Cholesterol Cal 26 5 - 40 mg/dL   LDL Calculated 129 (H) 0 - 99 mg/dL  Thyroid Panel With TSH  Result Value Ref Range   TSH 2.740 0.450 - 4.500 uIU/mL   T4, Total 6.7 4.5 - 12.0 ug/dL   T3 Uptake Ratio 27 24 - 39 %   Free Thyroxine Index 1.8 1.2 - 4.9  VITAMIN D 25 Hydroxy (Vit-D Deficiency, Fractures)  Result Value Ref Range   Vit D, 25-Hydroxy 21.2 (L) 30.0 - 100.0 ng/mL  B12 and Folate Panel  Result Value Ref Range   Vitamin B-12 534 232 - 1,245 pg/mL   Folate 19.4 >3.0 ng/mL    Bayer DCA Hb A1c Waived  Result Value Ref Range   HB A1C (BAYER DCA - WAIVED) 5.2 <7.0 %      Assessment & Plan:   1. Mild asthma with exacerbation, unspecified whether persistent  Lungs sound quite clear on exam today. Per patient report she is wheezing and she requests Tussionex as she reports she won't get better without it. Treat as below, continue with albuterol. Do not see need for abx at this time.   - predniSONE (STERAPRED UNI-PAK 21 TAB) 10 MG (21) TBPK tablet; Take 6 pills on day 1, 5 pills on day 2, and so on until complete.  Dispense: 21 tablet; Refill: 0 - chlorpheniramine-HYDROcodone (TUSSIONEX PENNKINETIC ER) 10-8 MG/5ML SUER; Take 5 mLs by mouth at bedtime as needed for cough.  Dispense: 140 mL; Refill: 0    Follow up plan: No follow-ups on file.   Carles Collet, PA-C [facility] 08/28/2017, 4:03 PM

## 2017-08-28 ENCOUNTER — Other Ambulatory Visit: Payer: Self-pay | Admitting: Podiatry

## 2017-08-30 ENCOUNTER — Telehealth: Payer: Self-pay

## 2017-08-30 MED ORDER — AZITHROMYCIN 250 MG PO TABS: ORAL_TABLET | ORAL | 0 refills | Status: DC

## 2017-08-30 NOTE — Telephone Encounter (Signed)
Routing to CW who is in office.  I don't see an antibiotic in patient's chart.  Surgery with Dr. Elvina Mattes.  Last time she was seen was for Asthma, no antibiotic mentioned.

## 2017-08-30 NOTE — Telephone Encounter (Signed)
Copied from [place] [id number]. Topic: General - Other >> Aug 29, 2017 10:15 AM Carolyn Stare wrote:  Pt said she saw the PA last week and is asking for antibiotic because she is coughing and blowing mucus.   Pharmacy [facility]  >> Aug 30, 2017 11:01 AM Selinda Flavin B, NT wrote: Patient calling and is scheduled for surgery on 09/14/17. States that the surgeon wants her on an antibiotic before the surgery. Would like a call back with any decision. States that she does not want to wait until tomorrow's pre-op appointment to get the antibiotic. CB#: [phone]

## 2017-08-30 NOTE — Telephone Encounter (Signed)
Patient notified

## 2017-08-31 ENCOUNTER — Encounter: Payer: Self-pay | Admitting: Physician Assistant

## 2017-08-31 ENCOUNTER — Ambulatory Visit: Payer: Medicare Other | Admitting: Physician Assistant

## 2017-08-31 VITALS — BP 128/81 | HR 77 | Temp 98.2°F | Ht 64.5 in | Wt 157.6 lb

## 2017-08-31 DIAGNOSIS — Z01818 Encounter for other preprocedural examination: Secondary | ICD-10-CM

## 2017-08-31 NOTE — Progress Notes (Signed)
Subjective:    Patient ID: Andrea Savage, female    DOB: [birth year], 72 y.o.   MRN: [id number]  Andrea Savage is a 72 y.o. female presenting on 08/31/2017 for Surigcal Clearance   HPI   Patient presents for pre-op evaluation for weil osteotomy of 2nd and 3rd toes to be performed by Dr. Elvina Mattes. Recovering from an asthma exacerbation, currently on z-pack, prednisone, and albuterol inhaler. She does have nebulizer at home.  Cites no issues with anesthesia, no hospitalizations. No history of easy or excessive bleeding, not on blood thinners. No cardiac or other chronic issues. She does take aleve two tablets daily for back pain.  Social History   Tobacco Use  . Smoking status: Never Smoker  . Smokeless tobacco: Never Used  Substance Use Topics  . Alcohol use: Yes    Comment: on occasion  . Drug use: No    Review of Systems Per HPI unless specifically indicated above     Objective:    BP 128/81 (BP Location: Left Arm, Patient Position: Sitting, Cuff Size: Normal)   Pulse 77   Temp 98.2 F (36.8 C) (Oral)   Ht 5' 4.5" (1.638 m)   Wt 157 lb 9.6 oz (71.5 kg)   SpO2 99%   BMI 26.63 kg/m   Wt Readings from Last 3 Encounters:  08/31/17 157 lb 9.6 oz (71.5 kg)  08/25/17 155 lb (70.3 kg)  07/15/17 155 lb (70.3 kg)    Physical Exam  Constitutional: She is oriented to person, place, and time. She appears well-developed and well-nourished.  HENT:  Right Ear: External ear normal.  Left Ear: External ear normal.  Mouth/Throat: No oropharyngeal exudate.    Neck: Normal range of motion. Neck supple.  Cardiovascular: Normal rate, regular rhythm and normal heart sounds.  Pulmonary/Chest: Effort normal. She has wheezes.  Wheezes in lung bases bilaterally.  Abdominal: Soft. Bowel sounds are normal. There is no tenderness.  Lymphadenopathy:    She has no cervical adenopathy.  Neurological: She is alert and oriented to person, place, and time.  Skin: Skin is warm and  dry. No rash noted.  Psychiatric: She has a normal mood and affect. Her behavior is normal.   Results for orders placed or performed in visit on 06/03/16  CBC with Differential/Platelet  Result Value Ref Range   WBC 3.8 3.4 - 10.8 x10E3/uL   RBC 4.29 3.77 - 5.28 x10E6/uL   Hemoglobin 13.7 11.1 - 15.9 g/dL   Hematocrit 39.6 34.0 - 46.6 %   MCV 92 79 - 97 fL   MCH 31.9 26.6 - 33.0 pg   MCHC 34.6 31.5 - 35.7 g/dL   RDW 14.3 12.3 - 15.4 %   Platelets 202 150 - 379 x10E3/uL   Neutrophils 46 Not Estab. %   Lymphs 40 Not Estab. %   Monocytes 11 Not Estab. %   Eos 2 Not Estab. %   Basos 1 Not Estab. %   Neutrophils Absolute 1.8 1.4 - 7.0 x10E3/uL   Lymphocytes Absolute 1.5 0.7 - 3.1 x10E3/uL   Monocytes Absolute 0.4 0.1 - 0.9 x10E3/uL   EOS (ABSOLUTE) 0.1 0.0 - 0.4 x10E3/uL   Basophils Absolute 0.0 0.0 - 0.2 x10E3/uL   Immature Granulocytes 0 Not Estab. %   Immature Grans (Abs) 0.0 0.0 - 0.1 x10E3/uL  Comprehensive metabolic panel  Result Value Ref Range   Glucose 82 65 - 99 mg/dL   BUN 25 8 - 27 mg/dL   Creatinine, Ser 0.92  0.57 - 1.00 mg/dL   GFR calc non Af Amer 63 >59 mL/min/1.73   GFR calc Af Amer 73 >59 mL/min/1.73   BUN/Creatinine Ratio 27 12 - 28   Sodium 137 134 - 144 mmol/L   Potassium 4.1 3.5 - 5.2 mmol/L   Chloride 98 96 - 106 mmol/L   CO2 26 18 - 29 mmol/L   Calcium 9.4 8.7 - 10.3 mg/dL   Total Protein 6.5 6.0 - 8.5 g/dL   Albumin 4.2 3.5 - 4.8 g/dL   Globulin, Total 2.3 1.5 - 4.5 g/dL   Albumin/Globulin Ratio 1.8 1.2 - 2.2   Bilirubin Total 0.4 0.0 - 1.2 mg/dL   Alkaline Phosphatase 80 39 - 117 IU/L   AST 25 0 - 40 IU/L   ALT 20 0 - 32 IU/L  Lipid Panel w/o Chol/HDL Ratio  Result Value Ref Range   Cholesterol, Total 212 (H) 100 - 199 mg/dL   Triglycerides 128 0 - 149 mg/dL   HDL 57 >39 mg/dL   VLDL Cholesterol Cal 26 5 - 40 mg/dL   LDL Calculated 129 (H) 0 - 99 mg/dL  Thyroid Panel With TSH  Result Value Ref Range   TSH 2.740 0.450 - 4.500 uIU/mL   T4,  Total 6.7 4.5 - 12.0 ug/dL   T3 Uptake Ratio 27 24 - 39 %   Free Thyroxine Index 1.8 1.2 - 4.9  VITAMIN D 25 Hydroxy (Vit-D Deficiency, Fractures)  Result Value Ref Range   Vit D, 25-Hydroxy 21.2 (L) 30.0 - 100.0 ng/mL  B12 and Folate Panel  Result Value Ref Range   Vitamin B-12 534 232 - 1,245 pg/mL   Folate 19.4 >3.0 ng/mL  Bayer DCA Hb A1c Waived  Result Value Ref Range   HB A1C (BAYER DCA - WAIVED) 5.2 <7.0 %      Assessment & Plan:  1. Pre-op evaluation  Her EKG shows normal sinus rhythm, stable V1 inversions from prior 2013 EKG. She has no chronic illnesses, anesthesia problems, no blood thinners. She takes Aleve daily and she has been advised to stop this one week prior to surgery. Her lungs have some wheezing in the bases and I would like to recheck her before signing off on surgical clearance. I have called the podiatry clinic and let them know the status of this pre-op evaluation and that her clearance is pending her lung exam on recheck. Will fax form upon recheck.  - EKG 12-Lead  I have spent 25 minutes with this patient, >50% was spent on counseling and coordination of care.    Follow up plan: Return in about 4 days (around 09/04/2017) for lung recheck.  Carles Collet, PA-C [facility] 08/31/2017, 11:12 AM

## 2017-08-31 NOTE — Patient Instructions (Signed)
We will recheck your lungs next week

## 2017-09-04 ENCOUNTER — Ambulatory Visit (INDEPENDENT_AMBULATORY_CARE_PROVIDER_SITE_OTHER): Payer: Medicare Other | Admitting: Physician Assistant

## 2017-09-04 VITALS — BP 135/64 | HR 86

## 2017-09-04 DIAGNOSIS — J45901 Unspecified asthma with (acute) exacerbation: Secondary | ICD-10-CM

## 2017-09-04 MED ORDER — HYDROCOD POLST-CPM POLST ER 10-8 MG/5ML PO SUER: 5.0000 mL | Freq: Every evening | ORAL | 0 refills | Status: DC | PRN

## 2017-09-04 MED ORDER — DOXYCYCLINE HYCLATE 100 MG PO TABS: 100.0000 mg | ORAL_TABLET | Freq: Two times a day (BID) | ORAL | 0 refills | Status: DC

## 2017-09-04 MED ORDER — PREDNISONE 10 MG (21) PO TBPK: ORAL_TABLET | ORAL | 0 refills | Status: DC

## 2017-09-04 NOTE — Discharge Instructions (Signed)
[facility] °[facility] ° °POST OPERATIVE INSTRUCTIONS FOR DR. TROXLER AND DR. FOWLER °[facility] ° ° °1. Take your medication as prescribed.  Pain medication should be taken only as needed. ° °2. Keep the dressing clean, dry and intact. ° °3. Keep your foot elevated above the heart level for the first 48 hours. ° °4. Walking to the bathroom and brief periods of walking are acceptable, unless we have instructed you to be non-weight bearing. ° °5. Always wear your post-op shoe when walking.  Always use your crutches if you are to be non-weight bearing. ° °6. Do not take a shower. Baths are permissible as long as the foot is kept out of the water.  ° °7. Every hour you are awake:  °- Bend your knee 15 times. °- Flex foot 15 times °- Massage calf 15 times ° °8. Call [facility] ([phone]) if any of the following problems occur: °- You develop a temperature or fever. °- The bandage becomes saturated with blood. °- Medication does not stop your pain. °- Injury of the foot occurs. °- Any symptoms of infection including redness, odor, or red streaks running from wound. ° ° °General Anesthesia, Adult, Care After °These instructions provide you with information about caring for yourself after your procedure. Your health care provider may also give you more specific instructions. Your treatment has been planned according to current medical practices, but problems sometimes occur. Call your health care provider if you have any problems or questions after your procedure. °What can I expect after the procedure? °After the procedure, it is common to have: °· Vomiting. °· A sore throat. °· Mental slowness. ° °It is common to feel: °· Nauseous. °· Cold or shivery. °· Sleepy. °· Tired. °· Sore or achy, even in parts of your body where you did not have surgery. ° °Follow these instructions at home: °For at least 24 hours after the procedure: °· Do not: °? Participate in  activities where you could fall or become injured. °? Drive. °? Use heavy machinery. °? Drink alcohol. °? Take sleeping pills or medicines that cause drowsiness. °? Make important decisions or sign legal documents. °? Take care of children on your own. °· Rest. °Eating and drinking °· If you vomit, drink water, juice, or soup when you can drink without vomiting. °· Drink enough fluid to keep your urine clear or pale yellow. °· Make sure you have little or no nausea before eating solid foods. °· Follow the diet recommended by your health care provider. °General instructions °· Have a responsible adult stay with you until you are awake and alert. °· Return to your normal activities as told by your health care provider. Ask your health care provider what activities are safe for you. °· Take over-the-counter and prescription medicines only as told by your health care provider. °· If you smoke, do not smoke without supervision. °· Keep all follow-up visits as told by your health care provider. This is important. °Contact a health care provider if: °· You continue to have nausea or vomiting at home, and medicines are not helpful. °· You cannot drink fluids or start eating again. °· You cannot urinate after 8-12 hours. °· You develop a skin rash. °· You have fever. °· You have increasing redness at the site of your procedure. °Get help right away if: °· You have difficulty breathing. °· You have chest pain. °· You have unexpected bleeding. °· You feel that you   are having a life-threatening or urgent problem. °This information is not intended to replace advice given to you by your health care provider. Make sure you discuss any questions you have with your health care provider. °Document Released: 05/30/2000 Document Revised: 07/27/2015 Document Reviewed: 02/05/2015 °Elsevier Interactive Patient Education © 2018 [employer] ° °

## 2017-09-04 NOTE — Patient Instructions (Signed)

## 2017-09-05 ENCOUNTER — Other Ambulatory Visit: Payer: Self-pay

## 2017-09-05 ENCOUNTER — Encounter: Payer: Self-pay | Admitting: *Deleted

## 2017-09-06 NOTE — Progress Notes (Signed)
Subjective:    Patient ID: Andrea Savage, female    DOB: May 15, 1945, 72 y.o.   MRN: [id number]  Andrea Savage is a 72 y.o. female presenting on 09/04/2017 for URI   HPI   Hx of asthma following up for asthma exacerbation and surgical clearance. Patient had been treated with 6 day steroid taper, Z-pack, and Tussionex. Initially felt better over the weekend but not has resurgence of symptoms. Reports continued coughing such that she might pass out. Cough is no longer productive however. She has used a Ventolin nebulizer treatment prior to this appointment and feels shaky. She denies any headache, weakness, blurred vision. Says her hands and feet feel numb and tingly.   Social History   Tobacco Use  . Smoking status: Never Smoker  . Smokeless tobacco: Never Used  Substance Use Topics  . Alcohol use: Yes    Alcohol/week: 0.6 oz    Types: 1 Glasses of wine per week    Comment: on occasion  . Drug use: No    Review of Systems Per HPI unless specifically indicated above     Objective:    BP (!) 160/95   Pulse 91   SpO2 99%   Wt Readings from Last 3 Encounters:  08/31/17 157 lb 9.6 oz (71.5 kg)  08/25/17 155 lb (70.3 kg)  07/15/17 155 lb (70.3 kg)    Physical Exam  Constitutional: She is oriented to person, place, and time. She appears well-developed and well-nourished.  Patient is lying supine on exam table breathing very heavily. Says she feels like she needs a rescue squad to be called. O2 99%, Pulse 92, bP 160's/90's. Does begin to improve symptomatically until she transfers to exam room chair and then says she can't get up. O2100%, BP 135/64, Pulse 86 at this time.  Eyes: Pupils are equal, round, and reactive to light.  Cardiovascular: Normal rate and regular rhythm.  Pulmonary/Chest: No respiratory distress. She has wheezes.  There is wheezing in bilateral lung fields diffusely.   Neurological: She is alert and oriented to person, place, and time. No cranial  nerve deficit. Coordination normal.  Skin: Skin is warm and dry.  Psychiatric: She has a normal mood and affect. Her behavior is normal.   Results for orders placed or performed in visit on 06/03/16  CBC with Differential/Platelet  Result Value Ref Range   WBC 3.8 3.4 - 10.8 x10E3/uL   RBC 4.29 3.77 - 5.28 x10E6/uL   Hemoglobin 13.7 11.1 - 15.9 g/dL   Hematocrit 39.6 34.0 - 46.6 %   MCV 92 79 - 97 fL   MCH 31.9 26.6 - 33.0 pg   MCHC 34.6 31.5 - 35.7 g/dL   RDW 14.3 12.3 - 15.4 %   Platelets 202 150 - 379 x10E3/uL   Neutrophils 46 Not Estab. %   Lymphs 40 Not Estab. %   Monocytes 11 Not Estab. %   Eos 2 Not Estab. %   Basos 1 Not Estab. %   Neutrophils Absolute 1.8 1.4 - 7.0 x10E3/uL   Lymphocytes Absolute 1.5 0.7 - 3.1 x10E3/uL   Monocytes Absolute 0.4 0.1 - 0.9 x10E3/uL   EOS (ABSOLUTE) 0.1 0.0 - 0.4 x10E3/uL   Basophils Absolute 0.0 0.0 - 0.2 x10E3/uL   Immature Granulocytes 0 Not Estab. %   Immature Grans (Abs) 0.0 0.0 - 0.1 x10E3/uL  Comprehensive metabolic panel  Result Value Ref Range   Glucose 82 65 - 99 mg/dL   BUN 25 8 -  27 mg/dL   Creatinine, Ser 0.92 0.57 - 1.00 mg/dL   GFR calc non Af Amer 63 >59 mL/min/1.73   GFR calc Af Amer 73 >59 mL/min/1.73   BUN/Creatinine Ratio 27 12 - 28   Sodium 137 134 - 144 mmol/L   Potassium 4.1 3.5 - 5.2 mmol/L   Chloride 98 96 - 106 mmol/L   CO2 26 18 - 29 mmol/L   Calcium 9.4 8.7 - 10.3 mg/dL   Total Protein 6.5 6.0 - 8.5 g/dL   Albumin 4.2 3.5 - 4.8 g/dL   Globulin, Total 2.3 1.5 - 4.5 g/dL   Albumin/Globulin Ratio 1.8 1.2 - 2.2   Bilirubin Total 0.4 0.0 - 1.2 mg/dL   Alkaline Phosphatase 80 39 - 117 IU/L   AST 25 0 - 40 IU/L   ALT 20 0 - 32 IU/L  Lipid Panel w/o Chol/HDL Ratio  Result Value Ref Range   Cholesterol, Total 212 (H) 100 - 199 mg/dL   Triglycerides 128 0 - 149 mg/dL   HDL 57 >39 mg/dL   VLDL Cholesterol Cal 26 5 - 40 mg/dL   LDL Calculated 129 (H) 0 - 99 mg/dL  Thyroid Panel With TSH  Result Value Ref  Range   TSH 2.740 0.450 - 4.500 uIU/mL   T4, Total 6.7 4.5 - 12.0 ug/dL   T3 Uptake Ratio 27 24 - 39 %   Free Thyroxine Index 1.8 1.2 - 4.9  VITAMIN D 25 Hydroxy (Vit-D Deficiency, Fractures)  Result Value Ref Range   Vit D, 25-Hydroxy 21.2 (L) 30.0 - 100.0 ng/mL  B12 and Folate Panel  Result Value Ref Range   Vitamin B-12 534 232 - 1,245 pg/mL   Folate 19.4 >3.0 ng/mL  Bayer DCA Hb A1c Waived  Result Value Ref Range   HB A1C (BAYER DCA - WAIVED) 5.2 <7.0 %      Assessment & Plan:   1. Moderate asthma with exacerbation, unspecified whether persistent  Patient cycles through periods of breathing very heavily and then not heavily at all. O2 saturation throughout remains 99-100% and there is good air entry in lungs bilaterally despite some wheezing. She goes through several cycles of rapid breathing and reporting that she cannot stand up and needs a rescue squad called. Offered to call rescue squad multiple times, but patient repeatedly declines stating she will likely just be charged hundreds of dollars with no end result. Patient does not have focal neuro deficits to indicate acute neurologic event. I think she very likely has some SOB from her exacerbation and some jitteriness from recent nebulizer treatment that contribute to her state. Treat as below. Follow up in one week or sooner with Dr. [name].  - predniSONE (STERAPRED UNI-PAK 21 TAB) 10 MG (21) TBPK tablet; Take 6 pills on day 1, 5 pills on day 2 and so on.  Dispense: 21 tablet; Refill: 0 - doxycycline (VIBRA-TABS) 100 MG tablet; Take 1 tablet (100 mg total) by mouth 2 (two) times daily.  Dispense: 7 tablet; Refill: 0  2. Mild asthma with exacerbation, unspecified whether persistent  - chlorpheniramine-HYDROcodone (TUSSIONEX PENNKINETIC ER) 10-8 MG/5ML SUER; Take 5 mLs by mouth at bedtime as needed for cough.  Dispense: 140 mL; Refill: 0    Follow up plan: Return in about 1 week (around 09/11/2017) for myself or Dr.  [name].  Carles Collet, PA-C [facility] 09/06/2017, 1:04 PM

## 2017-09-10 ENCOUNTER — Other Ambulatory Visit: Payer: Self-pay

## 2017-09-10 ENCOUNTER — Emergency Department: Payer: Medicare Other

## 2017-09-10 ENCOUNTER — Emergency Department
Admission: EM | Admit: 2017-09-10 | Discharge: 2017-09-10 | Disposition: A | Discharge status: Home / Self Care | Payer: Medicare Other | Attending: Emergency Medicine | Admitting: Emergency Medicine

## 2017-09-10 ENCOUNTER — Encounter: Payer: Self-pay | Admitting: Emergency Medicine

## 2017-09-10 DIAGNOSIS — Z79899 Other long term (current) drug therapy: Secondary | ICD-10-CM | POA: Diagnosis not present

## 2017-09-10 DIAGNOSIS — F419 Anxiety disorder, unspecified: Secondary | ICD-10-CM | POA: Insufficient documentation

## 2017-09-10 DIAGNOSIS — R0602 Shortness of breath: Secondary | ICD-10-CM

## 2017-09-10 LAB — COMPREHENSIVE METABOLIC PANEL
ALT: 19 U/L (ref 0–44)
AST: 30 U/L (ref 15–41)
Albumin: 4 g/dL (ref 3.5–5.0)
Alkaline Phosphatase: 71 U/L (ref 38–126)
Anion gap: 12 (ref 5–15)
BILIRUBIN TOTAL: 0.7 mg/dL (ref 0.3–1.2)
BUN: 11 mg/dL (ref 8–23)
CHLORIDE: 101 mmol/L (ref 98–111)
CO2: 24 mmol/L (ref 22–32)
CREATININE: 0.69 mg/dL (ref 0.44–1.00)
Calcium: 9.4 mg/dL (ref 8.9–10.3)
GFR calc Af Amer: >60 mL/min (ref >60)
GFR calc non Af Amer: >60 mL/min (ref >60)
GLUCOSE: 97 mg/dL (ref 70–99)
POTASSIUM: 3.6 mmol/L (ref 3.5–5.1)
Sodium: 137 mmol/L (ref 135–145)
Total Protein: 7 g/dL (ref 6.5–8.1)

## 2017-09-10 LAB — CBC
HEMATOCRIT: 45.2 % (ref 35.0–47.0)
Hemoglobin: 15.6 g/dL (ref 12.0–16.0)
MCH: 32.6 pg (ref 26.0–34.0)
MCHC: 34.5 g/dL (ref 32.0–36.0)
MCV: 94.4 fL (ref 80.0–100.0)
PLATELETS: 276 10*3/uL (ref 150–440)
RBC: 4.79 MIL/uL (ref 3.80–5.20)
RDW: 13.7 % (ref 11.5–14.5)
WBC: 8.1 10*3/uL (ref 3.6–11.0)

## 2017-09-10 LAB — TROPONIN I: Troponin I: <0.03 ng/mL (ref <0.03)

## 2017-09-10 NOTE — ED Notes (Signed)
Dr Kinner at bedside. 

## 2017-09-10 NOTE — ED Provider Notes (Signed)
[facility] Emergency Department Provider Note   ____________________________________________    I have reviewed the triage vital signs and the nursing notes.   HISTORY  Chief Complaint Shortness of Breath     HPI Andrea Savage is a 72 y.o. female who presents with complaints of shortness of breath.  Patient is somewhat of a poor historian is quite anxious but reports that after using a breathing treatment this morning her breathing became worse and she became scared and called 911.  No reports of fevers or chills.  No reports of chest pain or chest tightness.  No nausea or vomiting.   Past Medical History:  Diagnosis Date  . Asthma   . Depression   . Fibromyalgia   . GERD (gastroesophageal reflux disease)   . H/O scarlet fever    as child  . Heart murmur    s/p scarlet fever as child  . IBS (irritable bowel syndrome)   . Low bone density   . Osteopenia   . Reflux   . Rosacea   . Wears hearing aid in both ears     Patient Active Problem List   Diagnosis Date Noted  . Asthma 03/23/2017  . Fibromyalgia 03/10/2016  . Sensorineural hearing loss (SNHL) of left ear with restricted hearing of right ear 12/29/2015  . Post herpetic neuralgia 11/12/2015  . Depression 06/29/2015  . Osteopenia   . Reflux   . Rosacea     Past Surgical History:  Procedure Laterality Date  . ABDOMINAL HYSTERECTOMY  2009   Total  . BREAST EXCISIONAL BIOPSY Left 1979   NEG  . COLONOSCOPY  [id number]   repeat 10 years  . FOOT SURGERY Right 1990  . HAND SURGERY Right   . LAPAROSCOPIC OOPHERECTOMY    . TONSILLECTOMY      Prior to Admission medications   Medication Sig Start Date End Date Taking? Authorizing Provider  albuterol (PROVENTIL HFA;VENTOLIN HFA) 108 (90 Base) MCG/ACT inhaler Inhale 2 puffs into the lungs every 6 (six) hours as needed for wheezing or shortness of breath. 02/02/16   Volney American, PA-C  beclomethasone (QVAR) 40 MCG/ACT  inhaler Inhale 2 puffs into the lungs 2 (two) times daily.    [provider]  benzonatate (TESSALON) 200 MG capsule Take 1 capsule (200 mg total) by mouth 2 (two) times daily as needed for cough. 03/23/17   Johnson, Megan P, DO  CALCIUM-MAGNESIUM-ZINC PO Take by mouth daily.    [provider]  chlorpheniramine-HYDROcodone (TUSSIONEX PENNKINETIC ER) 10-8 MG/5ML SUER Take 5 mLs by mouth at bedtime as needed for cough. 09/04/17   Trinna Post, PA-C  citalopram (CELEXA) 40 MG tablet Take 1 tablet (20 mg total) by mouth daily. 10/06/16   Johnson, Megan P, DO  Dextromethorphan-guaiFENesin (MUCINEX DM PO) Take by mouth.    [provider]  doxycycline (VIBRA-TABS) 100 MG tablet Take 1 tablet (100 mg total) by mouth 2 (two) times daily. 09/04/17   Trinna Post, PA-C  gabapentin (NEURONTIN) 100 MG capsule Take 300 mg by mouth daily as needed.    [provider]  Ginger, Zingiber officinalis, (GINGER PO) Take by mouth daily.    [provider]  meloxicam (MOBIC) 15 MG tablet Take 15 mg by mouth daily as needed for pain.    [provider]  montelukast (SINGULAIR) 10 MG tablet Take 10 mg by mouth at bedtime.    [provider]  Multiple Vitamins-Minerals (HAIR VITAMINS  PO) Take by mouth.    [provider]  Naproxen Sodium (ALEVE) 220 MG CAPS Take by mouth every evening.     [provider]  omeprazole (PRILOSEC) 20 MG capsule Take 20 mg by mouth daily.    [provider]  predniSONE (STERAPRED UNI-PAK 21 TAB) 10 MG (21) TBPK tablet Take 6 pills on day 1, 5 pills on day 2 and so on. 09/04/17   Trinna Post, PA-C  Probiotic Product (PROBIOTIC PO) Take by mouth daily.    [provider]     Allergies Compazine [prochlorperazine]  Family History  Problem Relation Age of Onset  . Cancer Mother        breast  . Breast cancer Mother   . Cancer Father        pancreatic  . Liver disease Sister   .  Diabetes Sister   . Thyroid disease Sister   . Thyroid disease Brother   . Kidney disease Brother     Social History Social History   Tobacco Use  . Smoking status: Never Smoker  . Smokeless tobacco: Never Used  Substance Use Topics  . Alcohol use: Yes    Alcohol/week: 0.6 oz    Types: 1 Glasses of wine per week    Comment: on occasion  . Drug use: No    Review of Systems  Constitutional: No fever/chills Eyes: No visual changes.  ENT: No throat swelling Cardiovascular: Denies chest pain. Respiratory: As above Gastrointestinal: No nausea, no vomiting.   Genitourinary: Negative for dysuria. Musculoskeletal: Negative for swelling Skin: Negative for rash. Neurological: Negative for headaches    ____________________________________________   PHYSICAL EXAM:  VITAL SIGNS: ED Triage Vitals  Enc Vitals Group     BP 09/10/17 1306 (!) 168/83     Pulse Rate 09/10/17 1306 (!) 105     Resp 09/10/17 1306 18     Temp 09/10/17 1306 98.2 F (36.8 C)     Temp Source 09/10/17 1306 Oral     SpO2 09/10/17 1306 100 %     Weight 09/10/17 1309 71.7 kg (158 lb)     Height 09/10/17 1309 1.638 m (5' 4.5")     Head Circumference --      Peak Flow --      Pain Score 09/10/17 1309 0     Pain Loc --      Pain Edu? --      Excl. in [place]? --     Constitutional: Alert. No acute distress. Pleasant and interactive Eyes: Conjunctivae are normal.   Nose: No congestion/rhinnorhea. Mouth/Throat: Mucous membranes are moist.    Cardiovascular: Mild tachycardia, regular rhythm. Grossly normal heart sounds.  Good peripheral circulation. Respiratory: Normal respiratory effort.  No retractions. Lungs CTAB. Gastrointestinal: Soft and nontender. No distention.    Musculoskeletal: No lower extremity tenderness nor edema.  Warm and well perfused Neurologic:  Normal speech and language. No gross focal neurologic deficits are appreciated.  Skin:  Skin is warm, dry and intact. No rash  noted. Psychiatric: Mood and affect are normal. Speech and behavior are normal.  ____________________________________________   LABS (all labs ordered are listed, but only abnormal results are displayed)  Labs Reviewed  CBC  COMPREHENSIVE METABOLIC PANEL  TROPONIN I   ____________________________________________  EKG  ED ECG REPORT I, Lavonia Drafts, the attending physician, personally viewed and interpreted this ECG.  Date: 09/10/2017  Rhythm: Tachycardia QRS Axis: normal Intervals: normal ST/T Wave abnormalities: normal Narrative Interpretation: no evidence  of acute ischemia  ____________________________________________  RADIOLOGY  X-ray negative for pneumonia ____________________________________________   PROCEDURES  Procedure(s) performed: No  Procedures   Critical Care performed: No ____________________________________________   INITIAL IMPRESSION / ASSESSMENT AND PLAN / ED COURSE  Pertinent labs & imaging results that were available during my care of the patient were reviewed by me and considered in my medical decision making (see chart for details).  Patient presents with reports of shortness of breath seems to be significantly worsened by anxiety.  Overall she is in no distress lungs are clear to auscultation.  We will check labs, including troponin placed in the monitor and obtain chest x-ray   Work-up is overall unremarkable.  Patient is feeling much better and is calm and relaxed  ----------------------------------------- 2:56 PM on 09/10/2017 -----------------------------------------  Discussed findings with patient and her husband, it appears that she has had several episodes over the last year similar to today's often with feeling lightheaded and anxious.  Unclear etiology of these, recommend follow-up with PCP as given that she is feeling much better no benefit to admission at this time       ____________________________________________   FINAL CLINICAL IMPRESSION(S) / ED DIAGNOSES  Final diagnoses:  Shortness of breath        Note:  This document was prepared using Dragon voice recognition software and may include unintentional dictation errors.    Lavonia Drafts, MD 09/10/17 [phone]

## 2017-09-10 NOTE — ED Notes (Signed)
Pt taken to xray at this time.

## 2017-09-10 NOTE — ED Triage Notes (Signed)
Pt comes into the ED via [facility] from home c/o shortness of breath.  Upon EMS arrival patient was hyperventilating in her home.  Patient took one breathing treatment at home with no relief.  Patient states she has tingling in her finger tips at this time.  Patient presents anxious at this time but denies any h/o anxiety.  Patient talking with unlabored respirations or urgent speech.  Patient in NAD at this time.

## 2017-09-11 ENCOUNTER — Encounter: Payer: Self-pay | Admitting: Neurology

## 2017-09-11 ENCOUNTER — Encounter: Payer: Self-pay | Admitting: Family Medicine

## 2017-09-11 ENCOUNTER — Other Ambulatory Visit: Payer: Self-pay

## 2017-09-11 ENCOUNTER — Ambulatory Visit (INDEPENDENT_AMBULATORY_CARE_PROVIDER_SITE_OTHER): Payer: Medicare Other | Admitting: Family Medicine

## 2017-09-11 VITALS — Ht 65.0 in | Wt 154.4 lb

## 2017-09-11 DIAGNOSIS — R42 Dizziness and giddiness: Secondary | ICD-10-CM | POA: Insufficient documentation

## 2017-09-11 DIAGNOSIS — R55 Syncope and collapse: Secondary | ICD-10-CM

## 2017-09-11 DIAGNOSIS — Z8639 Personal history of other endocrine, nutritional and metabolic disease: Secondary | ICD-10-CM

## 2017-09-11 DIAGNOSIS — R404 Transient alteration of awareness: Secondary | ICD-10-CM | POA: Diagnosis not present

## 2017-09-11 DIAGNOSIS — Z13220 Encounter for screening for lipoid disorders: Secondary | ICD-10-CM | POA: Diagnosis not present

## 2017-09-11 DIAGNOSIS — Z01818 Encounter for other preprocedural examination: Secondary | ICD-10-CM

## 2017-09-11 LAB — UA/M W/RFLX CULTURE, ROUTINE
BILIRUBIN UA: NEGATIVE
Glucose, UA: NEGATIVE
KETONES UA: NEGATIVE
LEUKOCYTES UA: NEGATIVE
Nitrite, UA: NEGATIVE
PH UA: 6.5 (ref 5.0–7.5)
PROTEIN UA: NEGATIVE
SPEC GRAV UA: 1.01 (ref 1.005–1.030)
Urobilinogen, Ur: 0.2 mg/dL (ref 0.2–1.0)

## 2017-09-11 LAB — BAYER DCA HB A1C WAIVED: HB A1C (BAYER DCA - WAIVED): 5.2 % (ref <7.0)

## 2017-09-11 LAB — MICROSCOPIC EXAMINATION: Bacteria, UA: NONE SEEN

## 2017-09-11 NOTE — Patient Instructions (Signed)
Wednesday 09/13/17 3:20PM Address: [address], [place], [place] [address]  Phone: [phone]

## 2017-09-11 NOTE — Assessment & Plan Note (Signed)
Of unclear etiology. Unclear if she is fully passing out or if she is even aware when this occurs. Appears to be getting significantly worse over the past week. NOT cleared for surgery at this time without evaluation by cardiology. Await their input. To see her on 09/13/17 at 3:20PM.

## 2017-09-11 NOTE — Progress Notes (Signed)
Ht 5\' 5"  (1.651 m)   Wt 154 lb 6.4 oz (70 kg)   BMI 25.69 kg/m    Subjective:    Patient ID: Andrea Savage, female    DOB: [birth year], 72 y.o.   MRN: [id number]  HPI: Andrea Savage is a 72 y.o. female  Chief Complaint  Patient presents with  . Pre-surgery check up    [place] for surgery  . ER follow up   Here today for clearance for L foot weil osteotomoy of 2nd and 3rd metatarsal and morton neuroma excision scheduled for 09/14/17 with Dr. Elvina Mattes. Was seen 10 days ago for pre-op, but was recovering from an asthma exacerbation and was on z-pack, prednisone, albuterol inhaler. She had some wheezes at that time, so clearance was held. Returned for recheck on 09/04/17 feeling very short of breath and dizzy. She noted at that time that she felt very short of breath and would have coughing fits that would make her feel like she was going to pass out. She continued with wheezing. She was treated with another prednisone taper and doxycycline and told to follow up here today. She did not do well over the weekend and went to the ER yesterday for SOB. Work up at that time was negative and she felt better with no SOB when she relaxed. She notes that since she got out of the ER, she has been feeling really weak. She notes that her cough continues to be in her throat, but other than that, she is feeling better.   ER FOLLOW UP Time since discharge: 1 day Hospital/facility: [facility] Diagnosis: SOB, worse with anxiety Procedures/tests: EKG, CXR, labs including troponin Consultants: None New medications: None Discharge instructions: Follow up here  Status: better   She notes that for about 6 years she has been having issues with feeling like she is going to pass out and has been having issues with loss of attention- doesn't remember things. Has some loss of time. She notes that she feels like she is "coming and going". Went to the ER at that time and had full work up with MRI- nothing found. She  notes that it has been happening every couple of years. She notes that it happened again in 2015 1x, but then seemed to happen 2x in the last months. Has not passed out that she knows. Has not had any issues with syncope, has more of an issue with loss of memory and alteration of consciousness, but notes that her legs become very weak, her vision blurs and goes black. She is not able to stand or walk. She feels very dizzy and unable to get up and walk. She notes that this has happened a few times over the last 6 years, but 2x in the last week. She cannot remember walking into our office last week, she notes that she cannot get up. She gets very short of breath, she gets very confused and cannot remember things. She feels like her legs get very weak and she cannot stand. She is very concerned about getting her surgery done- which is supposed to be done on Thursday this week. She notes that she has had panic attacks before and strongly feels that this is unlike any of her panic attacks she has had in the past. She denies that she has been more anxious recently.  Depression screen [facility] 2/9 09/11/2017 11/30/2016 11/12/2015  Decreased Interest 0 0 0  Down, Depressed, Hopeless 0 0 0  PHQ - 2  Score 0 0 0  Altered sleeping 0 - -  Feeling bad or failure about yourself  0 - -  Trouble concentrating 0 - -  Suicidal thoughts 0 - -  PHQ-9 Score 0 - -  Difficult doing work/chores Somewhat difficult - -    Active Ambulatory Problems    Diagnosis Date Noted  . Osteopenia   . Reflux   . Rosacea   . Depression 06/29/2015  . Post herpetic neuralgia 11/12/2015  . Sensorineural hearing loss (SNHL) of left ear with restricted hearing of right ear 12/29/2015  . Fibromyalgia 03/10/2016  . Asthma 03/23/2017  . Near syncope 09/11/2017  . Alteration of consciousness 09/11/2017   Resolved Ambulatory Problems    Diagnosis Date Noted  . No Resolved Ambulatory Problems   Past Medical History:  Diagnosis Date  . Asthma     . Depression   . Fibromyalgia   . GERD (gastroesophageal reflux disease)   . H/O scarlet fever   . Heart murmur   . IBS (irritable bowel syndrome)   . Low bone density   . Osteopenia   . Reflux   . Rosacea   . Wears hearing aid in both ears    Past Surgical History:  Procedure Laterality Date  . ABDOMINAL HYSTERECTOMY  2009   Total  . BREAST EXCISIONAL BIOPSY Left 1979   NEG  . COLONOSCOPY  [id number]   repeat 10 years  . FOOT SURGERY Right 1990  . HAND SURGERY Right   . LAPAROSCOPIC OOPHERECTOMY    . TONSILLECTOMY     Outpatient Encounter Medications as of 09/11/2017  Medication Sig  . albuterol (PROVENTIL HFA;VENTOLIN HFA) 108 (90 Base) MCG/ACT inhaler Inhale 2 puffs into the lungs every 6 (six) hours as needed for wheezing or shortness of breath.  . beclomethasone (QVAR) 40 MCG/ACT inhaler Inhale 2 puffs into the lungs 2 (two) times daily.  . benzonatate (TESSALON) 200 MG capsule Take 1 capsule (200 mg total) by mouth 2 (two) times daily as needed for cough.  . citalopram (CELEXA) 40 MG tablet Take 1 tablet (20 mg total) by mouth daily.  . meloxicam (MOBIC) 15 MG tablet Take 15 mg by mouth daily as needed for pain.  . montelukast (SINGULAIR) 10 MG tablet Take 10 mg by mouth at bedtime.  Marland Kitchen omeprazole (PRILOSEC) 20 MG capsule Take 20 mg by mouth daily.  Marland Kitchen CALCIUM-MAGNESIUM-ZINC PO Take by mouth daily.  . chlorpheniramine-HYDROcodone (TUSSIONEX PENNKINETIC ER) 10-8 MG/5ML SUER Take 5 mLs by mouth at bedtime as needed for cough. (Patient not taking: Reported on 09/11/2017)  . Dextromethorphan-guaiFENesin ([place] DM PO) Take by mouth.  . doxycycline (VIBRA-TABS) 100 MG tablet Take 1 tablet (100 mg total) by mouth 2 (two) times daily. (Patient not taking: Reported on 09/11/2017)  . gabapentin (NEURONTIN) 100 MG capsule Take 300 mg by mouth daily as needed.  . Ginger, Zingiber officinalis, (GINGER PO) Take by mouth daily.  . Multiple Vitamins-Minerals (HAIR VITAMINS PO) Take by mouth.   . Naproxen Sodium (ALEVE) 220 MG CAPS Take by mouth every evening.   . predniSONE (STERAPRED UNI-PAK 21 TAB) 10 MG (21) TBPK tablet Take 6 pills on day 1, 5 pills on day 2 and so on. (Patient not taking: Reported on 09/11/2017)  . Probiotic Product (PROBIOTIC PO) Take by mouth daily.   No facility-administered encounter medications on file as of 09/11/2017.    Allergies  Allergen Reactions  . Compazine [Prochlorperazine] Other (See Comments)    Grand mal seizure  Family History  Problem Relation Age of Onset  . Cancer Mother        breast  . Breast cancer Mother   . Cancer Father        pancreatic  . Liver disease Sister   . Diabetes Sister   . Thyroid disease Sister   . Thyroid disease Brother   . Kidney disease Brother    Social History   Socioeconomic History  . Marital status: Married    Spouse name: Not on file  . Number of children: Not on file  . Years of education: Not on file  . Highest education level: Not on file  Occupational History  . Not on file  Social Needs  . Financial resource strain: Not on file  . Food insecurity:    Worry: Not on file    Inability: Not on file  . Transportation needs:    Medical: Not on file    Non-medical: Not on file  Tobacco Use  . Smoking status: Never Smoker  . Smokeless tobacco: Never Used  Substance and Sexual Activity  . Alcohol use: Yes    Alcohol/week: 0.6 oz    Types: 1 Glasses of wine per week    Comment: on occasion  . Drug use: No  . Sexual activity: Never  Lifestyle  . Physical activity:    Days per week: Not on file    Minutes per session: Not on file  . Stress: Not on file  Relationships  . Social connections:    Talks on phone: Not on file    Gets together: Not on file    Attends religious service: Not on file    Active member of club or organization: Not on file    Attends meetings of clubs or organizations: Not on file    Relationship status: Not on file  . Intimate partner violence:    Fear of  current or ex partner: Not on file    Emotionally abused: Not on file    Physically abused: Not on file    Forced sexual activity: Not on file  Other Topics Concern  . Not on file  Social History Narrative  . Not on file    Review of Systems  Constitutional: Positive for fatigue. Negative for activity change, appetite change, chills, diaphoresis, fever and unexpected weight change.  HENT: Negative.   Eyes: Negative.   Respiratory: Positive for chest tightness and shortness of breath. Negative for apnea, cough, choking, wheezing and stridor.   Cardiovascular: Negative.   Musculoskeletal: Negative.   Neurological: Positive for dizziness, weakness and light-headedness. Negative for tremors, seizures, syncope, facial asymmetry, speech difficulty, numbness and headaches.  Hematological: Negative.   Psychiatric/Behavioral: Positive for confusion. Negative for agitation, behavioral problems, decreased concentration, dysphoric mood, hallucinations, self-injury, sleep disturbance and suicidal ideas. The patient is not nervous/anxious and is not hyperactive.     Per HPI unless specifically indicated above     Objective:    Ht 5\' 5"  (1.651 m)   Wt 154 lb 6.4 oz (70 kg)   BMI 25.69 kg/m   Wt Readings from Last 3 Encounters:  09/11/17 154 lb 6.4 oz (70 kg)  09/10/17 158 lb (71.7 kg)  08/31/17 157 lb 9.6 oz (71.5 kg)    Physical Exam  Constitutional: She is oriented to person, place, and time. She appears well-developed and well-nourished. No distress.  HENT:  Head: Normocephalic and atraumatic.  Right Ear: Hearing normal.  Left Ear: Hearing normal.  Nose:  Nose normal.  Eyes: Conjunctivae and lids are normal. Right eye exhibits no discharge. Left eye exhibits no discharge. No scleral icterus.  Cardiovascular: Normal rate, regular rhythm, normal heart sounds and intact distal pulses. Exam reveals no gallop and no friction rub.  No murmur heard. Pulmonary/Chest: Effort normal and breath  sounds normal. No stridor. No respiratory distress. She has no wheezes. She has no rales. She exhibits no tenderness.  Musculoskeletal: Normal range of motion.  Neurological: She is alert and oriented to person, place, and time.  Skin: Skin is warm, dry and intact. Capillary refill takes less than 2 seconds. No rash noted. She is not diaphoretic. No erythema. No pallor.  Psychiatric: She has a normal mood and affect. Her speech is normal and behavior is normal. Judgment and thought content normal. Cognition and memory are normal.  Nursing note and vitals reviewed.   Results for orders placed or performed during the hospital encounter of 09/10/17  CBC  Result Value Ref Range   WBC 8.1 3.6 - 11.0 K/uL   RBC 4.79 3.80 - 5.20 MIL/uL   Hemoglobin 15.6 12.0 - 16.0 g/dL   HCT 45.2 35.0 - 47.0 %   MCV 94.4 80.0 - 100.0 fL   MCH 32.6 26.0 - 34.0 pg   MCHC 34.5 32.0 - 36.0 g/dL   RDW 13.7 11.5 - 14.5 %   Platelets 276 150 - 440 K/uL  Comprehensive metabolic panel  Result Value Ref Range   Sodium 137 135 - 145 mmol/L   Potassium 3.6 3.5 - 5.1 mmol/L   Chloride 101 98 - 111 mmol/L   CO2 24 22 - 32 mmol/L   Glucose, Bld 97 70 - 99 mg/dL   BUN 11 8 - 23 mg/dL   Creatinine, Ser 0.69 0.44 - 1.00 mg/dL   Calcium 9.4 8.9 - 10.3 mg/dL   Total Protein 7.0 6.5 - 8.1 g/dL   Albumin 4.0 3.5 - 5.0 g/dL   AST 30 15 - 41 U/L   ALT 19 0 - 44 U/L   Alkaline Phosphatase 71 38 - 126 U/L   Total Bilirubin 0.7 0.3 - 1.2 mg/dL   GFR calc non Af Amer >60 >60 mL/min   GFR calc Af Amer >60 >60 mL/min   Anion gap 12 5 - 15  Troponin I  Result Value Ref Range   Troponin I <0.03 <0.03 ng/mL      Assessment & Plan:   Problem List Items Addressed This Visit      Cardiovascular and Mediastinum   Near syncope - Primary    Of unclear etiology. Unclear if she is fully passing out or if she is even aware when this occurs. Appears to be getting significantly worse over the past week. NOT cleared for surgery at  this time without evaluation by cardiology. Await their input. To see her on 09/13/17 at 3:20PM.       Relevant Orders   Ambulatory referral to Cardiology   [facility] Hb A1c Waived   CBC with Differential/Platelet   Comprehensive metabolic panel   UA/M w/rflx Culture, Routine   Thyroid Panel With TSH     Other   Alteration of consciousness    Of unclear etiology. Unclear if cardiac or neurologic or psychiatric- although patient states that this is unlike any of her panic attacks she's ever had. Last MRI was in 10/17 and was normal. Will check labs. Will get her in to see cardiology and neurology. Call with any concerns. Continue  to monitor.       Relevant Orders   [facility] DCA Hb A1c Waived   CBC with Differential/Platelet   Comprehensive metabolic panel   UA/M w/rflx Culture, Routine   Thyroid Panel With TSH   Ambulatory referral to Neurology    Other Visit Diagnoses    History of vitamin D deficiency       Rechecking levels today. Await results.    Relevant Orders   VITAMIN D 25 Hydroxy (Vit-D Deficiency, Fractures)   Screening for cholesterol level       Rechecking levels today. Await results.    Relevant Orders   Lipid Panel w/o Chol/HDL Ratio   Pre-op evaluation       Given increasing frequency and unclear etiology of near syncope and elective surgery, patient is NOT CLEARED until cleared by cardiology.       Follow up plan: Return Pending Cardiology appointment. Marland Kitchen

## 2017-09-11 NOTE — Assessment & Plan Note (Signed)
Of unclear etiology. Unclear if cardiac or neurologic or psychiatric- although patient states that this is unlike any of her panic attacks she's ever had. Last MRI was in 10/17 and was normal. Will check labs. Will get her in to see cardiology and neurology. Call with any concerns. Continue to monitor.

## 2017-09-12 ENCOUNTER — Other Ambulatory Visit: Payer: Self-pay | Admitting: Family Medicine

## 2017-09-12 LAB — CBC WITH DIFFERENTIAL/PLATELET
BASOS ABS: 0 10*3/uL (ref 0.0–0.2)
Basos: 0 %
EOS (ABSOLUTE): 0.1 10*3/uL (ref 0.0–0.4)
Eos: 2 %
HEMOGLOBIN: 14.6 g/dL (ref 11.1–15.9)
Hematocrit: 44.7 % (ref 34.0–46.6)
IMMATURE GRANULOCYTES: 2 %
Immature Grans (Abs): 0.1 10*3/uL (ref 0.0–0.1)
LYMPHS: 34 %
Lymphocytes Absolute: 2.3 10*3/uL (ref 0.7–3.1)
MCH: 31.8 pg (ref 26.6–33.0)
MCHC: 32.7 g/dL (ref 31.5–35.7)
MCV: 97 fL (ref 79–97)
MONOCYTES: 8 %
Monocytes Absolute: 0.5 10*3/uL (ref 0.1–0.9)
NEUTROS ABS: 3.8 10*3/uL (ref 1.4–7.0)
NEUTROS PCT: 54 %
PLATELETS: 219 10*3/uL (ref 150–450)
RBC: 4.59 x10E6/uL (ref 3.77–5.28)
RDW: 14 % (ref 12.3–15.4)
WBC: 6.8 10*3/uL (ref 3.4–10.8)

## 2017-09-12 LAB — THYROID PANEL WITH TSH
Free Thyroxine Index: 2.1 (ref 1.2–4.9)
T3 Uptake Ratio: 29 % (ref 24–39)
T4 TOTAL: 7.2 ug/dL (ref 4.5–12.0)
TSH: 1.64 u[IU]/mL (ref 0.450–4.500)

## 2017-09-12 LAB — LIPID PANEL W/O CHOL/HDL RATIO
CHOLESTEROL TOTAL: 221 mg/dL — AB (ref 100–199)
HDL: 58 mg/dL (ref >39)
LDL CALC: 115 mg/dL — AB (ref 0–99)
Triglycerides: 242 mg/dL — ABNORMAL HIGH (ref 0–149)
VLDL CHOLESTEROL CAL: 48 mg/dL — AB (ref 5–40)

## 2017-09-12 LAB — COMPREHENSIVE METABOLIC PANEL
ALBUMIN: 3.9 g/dL (ref 3.5–4.8)
ALT: 17 IU/L (ref 0–32)
AST: 17 IU/L (ref 0–40)
Albumin/Globulin Ratio: 1.6 (ref 1.2–2.2)
Alkaline Phosphatase: 73 IU/L (ref 39–117)
BUN / CREAT RATIO: 13 (ref 12–28)
BUN: 10 mg/dL (ref 8–27)
Bilirubin Total: 0.4 mg/dL (ref 0.0–1.2)
CALCIUM: 9.2 mg/dL (ref 8.7–10.3)
CHLORIDE: 97 mmol/L (ref 96–106)
CO2: 25 mmol/L (ref 20–29)
CREATININE: 0.78 mg/dL (ref 0.57–1.00)
GFR calc non Af Amer: 77 mL/min/{1.73_m2} (ref >59)
GFR, EST AFRICAN AMERICAN: 88 mL/min/{1.73_m2} (ref >59)
GLUCOSE: 79 mg/dL (ref 65–99)
Globulin, Total: 2.5 g/dL (ref 1.5–4.5)
Potassium: 4 mmol/L (ref 3.5–5.2)
Sodium: 137 mmol/L (ref 134–144)
TOTAL PROTEIN: 6.4 g/dL (ref 6.0–8.5)

## 2017-09-12 LAB — VITAMIN D 25 HYDROXY (VIT D DEFICIENCY, FRACTURES): Vit D, 25-Hydroxy: 17.6 ng/mL — ABNORMAL LOW (ref 30.0–100.0)

## 2017-09-12 MED ORDER — VITAMIN D (ERGOCALCIFEROL) 1.25 MG (50000 UNIT) PO CAPS: 50000.0000 [IU] | ORAL_CAPSULE | ORAL | 1 refills | Status: DC

## 2017-09-12 NOTE — Progress Notes (Signed)
New Outpatient Visit Date: 09/13/2017  Referring Provider: Valerie Roys, DO [place], [place] [id number]  Chief Complaint: Dizziness and shortness of breath  HPI:  Andrea Savage is a 72 y.o. female who is being seen today for the evaluation of syncope at the request of Dr. [name]. She has a history of recurrent syncope/near-syncope, asthma, fibromyalgia, depression, and post-herpetic neuralgia.  She recently suffered an asthma exacerbation treated with prednisone, azithromycin, and albuterol MDI.  Shortness of breath persisted ultimately leading to an ED visit 3 days ago.  In the ED, it was felt that her symptoms were exacerbating by anxiety.  She also reported intermittent episodes of dizziness with inability to get up and walk over the last several years.  This has also been accompanied by memory gaps.  Episodes have gotten worse recently.  Andrea Savage is scheduled for foot surgery with Dr. Elvina Mattes next week; this was originally scheduled for tomorrow but had to be delayed due to the aforementioned symptoms and need for cardiac clearance.  Andrea Savage reports that her first episode of near syncope occurred around 2013.  She initially noted that her arms and legs went numb.  This was accompanied by darkening of her vision.  Symptoms persisted for over an hour.  She was ultimately able to get home but required assistance climbing up the 2 stairs to get inside.  She was evaluated in the ED and kept for overnight observation at that time.  No clear etiology for her symptoms were identified.  This is happened sporadically over the last 6 years, though she has had several episodes in the last few weeks.  Last week, she got very lightheaded while at her PCPs office and almost passed out.  She had to lie down for about 1.5 hours, after which time she felt back to normal.  There are no clear precipitants other than the lightheadedness often occurs after coughing.  She also notes accompanying  diaphoresis and a warm feeling.  Andrea Savage also notes over a years history of "asthma" though her breathing and cough have not resolved with treatment for possible obstructive lung disease.  Over the last month, this has been particularly bad, with continuing cough despite 2 courses of antibiotics and prednisone.  She has also undergone vocal cord injections for possible laryngospasm.  She also completed speech therapy but has not noticed resolution of her symptoms.  She denies a history of lung disease as a child or young adult.  Andrea Savage denies a history of cardiac disease.  She has undergone remote echocardiogram and stress testing, which are reported as being normal on prior discharge summaries, though the actual test results are not available.  She has not had any palpitations.  She notes occasional vague episodes of achiness in her chest that are not exertional or related to her coughing spells and lightheadedness.  She attributes the discomfort to acid reflux but also states it feels as though she has been laying on her left arm in an awkward manner.  She consumes 6 cups of coffee per day and approximately 4 ounces of alcohol per week.  Andrea Savage is hoping to proceed with foot surgery next week, as she has considerable difficulty walking and is planning a trip to [place] in the early fall.  Due to the aforementioned atypical chest pain, shortness of breath, and lightheadedness/near syncope, we have also been asked to provide cardiac clearance.  --------------------------------------------------------------------------------------------------  Cardiovascular History & Procedures: Cardiovascular Problems:  Shortness  of breath  Syncope/near-syncope  Risk Factors:  Age > 65  Cath/PCI:  None  CV Surgery:  None  EP Procedures and Devices:  None  Non-Invasive Evaluation(s):  Echo and stress test around 2000 02/2012 reportedly normal.  Recent CV Pertinent Labs: Lab  Results  Component Value Date   CHOL 221 (H) 09/11/2017   HDL 58 09/11/2017   LDLCALC 115 (H) 09/11/2017   TRIG 242 (H) 09/11/2017   K 4.0 09/11/2017   K 3.5 09/28/2011   BUN 10 09/11/2017   BUN 14 09/28/2011   CREATININE 0.78 09/11/2017   CREATININE 0.84 09/28/2011    --------------------------------------------------------------------------------------------------  Past Medical History:  Diagnosis Date  . Asthma   . Depression   . Fibromyalgia   . GERD (gastroesophageal reflux disease)   . H/O scarlet fever    as child  . Heart murmur    s/p scarlet fever as child  . IBS (irritable bowel syndrome)   . Low bone density   . Osteopenia   . Reflux   . Rosacea   . Wears hearing aid in both ears     Past Surgical History:  Procedure Laterality Date  . ABDOMINAL HYSTERECTOMY  2009   Total  . BREAST EXCISIONAL BIOPSY Left 1979   NEG  . COLONOSCOPY  [id number]   repeat 10 years  . FOOT SURGERY Right 1990  . HAND SURGERY Right   . LAPAROSCOPIC OOPHERECTOMY    . TONSILLECTOMY      Current Meds  Medication Sig  . albuterol (PROVENTIL HFA;VENTOLIN HFA) 108 (90 Base) MCG/ACT inhaler Inhale 2 puffs into the lungs every 6 (six) hours as needed for wheezing or shortness of breath.  . beclomethasone (QVAR) 40 MCG/ACT inhaler Inhale 2 puffs into the lungs 2 (two) times daily.  . benzonatate (TESSALON) 200 MG capsule Take 1 capsule (200 mg total) by mouth 2 (two) times daily as needed for cough.  . citalopram (CELEXA) 40 MG tablet Take 1 tablet (20 mg total) by mouth daily.  Marland Kitchen Dextromethorphan-guaiFENesin ([place] DM PO) Take by mouth as needed.   . montelukast (SINGULAIR) 10 MG tablet Take 10 mg by mouth at bedtime.  Marland Kitchen omeprazole (PRILOSEC) 20 MG capsule Take 20 mg by mouth daily.    Allergies: Compazine [prochlorperazine]  Social History   Tobacco Use  . Smoking status: Never Smoker  . Smokeless tobacco: Never Used  Substance Use Topics  . Alcohol use: Yes     Alcohol/week: 0.6 oz    Types: 1 Glasses of wine per week    Comment: on occasion  . Drug use: No    Family History  Problem Relation Age of Onset  . Cancer Mother        breast  . Breast cancer Mother   . Cancer Father        pancreatic  . Liver disease Sister   . Diabetes Sister   . Thyroid disease Sister   . Thyroid disease Brother   . Kidney disease Brother     Review of Systems: A 12-system review of systems was performed and was negative except as noted in the HPI.  --------------------------------------------------------------------------------------------------  Physical Exam: BP 130/82 (BP Location: Left Arm, Patient Position: Sitting, Cuff Size: Normal)   Pulse 88   Ht 5' 4.5" (1.638 m)   Wt 157 lb 8 oz (71.4 kg)   BMI 26.62 kg/m   Position Blood pressure (mmHg) Heart rate (bpm)  Lying  137/84  87  Sitting  134/84  89  Standing  156/84  91  Standing (3 minutes)  144/85  93    General: NAD.  Accompanied by husband. HEENT: No conjunctival pallor or scleral icterus. Moist mucous membranes. OP clear. Neck: Supple without lymphadenopathy, thyromegaly, JVD, or HJR. No carotid bruit. Lungs: Normal work of breathing. Clear to auscultation bilaterally without wheezes or crackles. Heart: Regular rate and rhythm without murmurs, rubs, or gallops. Non-displaced PMI. Abd: Bowel sounds present. Soft, NT/ND without hepatosplenomegaly Ext: No lower extremity edema. Radial, PT, and DP pulses are 2+ bilaterally Skin: Warm and dry without rash. Neuro: CNIII-XII intact. Strength and fine-touch sensation intact in upper and lower extremities bilaterally. Psych: Normal mood and affect.  EKG: Normal sinus rhythm without abnormalities.  Lab Results  Component Value Date   WBC 6.8 09/11/2017   HGB 14.6 09/11/2017   HCT 44.7 09/11/2017   MCV 97 09/11/2017   PLT 219 09/11/2017    Lab Results  Component Value Date   NA 137 09/11/2017   K 4.0 09/11/2017   CL 97  09/11/2017   CO2 25 09/11/2017   BUN 10 09/11/2017   CREATININE 0.78 09/11/2017   GLUCOSE 79 09/11/2017   ALT 17 09/11/2017    Lab Results  Component Value Date   CHOL 221 (H) 09/11/2017   HDL 58 09/11/2017   LDLCALC 115 (H) 09/11/2017   TRIG 242 (H) 09/11/2017     --------------------------------------------------------------------------------------------------  ASSESSMENT AND PLAN: Lightheadedness and near syncope Symptoms have been present intermittently for years but seem to have worsened over the last month or two.  There are no consistent precipitants, though lightheadedness is office for an triggered by coughing spells.  Accompanying diaphoresis and warmth suggest a vasovagal mechanism, though prolonged duration (lasting over an hour) is somewhat atypical for this.  Exam and EKG today are normal.  Remote cardiac work-up has also been unrevealing.  We have agreed to obtain an echocardiogram and myocardial perfusion stress test, which will need to be performed with regadenoson as the patient is unable to exercise due to her foot issues.  We will also have the patient wear a 14-day event monitor to evaluate for arrhythmias.  I have advised the patient to stop driving.  I have asked her to minimize her caffeine intake and to stay well-hydrated.  She can also liberalize her sodium intake.  Of note, no evidence of orthostatic hypotension on exam today.  Shortness of breath and atypical chest pain Dyspnea has been present for over a year and has been attributed to asthma and possible vocal cord dysfunction or laryngospasm.  However, symptoms have not resolved with treatments aimed at these 2 pathologies.  Interestingly, pulmonary exam today is unrevealing without significant wheezing.  We have agreed to obtain a transthoracic echocardiogram as well as a pharmacologic myocardial perfusion stress test given persistent dyspnea as well as intermittent atypical chest pain.  We will not make any  medication changes at this time.  Preoperative cardiovascular risk assessment Overall, foot surgery is low risk from a cardiac standpoint.  However, in light of multiple symptoms including shortness of breath, atypical chest pain, and near syncope, I recommend that we at least obtain a transthoracic echocardiogram and pharmacologic myocardial perfusion stress test before proceeding with elective surgery.  If these tests are normal/low risk, I think it is fine for Andrea Savage to proceed with her foot surgery as planned next week.  Ambulatory cardiac event monitoring can be deferred until after her surgery.  Follow-up: Return to clinic  in 6 weeks.  Nelva Bush, MD 09/13/2017 3:45 PM

## 2017-09-13 ENCOUNTER — Ambulatory Visit: Payer: Medicare Other | Admitting: Internal Medicine

## 2017-09-13 ENCOUNTER — Encounter: Payer: Self-pay | Admitting: Internal Medicine

## 2017-09-13 VITALS — BP 130/82 | HR 88 | Ht 64.5 in | Wt 157.5 lb

## 2017-09-13 DIAGNOSIS — R42 Dizziness and giddiness: Secondary | ICD-10-CM

## 2017-09-13 DIAGNOSIS — R0602 Shortness of breath: Secondary | ICD-10-CM

## 2017-09-13 DIAGNOSIS — R55 Syncope and collapse: Secondary | ICD-10-CM | POA: Diagnosis not present

## 2017-09-13 DIAGNOSIS — R0789 Other chest pain: Secondary | ICD-10-CM

## 2017-09-13 NOTE — Patient Instructions (Addendum)
Medication Instructions:  Your physician recommends that you continue on your current medications as directed. Please refer to the Current Medication list given to you today.   Labwork: none  Testing/Procedures: Your physician has requested that you have an echocardiogram. Echocardiography is a painless test that uses sound waves to create images of your heart. It provides your doctor with information about the size and shape of your heart and how well your heart's chambers and valves are working. This procedure takes approximately one hour. There are no restrictions for this procedure. -You may get an IV, if needed, to receive an ultrasound enhancing agent through to better visualize your heart.    Your physician has recommended that you wear an 14 DAY event monitor. Event monitors are medical devices that record the heart's electrical activity. Doctors most often [place] these monitors to diagnose arrhythmias. Arrhythmias are problems with the speed or rhythm of the heartbeat. The monitor is a small, portable device. You can wear one while you do your normal daily activities. This is usually used to diagnose what is causing palpitations/syncope (passing out). SCHEDULE AFTER FOOT SURGERY.  Your physician has requested that you have a lexiscan myoview. For further information please visit [email or web address]. Please follow instruction sheet, as given.  [place]  Your caregiver has ordered a Stress Test with nuclear imaging. The purpose of this test is to evaluate the blood supply to your heart muscle. This procedure is referred to as a "Non-Invasive Stress Test." This is because other than having an IV started in your vein, nothing is inserted or "invades" your body. Cardiac stress tests are done to find areas of poor blood flow to the heart by determining the extent of coronary artery disease (CAD). Some patients exercise on a treadmill, which naturally increases the blood flow to your heart, while  others who are  unable to walk on a treadmill due to physical limitations have a pharmacologic/chemical stress agent called Lexiscan . This medicine will mimic walking on a treadmill by temporarily increasing your coronary blood flow.   Please note: these test may take anywhere between 2-4 hours to complete  PLEASE REPORT TO [facility] AT THE FIRST DESK WILL DIRECT YOU WHERE TO GO  Date of Procedure:_____________________________________  Arrival Time for Procedure:______________________________    PLEASE NOTIFY THE OFFICE AT LEAST 24 HOURS IN ADVANCE IF YOU ARE UNABLE TO KEEP YOUR APPOINTMENT.  [phone] AND  PLEASE NOTIFY NUCLEAR MEDICINE AT [facility] AT LEAST 24 HOURS IN ADVANCE IF YOU ARE UNABLE TO KEEP YOUR APPOINTMENT. [phone]  How to prepare for your Myoview test:  1. Do not eat or drink after midnight 2. No caffeine for 24 hours prior to test 3. No smoking 24 hours prior to test. 4. Your medication may be taken with water.  If your doctor stopped a medication because of this test, do not take that medication. 5. Ladies, please do not wear dresses.  Skirts or pants are appropriate. Please wear a short sleeve shirt. 6. No perfume, cologne or lotion. 7. Wear comfortable walking shoes. No heels!       Follow-Up: Your physician recommends that you schedule a follow-up appointment in: [place].   If you need a refill on your cardiac medications before your next appointment, please call your pharmacy.   Echocardiogram An echocardiogram, or echocardiography, uses sound waves (ultrasound) to produce an image of your heart. The echocardiogram is simple, painless, obtained within a short period  of time, and offers valuable information to your health care provider. The images from an echocardiogram can provide information such as:  Evidence of coronary artery disease (CAD).  Heart size.  Heart muscle function.  Heart valve  function.  Aneurysm detection.  Evidence of a past heart attack.  Fluid buildup around the heart.  Heart muscle thickening.  Assess heart valve function.  Tell a health care provider about:  Any allergies you have.  All medicines you are taking, including vitamins, herbs, eye drops, creams, and over-the-counter medicines.  Any problems you or family members have had with anesthetic medicines.  Any blood disorders you have.  Any surgeries you have had.  Any medical conditions you have.  Whether you are pregnant or may be pregnant. What happens before the procedure? No special preparation is needed. Eat and drink normally. What happens during the procedure?  In order to produce an image of your heart, gel will be applied to your chest and a wand-like tool (transducer) will be moved over your chest. The gel will help transmit the sound waves from the transducer. The sound waves will harmlessly bounce off your heart to allow the heart images to be captured in real-time motion. These images will then be recorded.  You may need an IV to receive a medicine that improves the quality of the pictures. What happens after the procedure? You may return to your normal schedule including diet, activities, and medicines, unless your health care provider tells you otherwise. This information is not intended to replace advice given to you by your health care provider. Make sure you discuss any questions you have with your health care provider. Document Released: 02/19/2000 Document Revised: 10/10/2015 Document Reviewed: 10/29/2012 Elsevier Interactive Patient Education  2017 [employer].    Cardiac Nuclear Scan A cardiac nuclear scan is a test that measures blood flow to the heart when a person is resting and when he or she is exercising. The test looks for problems such as:  Not enough blood reaching a portion of the heart.  The heart muscle not working normally.  You may need this  test if:  You have heart disease.  You have had abnormal lab results.  You have had heart surgery or angioplasty.  You have chest pain.  You have shortness of breath.  In this test, a radioactive dye (tracer) is injected into your bloodstream. After the tracer has traveled to your heart, an imaging device is used to measure how much of the tracer is absorbed by or distributed to various areas of your heart. This procedure is usually done at a hospital and takes 2-4 hours. Tell a health care provider about:  Any allergies you have.  All medicines you are taking, including vitamins, herbs, eye drops, creams, and over-the-counter medicines.  Any problems you or family members have had with the use of anesthetic medicines.  Any blood disorders you have.  Any surgeries you have had.  Any medical conditions you have.  Whether you are pregnant or may be pregnant. What are the risks? Generally, this is a safe procedure. However, problems may occur, including:  Serious chest pain and heart attack. This is only a risk if the stress portion of the test is done.  Rapid heartbeat.  Sensation of warmth in your chest. This usually passes quickly.  What happens before the procedure?  Ask your health care provider about changing or stopping your regular medicines. This is especially important if you are taking diabetes  medicines or blood thinners.  Remove your jewelry on the day of the procedure. What happens during the procedure?  An IV tube will be inserted into one of your veins.  Your health care provider will inject a small amount of radioactive tracer through the tube.  You will wait for 20-40 minutes while the tracer travels through your bloodstream.  Your heart activity will be monitored with an electrocardiogram (ECG).  You will lie down on an exam table.  Images of your heart will be taken for about 15-20 minutes.  You may be asked to exercise on a treadmill or  stationary bike. While you exercise, your heart's activity will be monitored with an ECG, and your blood pressure will be checked. If you are unable to exercise, you may be given a medicine to increase blood flow to parts of your heart.  When blood flow to your heart has peaked, a tracer will again be injected through the IV tube.  After 20-40 minutes, you will get back on the exam table and have more images taken of your heart.  When the procedure is over, your IV tube will be removed. The procedure may vary among health care providers and hospitals. Depending on the type of tracer used, scans may need to be repeated 3-4 hours later. What happens after the procedure?  Unless your health care provider tells you otherwise, you may return to your normal schedule, including diet, activities, and medicines.  Unless your health care provider tells you otherwise, you may increase your fluid intake. This will help flush the contrast dye from your body. Drink enough fluid to keep your urine clear or pale yellow.  It is up to you to get your test results. Ask your health care provider, or the department that is doing the test, when your results will be ready. Summary  A cardiac nuclear scan measures the blood flow to the heart when a person is resting and when he or she is exercising.  You may need this test if you are at risk for heart disease.  Tell your health care provider if you are pregnant.  Unless your health care provider tells you otherwise, increase your fluid intake. This will help flush the contrast dye from your body. Drink enough fluid to keep your urine clear or pale yellow. This information is not intended to replace advice given to you by your health care provider. Make sure you discuss any questions you have with your health care provider. Document Released: 03/18/2004 Document Revised: 02/24/2016 Document Reviewed: 01/30/2013 Elsevier Interactive Patient Education  2017 [employer].

## 2017-09-14 ENCOUNTER — Ambulatory Visit (INDEPENDENT_AMBULATORY_CARE_PROVIDER_SITE_OTHER): Payer: Medicare Other

## 2017-09-14 ENCOUNTER — Telehealth: Payer: Self-pay | Admitting: Internal Medicine

## 2017-09-14 ENCOUNTER — Encounter: Payer: Self-pay | Admitting: Internal Medicine

## 2017-09-14 ENCOUNTER — Other Ambulatory Visit (HOSPITAL_COMMUNITY): Payer: Medicare Other

## 2017-09-14 ENCOUNTER — Other Ambulatory Visit: Payer: Self-pay

## 2017-09-14 DIAGNOSIS — R0602 Shortness of breath: Secondary | ICD-10-CM

## 2017-09-14 DIAGNOSIS — R42 Dizziness and giddiness: Secondary | ICD-10-CM | POA: Diagnosis not present

## 2017-09-14 DIAGNOSIS — R55 Syncope and collapse: Secondary | ICD-10-CM | POA: Diagnosis not present

## 2017-09-14 DIAGNOSIS — R0789 Other chest pain: Secondary | ICD-10-CM | POA: Diagnosis not present

## 2017-09-14 NOTE — Telephone Encounter (Signed)
Spoke with patient and reviewed that the hearing aides should not be a problem but when she is there just let them know at that time. She was very appreciative for the call, verbalized understanding, and had no further questions at this time.

## 2017-09-14 NOTE — Telephone Encounter (Signed)
No answer/Voicemail box is full.  

## 2017-09-14 NOTE — Telephone Encounter (Signed)
While checking out patient and we scheduled a myoview for Monday Patient wanted to know would there be an issue with her having hearing aids.  She has them in both ears  Wanted a call back to make sure  Please advise

## 2017-09-18 ENCOUNTER — Encounter
Admission: RE | Admit: 2017-09-18 | Discharge: 2017-09-18 | Disposition: A | Discharge status: Home / Self Care | Payer: Medicare Other | Source: Ambulatory Visit | Attending: Internal Medicine | Admitting: Internal Medicine

## 2017-09-18 DIAGNOSIS — R42 Dizziness and giddiness: Secondary | ICD-10-CM | POA: Diagnosis not present

## 2017-09-18 DIAGNOSIS — R55 Syncope and collapse: Secondary | ICD-10-CM | POA: Diagnosis not present

## 2017-09-18 DIAGNOSIS — R0789 Other chest pain: Secondary | ICD-10-CM | POA: Diagnosis not present

## 2017-09-18 DIAGNOSIS — R0602 Shortness of breath: Secondary | ICD-10-CM | POA: Diagnosis not present

## 2017-09-18 LAB — NM MYOCAR MULTI W/SPECT W/WALL MOTION / EF
CHL CUP NUCLEAR SDS: 1
CHL CUP NUCLEAR SSS: 5
CSEPPHR: 110 {beats}/min
LV sys vol: 7 mL
LVDIAVOL: 39 mL (ref 46–106)
Percent HR: 73 %
Rest HR: 75 {beats}/min
SRS: 5
TID: 1

## 2017-09-18 MED ORDER — REGADENOSON 0.4 MG/5ML IV SOLN
0.4000 mg | Freq: Once | INTRAVENOUS | Status: AC
  Administered 2017-09-18: 0.4 mg via INTRAVENOUS
  Filled 2017-09-18: qty 5

## 2017-09-18 MED ORDER — TECHNETIUM TC 99M TETROFOSMIN IV KIT
13.8900 | PACK | Freq: Once | INTRAVENOUS | Status: AC | PRN
  Administered 2017-09-18: 13.89 via INTRAVENOUS

## 2017-09-18 MED ORDER — TECHNETIUM TC 99M TETROFOSMIN IV KIT
31.3700 | PACK | Freq: Once | INTRAVENOUS | Status: AC | PRN
  Administered 2017-09-18: 31.37 via INTRAVENOUS

## 2017-09-19 ENCOUNTER — Telehealth: Payer: Self-pay | Admitting: Internal Medicine

## 2017-09-19 NOTE — Telephone Encounter (Signed)
Pt calling stating she had myoview yesterday   She states she would like to know as soon as the results are in can we please send in clearance to Dr Troxler's office  His fax number is [phone]  She is asking if we can please send in the clearance in as soon as we can

## 2017-09-19 NOTE — Telephone Encounter (Signed)
Patient verbalized understanding of results of echo and stress test. See result notes. Results were forwarded to Dr Elvina Mattes by Dr End. Patient was very Patent attorney.

## 2017-09-21 ENCOUNTER — Ambulatory Visit: Payer: Medicare Other | Admitting: Anesthesiology

## 2017-09-21 ENCOUNTER — Ambulatory Visit
Admission: RE | Admit: 2017-09-21 | Discharge: 2017-09-21 | Disposition: A | Discharge status: Home / Self Care | Payer: Medicare Other | Source: Ambulatory Visit | Attending: Podiatry | Admitting: Podiatry

## 2017-09-21 ENCOUNTER — Encounter
Admission: RE | Disposition: A | Discharge status: Home / Self Care | Payer: Self-pay | Source: Ambulatory Visit | Attending: Podiatry

## 2017-09-21 ENCOUNTER — Encounter: Payer: Self-pay | Admitting: Podiatry

## 2017-09-21 DIAGNOSIS — M24575 Contracture, left foot: Secondary | ICD-10-CM | POA: Diagnosis not present

## 2017-09-21 DIAGNOSIS — G5762 Lesion of plantar nerve, left lower limb: Secondary | ICD-10-CM | POA: Diagnosis not present

## 2017-09-21 DIAGNOSIS — Z888 Allergy status to other drugs, medicaments and biological substances status: Secondary | ICD-10-CM | POA: Diagnosis not present

## 2017-09-21 DIAGNOSIS — M1990 Unspecified osteoarthritis, unspecified site: Secondary | ICD-10-CM | POA: Diagnosis not present

## 2017-09-21 DIAGNOSIS — M216X2 Other acquired deformities of left foot: Secondary | ICD-10-CM | POA: Insufficient documentation

## 2017-09-21 DIAGNOSIS — E739 Lactose intolerance, unspecified: Secondary | ICD-10-CM | POA: Diagnosis not present

## 2017-09-21 DIAGNOSIS — M778 Other enthesopathies, not elsewhere classified: Secondary | ICD-10-CM | POA: Diagnosis not present

## 2017-09-21 DIAGNOSIS — J449 Chronic obstructive pulmonary disease, unspecified: Secondary | ICD-10-CM | POA: Insufficient documentation

## 2017-09-21 DIAGNOSIS — F329 Major depressive disorder, single episode, unspecified: Secondary | ICD-10-CM | POA: Diagnosis not present

## 2017-09-21 DIAGNOSIS — K219 Gastro-esophageal reflux disease without esophagitis: Secondary | ICD-10-CM | POA: Diagnosis not present

## 2017-09-21 DIAGNOSIS — R011 Cardiac murmur, unspecified: Secondary | ICD-10-CM | POA: Insufficient documentation

## 2017-09-21 DIAGNOSIS — Z79899 Other long term (current) drug therapy: Secondary | ICD-10-CM | POA: Insufficient documentation

## 2017-09-21 DIAGNOSIS — G8929 Other chronic pain: Secondary | ICD-10-CM | POA: Insufficient documentation

## 2017-09-21 DIAGNOSIS — M797 Fibromyalgia: Secondary | ICD-10-CM | POA: Diagnosis not present

## 2017-09-21 HISTORY — PX: EXCISION MORTON'S NEUROMA: SHX5013

## 2017-09-21 HISTORY — DX: Personal history of other infectious and parasitic diseases: Z86.19

## 2017-09-21 HISTORY — DX: Cardiac murmur, unspecified: R01.1

## 2017-09-21 HISTORY — PX: WEIL OSTEOTOMY: SHX5044

## 2017-09-21 HISTORY — DX: Presence of external hearing-aid: Z97.4

## 2017-09-21 SURGERY — OSTEOTOMY, WEIL
Anesthesia: General | Site: Foot | Laterality: Left | Wound class: Clean

## 2017-09-21 MED ORDER — PROPOFOL 10 MG/ML IV BOLUS
INTRAVENOUS | Status: DC | PRN
  Administered 2017-09-21: 120 mg via INTRAVENOUS

## 2017-09-21 MED ORDER — GLYCOPYRROLATE 0.2 MG/ML IJ SOLN
INTRAMUSCULAR | Status: DC | PRN
  Administered 2017-09-21: 0.1 mg via INTRAVENOUS

## 2017-09-21 MED ORDER — PHENYLEPHRINE HCL 10 MG/ML IJ SOLN
INTRAMUSCULAR | Status: DC | PRN
  Administered 2017-09-21 (×2): 50 ug via INTRAVENOUS

## 2017-09-21 MED ORDER — LACTATED RINGERS IV SOLN
INTRAVENOUS | Status: DC
  Administered 2017-09-21: 08:00:00 via INTRAVENOUS

## 2017-09-21 MED ORDER — CEFAZOLIN SODIUM-DEXTROSE 2-4 GM/100ML-% IV SOLN
2.0000 g | INTRAVENOUS | Status: AC
  Administered 2017-09-21: 2 g via INTRAVENOUS

## 2017-09-21 MED ORDER — DEXAMETHASONE SODIUM PHOSPHATE 4 MG/ML IJ SOLN
INTRAMUSCULAR | Status: DC | PRN
  Administered 2017-09-21: 4 mg via INTRAVENOUS

## 2017-09-21 MED ORDER — LIDOCAINE HCL (CARDIAC) PF 100 MG/5ML IV SOSY
PREFILLED_SYRINGE | INTRAVENOUS | Status: DC | PRN
  Administered 2017-09-21: 50 mg via INTRATRACHEAL

## 2017-09-21 MED ORDER — ONDANSETRON HCL 4 MG/2ML IJ SOLN
INTRAMUSCULAR | Status: DC | PRN
  Administered 2017-09-21: 4 mg via INTRAVENOUS

## 2017-09-21 MED ORDER — FENTANYL CITRATE (PF) 100 MCG/2ML IJ SOLN: 25.0000 ug | INTRAMUSCULAR | Status: DC | PRN

## 2017-09-21 MED ORDER — OXYCODONE HCL 5 MG/5ML PO SOLN: 5.0000 mg | Freq: Once | ORAL | Status: DC | PRN

## 2017-09-21 MED ORDER — OXYCODONE HCL 5 MG PO TABS: 5.0000 mg | ORAL_TABLET | Freq: Once | ORAL | Status: DC | PRN

## 2017-09-21 MED ORDER — LIDOCAINE-EPINEPHRINE 1 %-1:100000 IJ SOLN
INTRAMUSCULAR | Status: DC | PRN
  Administered 2017-09-21: 3 mL

## 2017-09-21 MED ORDER — EPHEDRINE SULFATE 50 MG/ML IJ SOLN
INTRAMUSCULAR | Status: DC | PRN
  Administered 2017-09-21: 10 mg via INTRAVENOUS
  Administered 2017-09-21 (×2): 5 mg via INTRAVENOUS
  Administered 2017-09-21: 10 mg via INTRAVENOUS

## 2017-09-21 MED ORDER — FENTANYL CITRATE (PF) 100 MCG/2ML IJ SOLN
INTRAMUSCULAR | Status: DC | PRN
  Administered 2017-09-21: 25 ug via INTRAVENOUS

## 2017-09-21 MED ORDER — POVIDONE-IODINE 7.5 % EX SOLN: Freq: Once | CUTANEOUS | Status: DC

## 2017-09-21 MED ORDER — HYDROCODONE-ACETAMINOPHEN 5-325 MG PO TABS: 1.0000 | ORAL_TABLET | Freq: Four times a day (QID) | ORAL | 0 refills | Status: DC | PRN

## 2017-09-21 MED ORDER — LIDOCAINE HCL (PF) 1 % IJ SOLN
INTRAMUSCULAR | Status: DC | PRN
  Administered 2017-09-21: 4 mL

## 2017-09-21 MED ORDER — BUPIVACAINE HCL (PF) 0.5 % IJ SOLN
INTRAMUSCULAR | Status: DC | PRN
  Administered 2017-09-21: 4 mL
  Administered 2017-09-21: 7 mL

## 2017-09-21 MED ORDER — MIDAZOLAM HCL 5 MG/5ML IJ SOLN
INTRAMUSCULAR | Status: DC | PRN
  Administered 2017-09-21: 2 mg via INTRAVENOUS

## 2017-09-21 SURGICAL SUPPLY — 46 items
.9mm wire ×8 IMPLANT
BANDAGE ELASTIC 4 VELCRO NS (GAUZE/BANDAGES/DRESSINGS) ×2 IMPLANT
BENZOIN TINCTURE PRP APPL 2/3 (GAUZE/BANDAGES/DRESSINGS) ×2 IMPLANT
BIT DRILL 1.7 LNG CANN (DRILL) ×2 IMPLANT
BLADE OSCILLATING/SAGITTAL (BLADE) ×1
BLADE SW THK.38XMED LNG THN (BLADE) ×1 IMPLANT
BNDG ESMARK 4X12 TAN STRL LF (GAUZE/BANDAGES/DRESSINGS) ×2 IMPLANT
BNDG GAUZE 4.5X4.1 6PLY STRL (MISCELLANEOUS) ×2 IMPLANT
BNDG STRETCH 4X75 STRL LF (GAUZE/BANDAGES/DRESSINGS) ×2 IMPLANT
CANISTER SUCT 1200ML W/VALVE (MISCELLANEOUS) ×2 IMPLANT
CNTRSNK DRL 2 SCR (MISCELLANEOUS) ×1 IMPLANT
COUNTERSINK 2.0 (MISCELLANEOUS) ×1
COVER LIGHT HANDLE UNIVERSAL (MISCELLANEOUS) ×4 IMPLANT
CUFF TOURN SGL QUICK 18 (TOURNIQUET CUFF) IMPLANT
DRAPE FLUOR MINI C-ARM 54X84 (DRAPES) ×2 IMPLANT
DURAPREP 26ML APPLICATOR (WOUND CARE) ×2 IMPLANT
ELECT REM PT RETURN 9FT ADLT (ELECTROSURGICAL) ×2
ELECTRODE REM PT RTRN 9FT ADLT (ELECTROSURGICAL) ×1 IMPLANT
GAUZE PETRO XEROFOAM 1X8 (MISCELLANEOUS) ×2 IMPLANT
GAUZE SPONGE 4X4 12PLY STRL (GAUZE/BANDAGES/DRESSINGS) ×2 IMPLANT
GLOVE BIO SURGEON STRL SZ8 (GLOVE) ×2 IMPLANT
GOWN STRL REUS W/ TWL LRG LVL3 (GOWN DISPOSABLE) ×1 IMPLANT
GOWN STRL REUS W/ TWL XL LVL3 (GOWN DISPOSABLE) ×1 IMPLANT
GOWN STRL REUS W/TWL LRG LVL3 (GOWN DISPOSABLE) ×1
GOWN STRL REUS W/TWL XL LVL3 (GOWN DISPOSABLE) ×1
K-WIRE DBL END TROCAR 6X.045 (WIRE) ×2
KIT TURNOVER KIT A (KITS) ×2 IMPLANT
KWIRE DBL END TROCAR 6X.045 (WIRE) ×1 IMPLANT
NEEDLE HYPO 18GX1.5 BLUNT FILL (NEEDLE) ×2 IMPLANT
NEEDLE HYPO 25GX1X1/2 BEV (NEEDLE) ×2 IMPLANT
NS IRRIG 500ML POUR BTL (IV SOLUTION) ×2 IMPLANT
PACK EXTREMITY ARMC (MISCELLANEOUS) ×2 IMPLANT
PENCIL SMOKE EVACUATOR (MISCELLANEOUS) ×2 IMPLANT
RASP SM TEAR CROSS CUT (RASP) ×2 IMPLANT
SCREW 2X13MM STRL (Screw) ×2 IMPLANT
SCREW HEADLESS 2.0X14MM (Screw) ×2 IMPLANT
SCREW HEADLESS SHRT THRD 2X10 (Screw) ×2 IMPLANT
SCREW HEADLESS SHRT THRD 2X12 (Screw) ×2 IMPLANT
SPLINT CAST 1 STEP 4X30 (MISCELLANEOUS) ×2 IMPLANT
SPLINT FAST PLASTER 5X30 (CAST SUPPLIES) ×1
SPLINT PLASTER CAST FAST 5X30 (CAST SUPPLIES) ×1 IMPLANT
STOCKINETTE STRL 6IN 960660 (GAUZE/BANDAGES/DRESSINGS) ×2 IMPLANT
STRAP BODY AND KNEE 60X3 (MISCELLANEOUS) ×2 IMPLANT
STRIP CLOSURE SKIN 1/4X4 (GAUZE/BANDAGES/DRESSINGS) ×2 IMPLANT
SUT VIC AB 4-0 FS2 27 (SUTURE) ×2 IMPLANT
SYR 10ML LL (SYRINGE) ×2 IMPLANT

## 2017-09-21 NOTE — Anesthesia Preprocedure Evaluation (Addendum)
Anesthesia Evaluation  Patient identified by MRN, date of birth, ID band Patient awake    Reviewed: Allergy & Precautions, NPO status , Patient's Chart, lab work & pertinent test results, reviewed documented beta blocker date and time   Airway Mallampati: II  TM Distance: >3 FB Neck ROM: Full    Dental no notable dental hx.    Pulmonary shortness of breath, asthma ,  Recent asthma/COPD exacerbation a few weeks ago   Pulmonary exam normal breath sounds clear to auscultation       Cardiovascular Normal cardiovascular exam+ Valvular Problems/Murmurs (Mild) MR  Rhythm:Regular Rate:Normal  Worked up by cardiolgoy for DOE and near syncope:  TTE 7/11: Normal with mild MR Stress test 7/15: Normal   Neuro/Psych Depression  Neuromuscular disease (Near syncope)    GI/Hepatic Neg liver ROS, GERD  ,  Endo/Other  negative endocrine ROS  Renal/GU negative Renal ROS     Musculoskeletal  (+) Arthritis , Fibromyalgia -  Abdominal Normal abdominal exam  (+)   Peds  Hematology negative hematology ROS (+)   Anesthesia Other Findings   Reproductive/Obstetrics                            Anesthesia Physical Anesthesia Plan  ASA: II  Anesthesia Plan: General   Post-op Pain Management:    Induction: Intravenous  PONV Risk Score and Plan:   Airway Management Planned: LMA  Additional Equipment: None  Intra-op Plan:   Post-operative Plan:   Informed Consent: I have reviewed the patients History and Physical, chart, labs and discussed the procedure including the risks, benefits and alternatives for the proposed anesthesia with the patient or authorized representative who has indicated his/her understanding and acceptance.     Plan Discussed with: CRNA, Anesthesiologist and Surgeon  Anesthesia Plan Comments:         Anesthesia Quick Evaluation

## 2017-09-21 NOTE — Op Note (Signed)
Operative note   Surgeon: Dr. Albertine Patricia, DPM.    Assistant: None    Preop diagnosis: 1.  Plantar flexed second metatarsal left foot 2.  Plantarflexed third metatarsal left foot 3.  Morton's neuroma left foot    Postop diagnosis: 1.  Plantarflexed second metatarsal left foot with osteochondral defect centrally in the metatarsal head 2.  Plantar flexed third metatarsal with osteochondral defect central metatarsal head left foot 3.  Morton's neuroma third metatarsal base left foot    Procedure:   1.  Osteotomy second metatarsal left foot with 2 screw fixation from the mini monster set.  Also micro-abrasion to the osteochondral defect utilizing the tip of a 0.045 K wire.   2.  Osteotomy third metatarsal left foot with 2 screw fixation and micro-abrasion of the osteochondral defect with a 0.5 K wire tip   3.  Excision of Morton's neuroma left foot third metatarsal space     EBL: Less than 10 cc    Anesthesia:general delivered by the anesthesia team I delivered 10 cc of a mixture of 1% lidocaine plain and 0.5% Marcaine plain at the beginning of the case.  I administered 3 cc of lidocaine with epinephrine into the neuroma area after closing up that incision.  At the end of the case I injected 6 cc of 0.5% Marcaine plain across the base of the entire metatarsal regions.    Hemostasis: Ankle tourniquet 220 mmHg pressure    Specimen: Osteochondral damage defects that were removed from the metatarsal heads    Complications: None    Operative indications: Chronic pain unresponsive to conservative care    Procedure:  Patient was brought into the OR and placed on the operating table in thesupine position. After anesthesia was obtained theleft lower extremity was prepped and draped in usual sterile fashion.  Operative Report: At this time attention was directed to the third metatarsal base on the left foot just lateral to the metatarsal phalangeal joint.  A 3 and half centimeter linear incision  was made deep sharp and blunt t dissection bleeders clamped and bovied as required.  The third and fourth metatarsals within spread and separated with a laminar spreader.  Pressure plantarly pushed the neuroma up into the interspace this was identified and traced to its proximal branching also combining this with release of the intermetatarsal ligament.  The proximal incision was freed as were the distal ranches to the third and fourth toes and these were resected at the distal branches and then traced to the normal portion of the proximal branch and removed.  There was an copiously irrigated.  Second procedure: At this time the soft tissue over the third metatarsal distal shaft and metatarsophalangeal joint was freed and opened up with a wheat Lander.  Incision made through the capsule at this time after the tendon was retracted laterally.  This was capsule was freed medial laterally and the head of the third metatarsal was identified.  The toe was plantarflexed and a fairly significant osteochondral defect with some residual loose cartilage was noted in the central portion of the metatarsal head.  I was able to remove that portion of cartilage because it was free-floating.  I elected this time to use a tip of a 0.5 K wire to abrade and do very shallow drills into the osteochondral bone try to promote bleeding and fibrocartilage formation.  At this point an osteotomy was performed in the third metatarsal the metatarsal was slightly elevated by couple of millimeters and shortened  by 2 mm temporarily fixated with K wires.  There is checked FluoroScan good position and correction were noted good metatarsal parabola was noted.  At this point 2 screws from the 2.2 mm mini monster headless screw set were utilized and placed across the osteotomies.  These seem to escape the osteotomy as well.  K wires removed there is checked FluoroScan good position correction was noted.  At this point the area was copiously irrigated  for both the metatarsal and the neuroma area.  The capsular and periosteal tissue overlying the metatarsal was closed with 4-0 Vicryl in a continuous stitch.  The incision was closed laterally over the neuroma excision with that same running stitch that was carried underneath the tendons used to suture up the deep and superficial fascial layers.  Skin was closed to the incision utilizing 4-0 Vicryl in continuous stitch subcuticularly.  Procedure #3: Attention directed to the second metatarsal phalangeal joint and second metatarsal where a similar procedure was performed as that on the third metatarsal.  No neuroma was removed however.  A similar osteochondral defect was identified and removed from the central portion of the head of the second metatarsal.  There was a loose flap of cartilage as well and had removed.  This area was also micro-abraded with the tip of a 0.045 K wire.  The areas were irrigated and closed in similar fashion as the third metatarsal.  At this time the area was blocked 0.5% Marcaine plain as noted earlier.  A sterile compressive dressing was placed across the wounds consisting of Steri-Strips Xeroform gauze 4 x 4's Kling and Kerlix tourniquet was released prior to complete vascular seen return to all digits of the left foot.  A posterior splint was placed on the left foot leg in the operating room.    Patient tolerated the procedure and anesthesia well.  Was transported from the OR to the PACU with all vital signs stable and vascular status intact. To be discharged per routine protocol.  Will follow up in approximately 1 week in the outpatient clinic.

## 2017-09-21 NOTE — Anesthesia Procedure Notes (Signed)
Procedure Name: LMA Insertion Date/Time: 09/21/2017 9:12 AM Performed by: Mayme Genta, CRNA Pre-anesthesia Checklist: Patient identified, Emergency Drugs available, Suction available, Timeout performed and Patient being monitored Patient Re-evaluated:Patient Re-evaluated prior to induction Oxygen Delivery Method: Circle system utilized Preoxygenation: Pre-oxygenation with 100% oxygen Induction Type: IV induction LMA: LMA inserted LMA Size: 4.0 Number of attempts: 1 Placement Confirmation: positive ETCO2 and breath sounds checked- equal and bilateral Tube secured with: Tape

## 2017-09-21 NOTE — H&P (Signed)
H and P has been reviewed and no changes are noted. The patient had some asthma issues couple weeks ago when she originally  had surgery scheduled that seems to be settled down now.

## 2017-09-21 NOTE — Transfer of Care (Signed)
Immediate Anesthesia Transfer of Care Note  Patient: Andrea Savage  Procedure(s) Performed: Malka So OSTEOTOMY-2ND & 3RD TOES (Left Foot) EXCISION MORTON'S NEUROMA (Left Foot)  Patient Location: PACU  Anesthesia Type: General  Level of Consciousness: awake, alert  and patient cooperative  Airway and Oxygen Therapy: Patient Spontanous Breathing and Patient connected to supplemental oxygen  Post-op Assessment: Post-op Vital signs reviewed, Patient's Cardiovascular Status Stable, Respiratory Function Stable, Patent Airway and No signs of Nausea or vomiting  Post-op Vital Signs: Reviewed and stable  Complications: No apparent anesthesia complications

## 2017-09-21 NOTE — Anesthesia Postprocedure Evaluation (Signed)
Anesthesia Post Note  Patient: Andrea Savage  Procedure(s) Performed: WEIL OSTEOTOMY-2ND & 3RD TOES (Left Foot) EXCISION MORTON'S NEUROMA (Left Foot)  Patient location during evaluation: PACU Anesthesia Type: General Level of consciousness: awake Pain management: pain level controlled Vital Signs Assessment: post-procedure vital signs reviewed and stable Respiratory status: spontaneous breathing Cardiovascular status: blood pressure returned to baseline Postop Assessment: no headache Anesthetic complications: no    Lavonna Monarch

## 2017-09-22 ENCOUNTER — Encounter: Payer: Self-pay | Admitting: Podiatry

## 2017-09-25 LAB — SURGICAL PATHOLOGY

## 2017-10-06 ENCOUNTER — Ambulatory Visit (INDEPENDENT_AMBULATORY_CARE_PROVIDER_SITE_OTHER): Payer: Medicare Other

## 2017-10-06 DIAGNOSIS — R55 Syncope and collapse: Secondary | ICD-10-CM | POA: Diagnosis not present

## 2017-10-06 DIAGNOSIS — R0602 Shortness of breath: Secondary | ICD-10-CM | POA: Diagnosis not present

## 2017-10-06 DIAGNOSIS — R42 Dizziness and giddiness: Secondary | ICD-10-CM

## 2017-10-11 ENCOUNTER — Telehealth: Payer: Self-pay | Admitting: Family Medicine

## 2017-10-11 DIAGNOSIS — J4540 Moderate persistent asthma, uncomplicated: Secondary | ICD-10-CM

## 2017-10-11 NOTE — Telephone Encounter (Signed)
Copied from [place]. Topic: Referral - Request >> Oct 10, 2017 10:55 AM Antonieta Iba C wrote: Reason for CRM: pt is requesting a referral to [facility]  Please assist further.   >> Oct 10, 2017  1:19 PM Don Perking M wrote: Please advise.

## 2017-10-11 NOTE — Telephone Encounter (Signed)
Referral placed.

## 2017-10-16 ENCOUNTER — Other Ambulatory Visit: Payer: Self-pay | Admitting: Family Medicine

## 2017-10-16 ENCOUNTER — Encounter: Payer: Self-pay | Admitting: Neurology

## 2017-10-16 ENCOUNTER — Other Ambulatory Visit: Payer: Self-pay

## 2017-10-16 ENCOUNTER — Ambulatory Visit: Payer: Medicare Other | Admitting: Neurology

## 2017-10-16 VITALS — BP 126/78 | HR 78 | Ht 64.5 in | Wt 154.0 lb

## 2017-10-16 DIAGNOSIS — R55 Syncope and collapse: Secondary | ICD-10-CM | POA: Diagnosis not present

## 2017-10-16 DIAGNOSIS — G450 Vertebro-basilar artery syndrome: Secondary | ICD-10-CM | POA: Diagnosis not present

## 2017-10-16 NOTE — Patient Instructions (Addendum)
1. Schedule MRI brain without contrast 2. Schedule MRA head without contrast and MRA neck with and without contrast  We have sent a referral to [facility] for your MRI / MRA's and they will call you directly to schedule your appt. They are located at [place]. If you need to contact them directly please call [phone].   3. Schedule routine EEG 4. Increase fluid intake and liberalize salt intake 5. If at any point you lose consciousness, would stop driving 6. Follow-up after tests, call for any changes

## 2017-10-16 NOTE — Progress Notes (Signed)
NEUROLOGY CONSULTATION NOTE  Andrea Savage MRN: [id number] DOB: 15-Jul-1945  Referring provider: Dr. Park Liter Primary care provider: Dr. Park Liter  Reason for consult:  Alteration of consciousness  Dear Dr [name]:  Thank you for your kind referral of Andrea Savage for consultation of the above symptoms. Although her history is well known to you, please allow me to reiterate it for the purpose of our medical record. The patient was accompanied to the clinic by her husband who also provides collateral information. Records and images were personally reviewed where available.  HISTORY OF PRESENT ILLNESS: This is a very pleasant 72 year old right-handed woman with a history of asthma, hearing loss, presenting for evaluation of episodes of lightheadedness with bilateral leg weakness. She has had a total of 4 episodes since 2013. The first episode occurred while she was at a meeting, she was sitting then started feeling tingling, numb, and cold in both legs, then her fingers started tingling. She felt nauseated and woozy but was able to drive home. She lay down and kept saying she was going to pass out. She recalls being cold but sweating profusely. She states she felt very clearheaded about the events as EMS arrived, but then had no recollection of being in the ambulance until she was in the stretcher at [facility]. She was soaked with sweat and was told her BP was very low it was not registering with the cuff. She was normal on arrival to the ER. Bloodwork was unremarkable, head CT no acute changes, carotid doppler no significant stenosis, echo normal EF, EKG showed normal sinus rhythm, PVC, incomplete RBBB. She had another similar episode in 2015. She had two episodes in July 2019, she was sick at that time with a viral URI and was having significant coughing. She felt very short of breath and felt like she was going to pass out. She was treated with prednisone and antibiotics. On 09/11/17,  she was at home sitting down and felt like she was "going to go." They decided to go to her PCP office and as they were checking in, she told her husband to do check in because she felt like "I would go," then staff came and had to almost carry/drag her into the exam room. She did not recall the time period of walking from the waiting room to an exam room. She was evaluated by Cardiology with normal echo and stress test. She is wearing a holter currently. She had another episode the other day (with holter on), she was holding on to the fridge when she felt like she would pass out and went down. She did not lost consciousness but could not get herself up for 30 seconds. She squatted down and her husband got her on a chair. She was conversant the entire time. She reports that she had been having sever coughing spells but was not coughing that time.   She rarely has headaches. She denies any diplopia, dysarthria/dysphagia, neck/back pain, focal numbness/tingling/weakness, bowel/bladder dysfunction. No tongue bite or incontinence with these episodes. She reports waking up in 11/2015 with vertigo and severe left-sided hearing loss, had an MRI brain with IACs which was normal. She denies any olfactory/gustatory hallucinations, staring/unresponsive episodes, myoclonic jerks. Her daughter also has recurrent syncope. She had a normal birth and early development, no history of febrile convulsions, CNS infections, significant head injuries, or family history of seizures.   PAST MEDICAL HISTORY: Past Medical History:  Diagnosis Date  . Asthma   .  Depression   . Fibromyalgia   . GERD (gastroesophageal reflux disease)   . H/O scarlet fever    as child  . Heart murmur    s/p scarlet fever as child  . IBS (irritable bowel syndrome)   . Low bone density   . Osteopenia   . Reflux   . Rosacea   . Wears hearing aid in both ears     PAST SURGICAL HISTORY: Past Surgical History:  Procedure Laterality Date  .  ABDOMINAL HYSTERECTOMY  2009   Total  . BREAST EXCISIONAL BIOPSY Left 1979   NEG  . COLONOSCOPY  [id number]   repeat 10 years  . EXCISION MORTON'S NEUROMA Left 09/21/2017   Procedure: EXCISION MORTON'S NEUROMA;  Surgeon: Albertine Patricia, DPM;  Location: [facility];  Service: Podiatry;  Laterality: Left;  . FOOT SURGERY Right 1990  . HAND SURGERY Right   . LAPAROSCOPIC OOPHERECTOMY    . TONSILLECTOMY    . WEIL OSTEOTOMY Left 09/21/2017   Procedure: WEIL OSTEOTOMY-2ND & 3RD TOES;  Surgeon: Albertine Patricia, DPM;  Location: [facility];  Service: Podiatry;  Laterality: Left;  LMA WITH LOCAL MINI MONSTER DR TROXLER WANTS TED HOSE ON PATIENT'S RIGHT LEG    MEDICATIONS: Current Outpatient Medications on File Prior to Visit  Medication Sig Dispense Refill  . albuterol (PROVENTIL HFA;VENTOLIN HFA) 108 (90 Base) MCG/ACT inhaler Inhale 2 puffs into the lungs every 6 (six) hours as needed for wheezing or shortness of breath. 1 Inhaler 6  . beclomethasone (QVAR) 40 MCG/ACT inhaler Inhale 2 puffs into the lungs 2 (two) times daily.    . benzonatate (TESSALON) 200 MG capsule Take 1 capsule (200 mg total) by mouth 2 (two) times daily as needed for cough. 20 capsule 0  . CALCIUM-MAGNESIUM-ZINC PO Take by mouth daily.    . citalopram (CELEXA) 40 MG tablet Take 1 tablet (20 mg total) by mouth daily. 90 tablet 1  . Dextromethorphan-guaiFENesin ([place] DM PO) Take by mouth as needed.     Marland Kitchen HYDROcodone-acetaminophen (NORCO) 5-325 MG tablet Take 1 tablet by mouth every 6 (six) hours as needed for moderate pain. 30 tablet 0  . montelukast (SINGULAIR) 10 MG tablet Take 10 mg by mouth at bedtime.    . Multiple Vitamins-Minerals (HAIR VITAMINS PO) Take by mouth.    Marland Kitchen omeprazole (PRILOSEC) 20 MG capsule Take 20 mg by mouth daily.    . Probiotic Product (PROBIOTIC PO) Take by mouth daily.    . Vitamin D, Ergocalciferol, (DRISDOL) 50000 units CAPS capsule Take 1 capsule (50,000 Units total) by mouth  every 7 (seven) days. (Patient not taking: Reported on 09/13/2017) 12 capsule 1   No current facility-administered medications on file prior to visit.     ALLERGIES: Allergies  Allergen Reactions  . Compazine [Prochlorperazine] Other (See Comments)    Grand mal seizure    FAMILY HISTORY: Family History  Problem Relation Age of Onset  . Cancer Mother        breast  . Breast cancer Mother   . Cancer Father        pancreatic  . Asthma Father   . Heart disease Father   . Liver disease Sister   . Diabetes Sister   . Thyroid disease Sister   . Thyroid disease Brother   . Kidney disease Brother     SOCIAL HISTORY: Social History   Socioeconomic History  . Marital status: Married    Spouse name: Not on file  . Number  of children: Not on file  . Years of education: Not on file  . Highest education level: Not on file  Occupational History  . Not on file  Social Needs  . Financial resource strain: Not on file  . Food insecurity:    Worry: Not on file    Inability: Not on file  . Transportation needs:    Medical: Not on file    Non-medical: Not on file  Tobacco Use  . Smoking status: Never Smoker  . Smokeless tobacco: Never Used  Substance and Sexual Activity  . Alcohol use: Yes    Alcohol/week: 1.0 standard drinks    Types: 1 Glasses of wine per week    Comment: on occasion  . Drug use: No  . Sexual activity: Never  Lifestyle  . Physical activity:    Days per week: Not on file    Minutes per session: Not on file  . Stress: Not on file  Relationships  . Social connections:    Talks on phone: Not on file    Gets together: Not on file    Attends religious service: Not on file    Active member of club or organization: Not on file    Attends meetings of clubs or organizations: Not on file    Relationship status: Not on file  . Intimate partner violence:    Fear of current or ex partner: Not on file    Emotionally abused: Not on file    Physically abused: Not  on file    Forced sexual activity: Not on file  Other Topics Concern  . Not on file  Social History Narrative  . Not on file    REVIEW OF SYSTEMS: Constitutional: No fevers, chills, or sweats, no generalized fatigue, change in appetite Eyes: No visual changes, double vision, eye pain Ear, nose and throat: No hearing loss, ear pain, nasal congestion, sore throat Cardiovascular: No chest pain, palpitations Respiratory:  No shortness of breath at rest or with exertion, wheezes GastrointestinaI: No nausea, vomiting, diarrhea, abdominal pain, fecal incontinence Genitourinary:  No dysuria, urinary retention or frequency Musculoskeletal:  No neck pain, back pain Integumentary: No rash, pruritus, skin lesions Neurological: as above Psychiatric: No depression, insomnia, anxiety Endocrine: No palpitations, fatigue, diaphoresis, mood swings, change in appetite, change in weight, increased thirst Hematologic/Lymphatic:  No anemia, purpura, petechiae. Allergic/Immunologic: no itchy/runny eyes, nasal congestion, recent allergic reactions, rashes  PHYSICAL EXAM: Vitals:   10/16/17 0915  BP: 126/78  Pulse: 78  SpO2: 98%   General: No acute distress Head:  Normocephalic/atraumatic Eyes: Fundoscopic exam shows bilateral sharp discs, no vessel changes, exudates, or hemorrhages Neck: supple, no paraspinal tenderness, full range of motion Back: No paraspinal tenderness Heart: regular rate and rhythm Lungs: Clear to auscultation bilaterally. Vascular: No carotid bruits. Skin/Extremities: No rash, no edema Neurological Exam: Mental status: alert and oriented to person, place, and time, no dysarthria or aphasia, Fund of knowledge is appropriate.  Recent and remote memory are intact.  Attention and concentration are normal.    Able to name objects and repeat phrases. Cranial nerves: CN I: not tested CN II: pupils equal, round and reactive to light, visual fields intact, fundi unremarkable. CN  III, IV, VI:  full range of motion, no nystagmus, no ptosis CN V: facial sensation intact CN VII: upper and lower face symmetric CN VIII: hearing intact to finger rub CN IX, X: gag intact, uvula midline CN XI: sternocleidomastoid and trapezius muscles intact CN XII: tongue midline  Bulk & Tone: normal, no fasciculations. Motor: 5/5 throughout with no pronator drift. Sensation: intact to light touch, cold, pin, vibration and joint position sense.  No extinction to double simultaneous stimulation.  Romberg test negative Deep Tendon Reflexes: +2 throughout, no ankle clonus Plantar responses: downgoing bilaterally Cerebellar: no incoordination on finger to nose, heel to shin. No dysdiadochokinesia Gait: narrow-based and steady, able to tandem walk adequately. Tremor: none  IMPRESSION: This is a very pleasant 72 year old right-handed woman with a history of asthma, hearing loss, presenting for recurrent episodes of near syncope where she would be unable to keep standing and would fall to the ground, with no clear loss of consciousness, but has had some brief loss of time with some of them. Etiology of symptoms unclear, she has seen Cardiology and has a heart monitor that captured the most recent episode. Some of the episodes occurred while coughing suggestive of cough syncope, however most recent episode was not associated with cough. From a neurological standpoint, drop attacks from vertebrobasilar insufficiency could potentially cause similar symptoms, MRI brain without contrast and MRA head and neck will be ordered to assess for underlying structural abnormality. Seizures are less likely, a routine EEG will be done for completion. She was advised to increase fluid intake and liberalize salt intake. We discussed no further driving if she loses consciousness/awareness. Follow-up after tests, she knows to call for any changes.   Thank you for allowing me to participate in the care of this patient.  Please do not hesitate to call for any questions or concerns.   Ellouise Newer, M.D.  CC: Dr. [name]

## 2017-10-19 ENCOUNTER — Ambulatory Visit (INDEPENDENT_AMBULATORY_CARE_PROVIDER_SITE_OTHER): Payer: Medicare Other | Admitting: Neurology

## 2017-10-19 DIAGNOSIS — G450 Vertebro-basilar artery syndrome: Secondary | ICD-10-CM

## 2017-10-19 DIAGNOSIS — R55 Syncope and collapse: Secondary | ICD-10-CM | POA: Diagnosis not present

## 2017-10-26 NOTE — Procedures (Signed)
ELECTROENCEPHALOGRAM REPORT  Date of Study: 10/19/2017  Patient's Name: Andrea Savage MRN: [id number] Date of Birth: 22-May-1945  Referring Provider: Dr. Ellouise Newer  Clinical History: This is a 72 year old woman with recurrent episodes of near syncope where she would be unable to keep standing and would fall to the ground, with no clear loss of consciousness, but has had some brief loss of time with some of them. EEG for classification.  Medications: PROVENTIL HFA;VENTOLIN HFA 108 (90 Base) MCG/ACT inhaler  QVAR 40 MCG/ACT inhaler  TESSALON 200 MG capsule   CALCIUM-MAGNESIUM-ZINC PO   CELEXA 40 MG tablet  MUCINEX DM PO  NORCO 5-325 MG tablet  SINGULAIR 10 MG tablet   HAIR VITAMINS PO  PRILOSEC 20 MG capsule  PROBIOTIC PO  DRISDOL 99242 units CAPS capsule   Technical Summary: A multichannel digital EEG recording measured by the international 10-20 system with electrodes applied with paste and impedances below 5000 ohms performed in our laboratory with EKG monitoring in an awake and asleep patient.  Hyperventilation was not performed. Photic stimulation was performed.  The digital EEG was referentially recorded, reformatted, and digitally filtered in a variety of bipolar and referential montages for optimal display.    Description: The patient is awake and asleep during the recording.  During maximal wakefulness, there is a symmetric, medium voltage 8.5-9 Hz posterior dominant rhythm that attenuates with eye opening.  The record is symmetric.  During drowsiness and sleep, there is an increase in theta slowing of the background.  Vertex waves and symmetric sleep spindles were seen.  Photic stimulation did not elicit any abnormalities.  There were no epileptiform discharges or electrographic seizures seen.    EKG lead was unremarkable.  Impression: This awake and asleep EEG is normal.    Clinical Correlation: A normal EEG does not exclude a clinical diagnosis of epilepsy.  If  further clinical questions remain, prolonged EEG may be helpful.  Clinical correlation is advised.   Ellouise Newer, M.D.

## 2017-11-02 ENCOUNTER — Ambulatory Visit: Payer: Medicare Other | Admitting: Physician Assistant

## 2017-11-02 ENCOUNTER — Encounter: Payer: Self-pay | Admitting: Physician Assistant

## 2017-11-02 VITALS — BP 140/70 | HR 72 | Ht 65.0 in | Wt 155.0 lb

## 2017-11-02 DIAGNOSIS — I471 Supraventricular tachycardia: Secondary | ICD-10-CM

## 2017-11-02 DIAGNOSIS — R0602 Shortness of breath: Secondary | ICD-10-CM

## 2017-11-02 DIAGNOSIS — R55 Syncope and collapse: Secondary | ICD-10-CM | POA: Diagnosis not present

## 2017-11-02 DIAGNOSIS — R42 Dizziness and giddiness: Secondary | ICD-10-CM

## 2017-11-02 DIAGNOSIS — R0789 Other chest pain: Secondary | ICD-10-CM

## 2017-11-02 MED ORDER — METOPROLOL SUCCINATE ER 25 MG PO TB24: 12.5000 mg | ORAL_TABLET | Freq: Every day | ORAL | 3 refills | Status: DC

## 2017-11-02 NOTE — Progress Notes (Signed)
Cardiology Office Note Date:  11/02/2017  Patient ID:  Andrea Savage, Andrea Savage [birth year], MRN [id number] PCP:  Valerie Roys, DO  Cardiologist:  Dr. Saunders Revel, MD    Chief Complaint: Follow up  History of Present Illness: Andrea Savage is a 72 y.o. female with history of recurrent syncope/near-syncope, paroxysmal SVT, possible asthma, fibromyalgia, depression, and post-herpetic neuralgia who presents for follow up of her Myoview, echo and Zio.  Patient reports her first episode of near syncope occurred around 2013.  At that time, she noted her arms and legs went numb with associated darkening of her vision.  The symptoms lasted for over an hour.  She was able to drive home.  She was profoundly weak and required assistance climbing the 2 stairs of her house to get inside.  She was evaluated in the ED and kept for overnight observation at that time with no clear etiology of symptoms being identified.  This has happened sporadically over the past 6 years.  Most recent MRI of the brain done in 12/2015 for hearing loss was unrevealing.    She has noted several episodes of near syncope in the summer 2019.  There have been no clear precipitants other than a lightheadedness that occurs after coughing.  She has a history of asthma and has recently been treated for asthma exacerbation in early 09/2017 with troponin being negative.  Patient had a lightheaded episode at her PCPs office the following day on 7/8 that required 1.5 hours of laying down prior to improvement in symptoms.  She was referred to cardiology and initially evaluated on 09/13/2017.  She denies any previously known history of cardiac disease.  She reported having previously undergone remote echocardiogram and stress testing with results being normal (these are not available for review).  She noted her cough had been particularly worse over the past several months despite 2 courses of antibiotics and prednisone.  She has also undergone vocal  cord injections for possible laryngeal spasm.  She has completed speech therapy but has not noticed any improvement in her symptoms.  Lastly, she noted an occasional vague achiness in her chest that was not exertional or related to her coughing episodes.  She had attributed this to acid reflux.  She had a planned foot surgery for mid July, 2019 and needed cardiac evaluation.  She underwent echocardiogram on 09/14/2017 that showed an EF of 60 to 65%, normal wall motion, grade 1 diastolic dysfunction, mild mitral regurgitation, normal size left atrium, RV systolic function normal, PASP 34 mmHg.  Nuclear stress testing on 09/18/2017 was low risk with an EF greater than 65%.  Outpatient cardiac monitoring for 12 days and 19 hours demonstrated a predominant rhythm of sinus with an average heart rate of 76 bpm with a range of 53 to 135 bpm in sinus and a maximum rate of 203 bpm overall.  There were rare PACs and PVCs.  There were 33 atrial runs with a maximal rate of 203 bpm and the longest lasting 17.3 seconds.  There are no sustained arrhythmias or prolonged pauses.  Patient triggered events corresponded to sinus rhythm, PACs, and atrial runs.  Patient ultimately underwent successful foot surgery on 09/21/2017.  She was evaluated by neurology on 10/16/2017 for her recurrent near syncope/syncope.  They have ordered an EEG, MRI brain, as well as an MRA of the head and neck.  EEG was performed on 10/19/2017 that was normal.  MRA/MRI pending at this time.  Labs: 09/2017 - thyroid function  normal, cmet and cbc unremarkable, LDL 115  She comes in accompanied by her husband today.  She is doing relatively well from a cardiac perspective.  She has not had any further episodes of near syncope/syncope since her episode on 09/11/2017.  She does report having one episode of near syncope while wearing her heart monitor at approximately 3 PM.  She does not recall which day this was.  Of note, there are 2 patient triggered events on 2  separate days (8/5 and 8/7) in which the patient was noted to be in SVT at that time.  It is possible this episode of near syncope occurred while the patient was in SVT though we are unable to definitively determine that with data available.  She does not have any chest pain or shortness of breath.  She continues to have a nonproductive cough.  When she gets into coughing episodes she will feel lightheaded for a few seconds afterwards.  She does not have any lower extremity swelling, abdominal distention, or early satiety.  Of note, the patient reports many years of tachypalpitations that have been intermittent.  Past Medical History:  Diagnosis Date  . Asthma   . Depression   . Fibromyalgia   . GERD (gastroesophageal reflux disease)   . H/O scarlet fever    as child  . Heart murmur    s/p scarlet fever as child  . IBS (irritable bowel syndrome)   . Low bone density   . Osteopenia   . Reflux   . Rosacea   . Wears hearing aid in both ears     Past Surgical History:  Procedure Laterality Date  . ABDOMINAL HYSTERECTOMY  2009   Total  . BREAST EXCISIONAL BIOPSY Left 1979   NEG  . COLONOSCOPY  [id number]   repeat 10 years  . EXCISION MORTON'S NEUROMA Left 09/21/2017   Procedure: EXCISION MORTON'S NEUROMA;  Surgeon: Albertine Patricia, DPM;  Location: [facility];  Service: Podiatry;  Laterality: Left;  . FOOT SURGERY Right 1990  . HAND SURGERY Right   . LAPAROSCOPIC OOPHERECTOMY    . TONSILLECTOMY    . WEIL OSTEOTOMY Left 09/21/2017   Procedure: WEIL OSTEOTOMY-2ND & 3RD TOES;  Surgeon: Albertine Patricia, DPM;  Location: [facility];  Service: Podiatry;  Laterality: Left;  LMA WITH LOCAL MINI MONSTER DR TROXLER WANTS TED HOSE ON PATIENT'S RIGHT LEG    Current Meds  Medication Sig  . albuterol (PROVENTIL HFA;VENTOLIN HFA) 108 (90 Base) MCG/ACT inhaler Inhale 2 puffs into the lungs every 6 (six) hours as needed for wheezing or shortness of breath.  . beclomethasone (QVAR) 40  MCG/ACT inhaler Inhale 2 puffs into the lungs 2 (two) times daily.  . benzonatate (TESSALON) 200 MG capsule Take 1 capsule (200 mg total) by mouth 2 (two) times daily as needed for cough.  Marland Kitchen CALCIUM-MAGNESIUM-ZINC PO Take by mouth daily.  . citalopram (CELEXA) 40 MG tablet Take 1 tablet (20 mg total) by mouth daily.  Marland Kitchen DEXILANT 60 MG capsule   . Dextromethorphan-guaiFENesin ([place] DM PO) Take by mouth as needed.   . montelukast (SINGULAIR) 10 MG tablet Take 10 mg by mouth at bedtime.  . Multiple Vitamins-Minerals (HAIR VITAMINS PO) Take by mouth.  Marland Kitchen omeprazole (PRILOSEC) 20 MG capsule Take 20 mg by mouth daily.  . Probiotic Product (PROBIOTIC PO) Take by mouth daily.  . ranitidine (ZANTAC) 300 MG tablet   . Vitamin D, Ergocalciferol, (DRISDOL) 50000 units CAPS capsule Take 1 capsule (50,000 Units  total) by mouth every 7 (seven) days.    Allergies:   Compazine [prochlorperazine]   Social History:  The patient  reports that she has never smoked. She has never used smokeless tobacco. She reports that she drinks about 1.0 standard drinks of alcohol per week. She reports that she does not use drugs.   Family History:  The patient's family history includes Asthma in her father; Breast cancer in her mother; Cancer in her father and mother; Diabetes in her sister; Heart disease in her father; Kidney disease in her brother; Liver disease in her sister; Thyroid disease in her brother and sister.  ROS:   Review of Systems  Constitutional: Positive for malaise/fatigue. Negative for chills, diaphoresis, fever and weight loss.  HENT: Negative for congestion.   Eyes: Negative for discharge and redness.  Respiratory: Negative for cough, hemoptysis, sputum production, shortness of breath and wheezing.   Cardiovascular: Positive for palpitations. Negative for chest pain, orthopnea, claudication, leg swelling and PND.  Gastrointestinal: Negative for abdominal pain, blood in stool, heartburn, melena, nausea  and vomiting.  Genitourinary: Negative for hematuria.  Musculoskeletal: Negative for falls and myalgias.  Skin: Negative for rash.  Neurological: Positive for dizziness and weakness. Negative for tingling, tremors, sensory change, speech change, focal weakness, seizures, loss of consciousness and headaches.  Endo/Heme/Allergies: Does not bruise/bleed easily.  Psychiatric/Behavioral: Negative for substance abuse. The patient is not nervous/anxious.   All other systems reviewed and are negative.    PHYSICAL EXAM:  VS:  BP 140/70 (BP Location: Left Arm, Patient Position: Sitting, Cuff Size: Normal)   Pulse 72   Ht 5\' 5"  (1.651 m)   Wt 155 lb (70.3 kg)   BMI 25.79 kg/m  BMI: Body mass index is 25.79 kg/m.  Physical Exam  Constitutional: She is oriented to person, place, and time. She appears well-developed and well-nourished.  HENT:  Head: Normocephalic and atraumatic.  Eyes: Right eye exhibits no discharge. Left eye exhibits no discharge.  Neck: Normal range of motion. No JVD present.  Cardiovascular: Normal rate, regular rhythm, S1 normal, S2 normal and normal heart sounds. Exam reveals no distant heart sounds, no friction rub, no midsystolic click and no opening snap.  No murmur heard. Pulses:      Posterior tibial pulses are 2+ on the right side, and 2+ on the left side.  Of note, after listening to the patient's posterior chest she developed a brief coughing episode.  This was followed by a 2 to 3-second episode of lightheadedness that spontaneously resolved.  Patient's heart rate at that time was regular rate and rhythm via radial pulse.  Pulmonary/Chest: Effort normal and breath sounds normal. No respiratory distress. She has no decreased breath sounds. She has no wheezes. She has no rales. She exhibits no tenderness.  Abdominal: Soft. She exhibits no distension. There is no tenderness.  Musculoskeletal: She exhibits no edema.  Neurological: She is alert and oriented to person,  place, and time.  Skin: Skin is warm and dry. No cyanosis. Nails show no clubbing.  Psychiatric: She has a normal mood and affect. Her speech is normal and behavior is normal. Judgment and thought content normal.     EKG:  Was ordered and interpreted by me today. Shows NSR, 72 bpm, no acute s/t/ changes  Recent Labs: 09/11/2017: ALT 17; BUN 10; Creatinine, Ser 0.78; Hemoglobin 14.6; Platelets 219; Potassium 4.0; Sodium 137; TSH 1.640  09/11/2017: Cholesterol, Total 221; HDL 58; LDL Calculated 115; Triglycerides 242   CrCl cannot  be calculated (Patient's most recent lab result is older than the maximum 21 days allowed.).   Wt Readings from Last 3 Encounters:  11/02/17 155 lb (70.3 kg)  10/16/17 154 lb (69.9 kg)  09/21/17 154 lb (69.9 kg)     Other studies reviewed: Additional studies/records reviewed today include: summarized above  ASSESSMENT AND PLAN:  1. Paroxysmal SVT: Long discussion with patient and husband regarding education on SVT as well as treatment options.  Start Toprol-XL 12.5 mg daily.  Escalate therapy as needed based on breakthrough ectopy.  Should she have recurrent breakthrough ectopy despite optimization of medical therapy she could be seen by EP for SVT ablation.  Recent labs from 09/2017 demonstrated normal thyroid function and potassium at goal.  2. Lightheadedness/near syncope: No further episodes.  No significant arrhythmias or pauses, including a posttermination pauses following episodes of paroxysmal SVT, noted on her ZIO monitor.  She did have an episode of near syncope while wearing this monitor.  She has MRI of the brain as well as MRA of the head and neck pending.  Agree with maintaining hydration.  3. Shortness of breath/atypical chest pain: Resolved.  Echocardiogram unrevealing.  Recent Myoview without significant ischemia.  Some of the symptoms may be in the setting of her paroxysmal SVT.  Start Toprol as above.  Follow-up with PCP for noncardiac chest  pain.  Disposition: F/u with Dr. Saunders Revel or an APP in 4 to 6 weeks.  Current medicines are reviewed at length with the patient today.  The patient did not have any concerns regarding medicines.  Signed, Christell Faith, PA-C 11/02/2017 2:13 PM     [place] [address] [place], [place] [address] [phone]

## 2017-11-02 NOTE — Patient Instructions (Signed)
Medication Instructions: - Your physician has recommended you make the following change in your medication:   1) START toprol (metoprolol succinate) 25 mg- take 1/2 tablet (12.5 mg) by mouth once daily  Labwork: - none ordered  Procedures/Testing: - none ordered  Follow-Up: - Your physician recommends that you schedule a follow-up appointment in: 4 weeks with Dr. Ernie Avena, PA   Any Additional Special Instructions Will Be Listed Below (If Applicable).     If you need a refill on your cardiac medications before your next appointment, please call your pharmacy.

## 2017-11-13 ENCOUNTER — Encounter

## 2017-11-13 ENCOUNTER — Ambulatory Visit: Payer: Medicare Other | Admitting: Neurology

## 2017-11-29 ENCOUNTER — Ambulatory Visit (INDEPENDENT_AMBULATORY_CARE_PROVIDER_SITE_OTHER): Payer: Medicare Other | Admitting: Physician Assistant

## 2017-11-29 ENCOUNTER — Encounter: Payer: Self-pay | Admitting: Physician Assistant

## 2017-11-29 VITALS — BP 110/78 | HR 66 | Ht 65.0 in | Wt 155.5 lb

## 2017-11-29 DIAGNOSIS — R55 Syncope and collapse: Secondary | ICD-10-CM | POA: Diagnosis not present

## 2017-11-29 DIAGNOSIS — R42 Dizziness and giddiness: Secondary | ICD-10-CM

## 2017-11-29 DIAGNOSIS — I471 Supraventricular tachycardia, unspecified: Secondary | ICD-10-CM

## 2017-11-29 DIAGNOSIS — R05 Cough: Secondary | ICD-10-CM

## 2017-11-29 DIAGNOSIS — R0602 Shortness of breath: Secondary | ICD-10-CM

## 2017-11-29 DIAGNOSIS — R0789 Other chest pain: Secondary | ICD-10-CM | POA: Diagnosis not present

## 2017-11-29 DIAGNOSIS — R059 Cough, unspecified: Secondary | ICD-10-CM

## 2017-11-29 NOTE — Progress Notes (Signed)
Cardiology Office Note Date:  11/29/2017  Patient ID:  Andrea, Savage Dec 28, 1945, MRN [id number] PCP:  Valerie Roys, DO  Cardiologist:  Dr. Saunders Revel, MD    Chief Complaint: Follow up  History of Present Illness: Andrea Savage is a 72 y.o. female with history of recurrent syncope/near-syncope, paroxysmal SVT, possible asthma, fibromyalgia, depression, and post-herpetic neuralgia who presents for follow up of her SVT.  Patient reports her first episode of near syncope occurred around 2013. At that time, she noted her arms and legs went numb with associated darkening of her vision. The symptoms lasted for over an hour. She was able to drive home. She was profoundly weak and required assistance climbing the 2 stairs of her house to get inside. She was evaluated in the ED and kept for overnight observation at that time with no clear etiology of symptoms being identified. This has happened sporadically over the past 6 years. Most recent MRI of the brain done in 12/2015 for hearing loss was unrevealing.    She has noted several episodes of near syncope in the summer 2019. There have been no clear precipitants other than a lightheadedness that occurs after coughing. She has a history of asthma and has recently been treated for asthma exacerbation in early 09/2017 with troponin being negative. Patient had a lightheaded episode at her PCPs office the following day on 7/8 that required 1.5 hours of laying down prior to improvement in symptoms. She was referred to cardiology and initially evaluated on 09/13/2017. She denies any previously known history of cardiac disease. She reported having previously undergone remote echocardiogram and stress testing with results being normal (these are not available for review). She noted her cough had been particularly worse over the past several months despite 2 courses of antibiotics and prednisone. She has also undergone vocal cord injections for possible  laryngeal spasm. She has completed speech therapy but has not noticed any improvement in her symptoms. Lastly, she noted an occasional vague achiness in her chest that was not exertional or related to her coughing episodes. She had attributed this to acid reflux. She had a planned foot surgery for mid July, 2019 and needed cardiac evaluation. She underwent echocardiogram on 09/14/2017 that showed an EF of 60 to 65%, normal wall motion, grade 1 diastolic dysfunction, mild mitral regurgitation, normal size left atrium, RV systolic function normal, PASP 34 mmHg. Nuclear stress testing on 09/18/2017 was low risk with an EF greater than 65%. Outpatient cardiac monitoring for 12 days and 19 hours demonstrated a predominant rhythm of sinus with an average heart rate of 76 bpm with a range of 53 to 135 bpm in sinus and a maximum rate of 203 bpm overall. There were rare PACs and PVCs. There were 33 atrial runs with a maximal rate of 203 bpm and the longest lasting 17.3 seconds. There are no sustained arrhythmias or prolonged pauses. Patient triggered events corresponded to sinus rhythm, PACs, and atrial runs. Patient ultimately underwent successful foot surgery on 09/21/2017. She was evaluated by neurology on 10/16/2017 for her recurrent near syncope/syncope. They have ordered an EEG, MRI brain, as well as an MRA of the head and neck. EEG was performed on 10/19/2017 that was normal. MRA/MRI pending at this time. She was last seen in follow up on 11/02/2017 and was doing reasonably well from a cardiac perspective without any further syncope. She was started on Toprol XL 12.5 mg daily along with maintaining hydration.   She comes in  doing well today. She has not had any further presyncopal or syncopal episodes since she was last seen. She was started on Protonix 40 mg daily by ENT with significant improvement in her cough. She is tolerating Toprol XL without issues and has not noted any palpitations. No chest pain or dyspnea. She  reports she has not felt this good in "a long time." BP and HR remain well controlled at home. She was having some abdominal discomfort the week prior while at the beach, which has improved since making some dietary changes. Of note, her husband was having similar abdominal discomfort at the same time as well.   Past Medical History:  Diagnosis Date  . Asthma   . Depression   . Fibromyalgia   . GERD (gastroesophageal reflux disease)   . H/O scarlet fever    as child  . Heart murmur    s/p scarlet fever as child  . IBS (irritable bowel syndrome)   . Low bone density   . Osteopenia   . Reflux   . Rosacea   . Wears hearing aid in both ears     Past Surgical History:  Procedure Laterality Date  . ABDOMINAL HYSTERECTOMY  2009   Total  . BREAST EXCISIONAL BIOPSY Left 1979   NEG  . COLONOSCOPY  [id number]   repeat 10 years  . EXCISION MORTON'S NEUROMA Left 09/21/2017   Procedure: EXCISION MORTON'S NEUROMA;  Surgeon: Albertine Patricia, DPM;  Location: [facility];  Service: Podiatry;  Laterality: Left;  . FOOT SURGERY Right 1990  . HAND SURGERY Right   . LAPAROSCOPIC OOPHERECTOMY    . TONSILLECTOMY    . WEIL OSTEOTOMY Left 09/21/2017   Procedure: WEIL OSTEOTOMY-2ND & 3RD TOES;  Surgeon: Albertine Patricia, DPM;  Location: [facility];  Service: Podiatry;  Laterality: Left;  LMA WITH LOCAL MINI MONSTER DR TROXLER WANTS TED HOSE ON PATIENT'S RIGHT LEG    Current Meds  Medication Sig  . albuterol (PROVENTIL HFA;VENTOLIN HFA) 108 (90 Base) MCG/ACT inhaler Inhale 2 puffs into the lungs every 6 (six) hours as needed for wheezing or shortness of breath.  . beclomethasone (QVAR) 40 MCG/ACT inhaler Inhale 2 puffs into the lungs 2 (two) times daily.  . benzonatate (TESSALON) 200 MG capsule Take 1 capsule (200 mg total) by mouth 2 (two) times daily as needed for cough.  Marland Kitchen CALCIUM-MAGNESIUM-ZINC PO Take by mouth daily.  . citalopram (CELEXA) 40 MG tablet Take 1 tablet (20 mg total)  by mouth daily.  Marland Kitchen Dextromethorphan-guaiFENesin ([place] DM PO) Take by mouth as needed.   . metoprolol succinate (TOPROL-XL) 25 MG 24 hr tablet Take 0.5 tablets (12.5 mg total) by mouth daily.  . montelukast (SINGULAIR) 10 MG tablet Take 10 mg by mouth at bedtime.  . pantoprazole (PROTONIX) 40 MG tablet Take 40 mg by mouth daily.  . Probiotic Product (PROBIOTIC PO) Take by mouth daily.  . ranitidine (ZANTAC) 300 MG tablet Take 300 mg by mouth at bedtime.     Allergies:   Compazine [prochlorperazine]   Social History:  The patient  reports that she has never smoked. She has never used smokeless tobacco. She reports that she drinks about 1.0 standard drinks of alcohol per week. She reports that she does not use drugs.   Family History:  The patient's family history includes Asthma in her father; Breast cancer in her mother; Cancer in her father and mother; Diabetes in her sister; Heart disease in her father; Kidney disease in her  brother; Liver disease in her sister; Thyroid disease in her brother and sister.  ROS:   Review of Systems  Constitutional: Negative for chills, diaphoresis, fever, malaise/fatigue and weight loss.  HENT: Negative for congestion.   Eyes: Negative for discharge and redness.  Respiratory: Positive for cough. Negative for hemoptysis, sputum production, shortness of breath and wheezing.        Much improved  Cardiovascular: Negative for chest pain, palpitations, orthopnea, claudication, leg swelling and PND.  Gastrointestinal: Positive for abdominal pain. Negative for blood in stool, heartburn, melena, nausea and vomiting.  Genitourinary: Negative for hematuria.  Musculoskeletal: Negative for falls and myalgias.  Skin: Negative for rash.  Neurological: Negative for dizziness, tingling, tremors, sensory change, speech change, focal weakness, loss of consciousness and weakness.  Endo/Heme/Allergies: Does not bruise/bleed easily.  Psychiatric/Behavioral: Negative for  substance abuse. The patient is not nervous/anxious.      PHYSICAL EXAM:  VS:  BP 110/78 (BP Location: Left Arm, Patient Position: Sitting, Cuff Size: Normal)   Pulse 66   Ht 5\' 5"  (1.651 m)   Wt 155 lb 8 oz (70.5 kg)   BMI 25.88 kg/m  BMI: Body mass index is 25.88 kg/m.  Physical Exam  Constitutional: She is oriented to person, place, and time. She appears well-developed and well-nourished.  HENT:  Head: Normocephalic and atraumatic.  Eyes: Right eye exhibits no discharge. Left eye exhibits no discharge.  Neck: Normal range of motion. No JVD present.  Cardiovascular: Normal rate, regular rhythm, S1 normal, S2 normal and normal heart sounds. Exam reveals no distant heart sounds, no friction rub, no midsystolic click and no opening snap.  No murmur heard. Pulses:      Posterior tibial pulses are 2+ on the right side, and 2+ on the left side.  Pulmonary/Chest: Effort normal and breath sounds normal. No respiratory distress. She has no decreased breath sounds. She has no wheezes. She has no rales. She exhibits no tenderness.  Abdominal: Soft. She exhibits no distension. There is no tenderness.  Musculoskeletal: She exhibits no edema.  Neurological: She is alert and oriented to person, place, and time.  Skin: Skin is warm and dry. No cyanosis. Nails show no clubbing.  Psychiatric: She has a normal mood and affect. Her speech is normal and behavior is normal. Judgment and thought content normal.     EKG:  Was ordered and interpreted by me today. Shows NSR, 66 bpm, no acute st/t changes  Recent Labs: 09/11/2017: ALT 17; BUN 10; Creatinine, Ser 0.78; Hemoglobin 14.6; Platelets 219; Potassium 4.0; Sodium 137; TSH 1.640  09/11/2017: Cholesterol, Total 221; HDL 58; LDL Calculated 115; Triglycerides 242   CrCl cannot be calculated (Patient's most recent lab result is older than the maximum 21 days allowed.).   Wt Readings from Last 3 Encounters:  11/29/17 155 lb 8 oz (70.5 kg)  11/02/17 155  lb (70.3 kg)  10/16/17 154 lb (69.9 kg)     Other studies reviewed: Additional studies/records reviewed today include: summarized above  ASSESSMENT AND PLAN:  1. Paroxysmal SVT: No further episodes since starting Toprol XL 12.5 mg bid, will continue at this time. Should she have recurrent breakthrough ectopy despite optimization of medical therapy she could be seen by EP for SVT ablation.  Recent labs from 09/2017 demonstrated normal thyroid function and potassium at goal.  2. Lightheadedness/near syncope: No further episodes. She continues to await scheduling of her MRI/MRA head/brain. Continue adequate hydration.   3. SOB/atypical chest pain: Resolved. Echocardiogram unrevealing. Recent  Myoview without significant ischemia. Some of the symptoms may be in the setting of her paroxysmal SVT.  4. Cough: Much improved since starting Protonix.    Disposition: F/u with Dr. Saunders Revel or an APP in 6 months, sooner if needed.   Current medicines are reviewed at length with the patient today.  The patient did not have any concerns regarding medicines.  Signed, Christell Faith, PA-C 11/29/2017 2:31 PM     [facility] [address] [place] [address] [place], [place] [address] [phone]

## 2017-11-29 NOTE — Patient Instructions (Signed)
Medication Instructions: - Your physician recommends that you continue on your current medications as directed. Please refer to the Current Medication list given to you today.  Labwork: - none ordered  Procedures/Testing: - none ordered  Follow-Up: - Your physician wants you to follow-up in: 6 months with Dr. Ernie Avena, PA. You will receive a reminder letter in the mail/ call two months in advance. If you don't receive a letter/ call, please call our office to schedule the follow-up appointment.   Any Additional Special Instructions Will Be Listed Below (If Applicable).     If you need a refill on your cardiac medications before your next appointment, please call your pharmacy.

## 2017-11-30 ENCOUNTER — Ambulatory Visit (INDEPENDENT_AMBULATORY_CARE_PROVIDER_SITE_OTHER): Payer: Medicare Other

## 2017-11-30 VITALS — BP 122/70 | HR 58 | Temp 97.8°F | Resp 16 | Ht 64.5 in | Wt 155.8 lb

## 2017-11-30 DIAGNOSIS — Z1231 Encounter for screening mammogram for malignant neoplasm of breast: Secondary | ICD-10-CM

## 2017-11-30 DIAGNOSIS — Z0000 Encounter for general adult medical examination without abnormal findings: Secondary | ICD-10-CM | POA: Diagnosis not present

## 2017-11-30 DIAGNOSIS — Z23 Encounter for immunization: Secondary | ICD-10-CM | POA: Diagnosis not present

## 2017-11-30 DIAGNOSIS — Z1239 Encounter for other screening for malignant neoplasm of breast: Secondary | ICD-10-CM

## 2017-11-30 NOTE — Progress Notes (Signed)
Subjective:   Andrea Savage is a 72 y.o. female who presents for Medicare Annual (Subsequent) preventive examination.  Review of Systems:   Cardiac Risk Factors include: advanced age (>18men, >13 women)     Objective:     Vitals: BP 122/70 (BP Location: Left Arm, Patient Position: Sitting)   Pulse (!) 58   Temp 97.8 F (36.6 C) (Temporal)   Resp 16   Ht 5' 4.5" (1.638 m)   Wt 155 lb 12.8 oz (70.7 kg)   SpO2 96%   BMI 26.33 kg/m   Body mass index is 26.33 kg/m.  Advanced Directives 11/30/2017 09/21/2017 11/30/2016 06/29/2015  Does Patient Have a Medical Advance Directive? Yes No No No  Type of Paramedic of [place];Living will - - -  Copy of [place] in Chart? No - copy requested - - -  Would patient like information on creating a medical advance directive? - No - Patient declined Yes (MAU/Ambulatory/Procedural Areas - Information given) Yes - Educational materials given    Tobacco Social History   Tobacco Use  Smoking Status Never Smoker  Smokeless Tobacco Never Used     Counseling given: Not Answered   Clinical Intake:  Pre-visit preparation completed: Yes  Pain : No/denies pain     Nutritional Status: BMI 25 -29 Overweight Nutritional Risks: None Diabetes: No  How often do you need to have someone help you when you read instructions, pamphlets, or other written materials from your doctor or pharmacy?: 1 - Never What is the last grade level you completed in school?: some college   Interpreter Needed?: No  Information entered by :: Andrea Bunker,LPN   Past Medical History:  Diagnosis Date  . Asthma   . Depression   . Fibromyalgia   . GERD (gastroesophageal reflux disease)   . H/O scarlet fever    as child  . Heart murmur    s/p scarlet fever as child  . IBS (irritable bowel syndrome)   . Low bone density   . Osteopenia   . Reflux   . Rosacea   . Wears hearing aid in both ears    Past Surgical  History:  Procedure Laterality Date  . ABDOMINAL HYSTERECTOMY  2009   Total  . BREAST EXCISIONAL BIOPSY Left 1979   NEG  . COLONOSCOPY  [id number]   repeat 10 years  . EXCISION MORTON'S NEUROMA Left 09/21/2017   Procedure: EXCISION MORTON'S NEUROMA;  Surgeon: Albertine Patricia, DPM;  Location: [facility];  Service: Podiatry;  Laterality: Left;  . FOOT SURGERY Right 1990  . HAND SURGERY Right   . LAPAROSCOPIC OOPHERECTOMY    . TONSILLECTOMY    . WEIL OSTEOTOMY Left 09/21/2017   Procedure: WEIL OSTEOTOMY-2ND & 3RD TOES;  Surgeon: Albertine Patricia, DPM;  Location: [facility];  Service: Podiatry;  Laterality: Left;  LMA WITH LOCAL MINI MONSTER DR TROXLER WANTS TED HOSE ON PATIENT'S RIGHT LEG   Family History  Problem Relation Age of Onset  . Cancer Mother        breast  . Breast cancer Mother   . Cancer Father        pancreatic  . Asthma Father   . Heart disease Father   . Liver disease Sister   . Diabetes Sister   . Thyroid disease Sister   . Thyroid disease Brother   . Kidney disease Brother    Social History   Socioeconomic History  . Marital status: Married  Spouse name: Not on file  . Number of children: Not on file  . Years of education: Not on file  . Highest education level: Some college, no degree  Occupational History  . Not on file  Social Needs  . Financial resource strain: Not hard at all  . Food insecurity:    Worry: Never true    Inability: Never true  . Transportation needs:    Medical: No    Non-medical: No  Tobacco Use  . Smoking status: Never Smoker  . Smokeless tobacco: Never Used  Substance and Sexual Activity  . Alcohol use: Yes    Alcohol/week: 1.0 standard drinks    Types: 1 Glasses of wine per week    Comment: on occasion  . Drug use: No  . Sexual activity: Never  Lifestyle  . Physical activity:    Days per week: 0 days    Minutes per session: 0 min  . Stress: Not at all  Relationships  . Social connections:     Talks on phone: More than three times a week    Gets together: More than three times a week    Attends religious service: More than 4 times per year    Active member of club or organization: Yes    Attends meetings of clubs or organizations: More than 4 times per year    Relationship status: Married  Other Topics Concern  . 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 albuterol (PROVENTIL HFA;VENTOLIN HFA) 108 (90 Base) MCG/ACT inhaler Inhale 2 puffs into the lungs every 6 (six) hours as needed for wheezing or shortness of breath.  . beclomethasone (QVAR) 40 MCG/ACT inhaler Inhale 2 puffs into the lungs 2 (two) times daily.  . benzonatate (TESSALON) 200 MG capsule Take 1 capsule (200 mg total) by mouth 2 (two) times daily as needed for cough.  Marland Kitchen CALCIUM-MAGNESIUM-ZINC PO Take by mouth daily.  . citalopram (CELEXA) 40 MG tablet Take 1 tablet (20 mg total) by mouth daily.  . metoprolol succinate (TOPROL-XL) 25 MG 24 hr tablet Take 0.5 tablets (12.5 mg total) by mouth daily.  . pantoprazole (PROTONIX) 40 MG tablet Take 40 mg by mouth daily.  . ranitidine (ZANTAC) 300 MG tablet Take 300 mg by mouth at bedtime.   Marland Kitchen Dextromethorphan-guaiFENesin ([place] DM PO) Take by mouth as needed.   . montelukast (SINGULAIR) 10 MG tablet Take 10 mg by mouth at bedtime.  . Probiotic Product (PROBIOTIC PO) Take by mouth daily.   No facility-administered encounter medications on file as of 11/30/2017.     Activities of Daily Living In your present state of health, do you have any difficulty performing the following activities: 11/30/2017 09/21/2017  Hearing? Y N  Comment biltaeral hearing aids  -  Vision? Y N  Comment problems with right eye when reading  -  Difficulty concentrating or making decisions? N N    Walking or climbing stairs? N N  Comment some porblems with right knee -  Dressing or bathing? N N  Doing errands, shopping? N -  Preparing Food and eating ? N -  Using the Toilet? N -  In the past six months, have you accidently leaked urine? Y -  Comment wears panty liner for  protection  -  Do you have problems with loss of bowel control? N -  Managing your Medications? N -  Managing your Finances? N -  Housekeeping or managing your Housekeeping? N -  Some recent data might be hidden    Patient Care Team: Valerie Roys, DO as PCP - General (Family Medicine) Pa, [place] (Optometry) Beverly Gust, MD (Unknown Physician Specialty) Delora Fuel, MD as Referring Physician (Audiology) End, Harrell Gave, MD as Consulting Physician (Cardiology) Cameron Sprang, MD as Consulting Physician (Neurology) Elvina Mattes, Adele Schilder as Attending Physician (Podiatry)    Assessment:   This is a routine wellness examination for [place].  Exercise Activities and Dietary recommendations Current Exercise Habits: The patient does not participate in regular exercise at present, Exercise limited by: None identified  Goals    . DIET - INCREASE WATER INTAKE     Recommend drinking at least 6-8 glasses of water a day        Fall Risk Fall Risk  11/30/2017 10/16/2017 08/25/2017 11/30/2016 08/08/2016  Falls in the past year? No No No Yes No  Number falls in past yr: - - - 2 or more -  Injury with Fall? - - - No -  Comment - - - - -  Risk Factor Category  - - - High Fall Risk -  Comment - - - states she sustained recent hearing loss and has recently gotten hearing aids -  Risk for fall due to : - - - Impaired balance/gait;Impaired vision -  Risk for fall due to: Comment - - - recently developed hearing loss and now has hearing aids. Also wears glasses -  Follow up - - - Education provided;Falls prevention discussed -   Is the patient's home free of loose throw rugs in walkways, pet  beds, electrical cords, etc?   yes      Grab bars in the bathroom? no      Handrails on the stairs?   yes      Adequate lighting?   yes  Timed Get Up and Go performed: Completed in 8 seconds with no use of assistive devices, steady gait. No intervention needed at this time.   Depression Screen PHQ 2/9 Scores 11/30/2017 09/11/2017 11/30/2016 11/12/2015  PHQ - 2 Score 0 0 0 0  PHQ- 9 Score - 0 - -  Exception Documentation - - - -     Cognitive Function     6CIT Screen 11/30/2017 11/30/2016  What Year? 0 points 0 points  What month? 0 points 0 points  What time? 0 points 0 points  Count back from 20 0 points 0 points  Months in reverse 0 points 2 points  Repeat phrase 0 points 0 points  Total Score 0 2    Immunization History  Administered Date(s) Administered  . Hepatitis B 03/17/2008, 05/05/2008, 08/05/2008  . Influenza, High Dose Seasonal PF 11/30/2016, 11/30/2017  . Influenza-Unspecified 11/06/2015  . Pneumococcal Conjugate-13 11/30/2016  . Pneumococcal Polysaccharide-23 11/30/2017  . Td 03/07/1997, 12/01/2008  . Zoster 03/20/2015   Influenza:  Pneumococcal:  Tetanus: completed 12/01/2008   Qualifies for Shingles Vaccine? Yes, discussed shingrix vaccine   Screening Tests Health Maintenance  Topic Date Due  . INFLUENZA VACCINE  10/05/2017  . PNA vac Low Risk Adult (2 of 2 - PPSV23) 11/30/2017  . MAMMOGRAM  01/17/2018  . TETANUS/TDAP  12/02/2018  . COLONOSCOPY  01/18/2021  . DEXA SCAN  Completed  . Hepatitis C Screening  Completed  Cancer Screenings: Lung: Low Dose CT Chest recommended if Age 67-80 years, 30 pack-year currently smoking OR have quit w/in 15years. Patient does not qualify. Breast:  Up to date on Mammogram? Yes  01/18/2016 Up to date of Bone Density/Dexa? Yes 06/05/2008 Colorectal: completed 01/19/2011  Additional Screenings:  Hepatitis C Screening: completed 03/07/2009     Plan:    I have personally reviewed and addressed the Medicare Annual  Wellness questionnaire and have noted the following in the patient's chart:  A. Medical and social history B. Use of alcohol, tobacco or illicit drugs  C. Current medications and supplements D. Functional ability and status E.  Nutritional status F.  Physical activity G. Advance directives H. List of other physicians I.  Hospitalizations, surgeries, and ER visits in previous 12 months J.  [place] such as hearing and vision if needed, cognitive and depression L. Referrals and appointments   In addition, I have reviewed and discussed with patient certain preventive protocols, quality metrics, and best practice recommendations. A written personalized care plan for preventive services as well as general preventive health recommendations were provided to patient.   Signed,  Tyler Aas, LPN Nurse Health Advisor   Nurse Notes:none

## 2017-11-30 NOTE — Patient Instructions (Addendum)
Andrea Savage , Thank you for taking time to come for your Medicare Wellness Visit. I appreciate your ongoing commitment to your health goals. Please review the following plan we discussed and let me know if I can assist you in the future.   Screening recommendations/referrals: Colonoscopy: completed 01/19/2011 Mammogram: completed 01/18/2016 Please call [phone] to schedule your mammogram.  Bone Density: completed 06/05/2008 Recommended yearly ophthalmology/optometry visit for glaucoma screening and checkup Recommended yearly dental visit for hygiene and checkup  Vaccinations: Influenza vaccine: done today  Pneumococcal vaccine: pneumovax completed today, completed series  Tetanus vaccine: up to date  Shingles vaccine: shingrix eligible, check with your insurance company for coverage   Advanced directives: Please bring a copy of your health care power of attorney and living will to the office at your convenience.  Conditions/risks identified: Recommend drinking at least 6-8 glasses of water a day   Next appointment: Follow up in one year for your annual wellness exam.    Preventive Care 65 Years and Older, Female Preventive care refers to lifestyle choices and visits with your health care provider that can promote health and wellness. What does preventive care include?  A yearly physical exam. This is also called an annual well check.  Dental exams once or twice a year.  Routine eye exams. Ask your health care provider how often you should have your eyes checked.  Personal lifestyle choices, including:  Daily care of your teeth and gums.  Regular physical activity.  Eating a healthy diet.  Avoiding tobacco and drug use.  Limiting alcohol use.  Practicing safe sex.  Taking low-dose aspirin every day.  Taking vitamin and mineral supplements as recommended by your health care provider. What happens during an annual well check? The services and screenings done by your  health care provider during your annual well check will depend on your age, overall health, lifestyle risk factors, and family history of disease. Counseling  Your health care provider may ask you questions about your:  Alcohol use.  Tobacco use.  Drug use.  Emotional well-being.  Home and relationship well-being.  Sexual activity.  Eating habits.  History of falls.  Memory and ability to understand (cognition).  Work and work Statistician.  Reproductive health. Screening  You may have the following tests or measurements:  Height, weight, and BMI.  Blood pressure.  Lipid and cholesterol levels. These may be checked every 5 years, or more frequently if you are over 6 years old.  Skin check.  Lung cancer screening. You may have this screening every year starting at age 19 if you have a 30-pack-year history of smoking and currently smoke or have quit within the past 15 years.  Fecal occult blood test (FOBT) of the stool. You may have this test every year starting at age 64.  Flexible sigmoidoscopy or colonoscopy. You may have a sigmoidoscopy every 5 years or a colonoscopy every 10 years starting at age 45.  Hepatitis C blood test.  Hepatitis B blood test.  Sexually transmitted disease (STD) testing.  Diabetes screening. This is done by checking your blood sugar (glucose) after you have not eaten for a while (fasting). You may have this done every 1-3 years.  Bone density scan. This is done to screen for osteoporosis. You may have this done starting at age 72.  Mammogram. This may be done every 1-2 years. Talk to your health care provider about how often you should have regular mammograms. Talk with your health care provider about your  test results, treatment options, and if necessary, the need for more tests. Vaccines  Your health care provider may recommend certain vaccines, such as:  Influenza vaccine. This is recommended every year.  Tetanus, diphtheria, and  acellular pertussis (Tdap, Td) vaccine. You may need a Td booster every 10 years.  Zoster vaccine. You may need this after age 79.  Pneumococcal 13-valent conjugate (PCV13) vaccine. One dose is recommended after age 57.  Pneumococcal polysaccharide (PPSV23) vaccine. One dose is recommended after age 52. Talk to your health care provider about which screenings and vaccines you need and how often you need them. This information is not intended to replace advice given to you by your health care provider. Make sure you discuss any questions you have with your health care provider. Document Released: 03/20/2015 Document Revised: 11/11/2015 Document Reviewed: 12/23/2014 Elsevier Interactive Patient Education  2017 [place] Prevention in the Home Falls can cause injuries. They can happen to people of all ages. There are many things you can do to make your home safe and to help prevent falls. What can I do on the outside of my home?  Regularly fix the edges of walkways and driveways and fix any cracks.  Remove anything that might make you trip as you walk through a door, such as a raised step or threshold.  Trim any bushes or trees on the path to your home.  Use bright outdoor lighting.  Clear any walking paths of anything that might make someone trip, such as rocks or tools.  Regularly check to see if handrails are loose or broken. Make sure that both sides of any steps have handrails.  Any raised decks and porches should have guardrails on the edges.  Have any leaves, snow, or ice cleared regularly.  Use sand or salt on walking paths during winter.  Clean up any spills in your garage right away. This includes oil or grease spills. What can I do in the bathroom?  Use night lights.  Install grab bars by the toilet and in the tub and shower. Do not use towel bars as grab bars.  Use non-skid mats or decals in the tub or shower.  If you need to sit down in the shower, use  a plastic, non-slip stool.  Keep the floor dry. Clean up any water that spills on the floor as soon as it happens.  Remove soap buildup in the tub or shower regularly.  Attach bath mats securely with double-sided non-slip rug tape.  Do not have throw rugs and other things on the floor that can make you trip. What can I do in the bedroom?  Use night lights.  Make sure that you have a light by your bed that is easy to reach.  Do not use any sheets or blankets that are too big for your bed. They should not hang down onto the floor.  Have a firm chair that has side arms. You can use this for support while you get dressed.  Do not have throw rugs and other things on the floor that can make you trip. What can I do in the kitchen?  Clean up any spills right away.  Avoid walking on wet floors.  Keep items that you use a lot in easy-to-reach places.  If you need to reach something above you, use a strong step stool that has a grab bar.  Keep electrical cords out of the way.  Do not use floor polish or wax that  makes floors slippery. If you must use wax, use non-skid floor wax.  Do not have throw rugs and other things on the floor that can make you trip. What can I do with my stairs?  Do not leave any items on the stairs.  Make sure that there are handrails on both sides of the stairs and use them. Fix handrails that are broken or loose. Make sure that handrails are as long as the stairways.  Check any carpeting to make sure that it is firmly attached to the stairs. Fix any carpet that is loose or worn.  Avoid having throw rugs at the top or bottom of the stairs. If you do have throw rugs, attach them to the floor with carpet tape.  Make sure that you have a light switch at the top of the stairs and the bottom of the stairs. If you do not have them, ask someone to add them for you. What else can I do to help prevent falls?  Wear shoes that:  Do not have high heels.  Have  rubber bottoms.  Are comfortable and fit you well.  Are closed at the toe. Do not wear sandals.  If you use a stepladder:  Make sure that it is fully opened. Do not climb a closed stepladder.  Make sure that both sides of the stepladder are locked into place.  Ask someone to hold it for you, if possible.  Clearly mark and make sure that you can see:  Any grab bars or handrails.  First and last steps.  Where the edge of each step is.  Use tools that help you move around (mobility aids) if they are needed. These include:  Canes.  Walkers.  Scooters.  Crutches.  Turn on the lights when you go into a dark area. Replace any light bulbs as soon as they burn out.  Set up your furniture so you have a clear path. Avoid moving your furniture around.  If any of your floors are uneven, fix them.  If there are any pets around you, be aware of where they are.  Review your medicines with your doctor. Some medicines can make you feel dizzy. This can increase your chance of falling. Ask your doctor what other things that you can do to help prevent falls. This information is not intended to replace advice given to you by your health care provider. Make sure you discuss any questions you have with your health care provider. Document Released: 12/18/2008 Document Revised: 07/30/2015 Document Reviewed: 03/28/2014 Elsevier Interactive Patient Education  2017 [place].  Pneumococcal Polysaccharide Vaccine: What You Need to Know 1. Why get vaccinated? Vaccination can protect older adults (and some children and younger adults) from pneumococcal disease. Pneumococcal disease is caused by bacteria that can spread from person to person through close contact. It can cause ear infections, and it can also lead to more serious infections of the:  Lungs (pneumonia),  Blood (bacteremia), and  Covering of the brain and spinal cord (meningitis). Meningitis can cause deafness and brain damage,  and it can be fatal.  Anyone can get pneumococcal disease, but children under 12 years of age, people with certain medical conditions, adults over 37 years of age, and cigarette smokers are at the highest risk. About 18,000 older adults die each year from pneumococcal disease in the [place]. Treatment of pneumococcal infections with penicillin and other drugs used to be more effective. But some strains of the disease have become resistant to  these drugs. This makes prevention of the disease, through vaccination, even more important. 2. Pneumococcal polysaccharide vaccine (PPSV23) Pneumococcal polysaccharide vaccine (PPSV23) protects against 23 types of pneumococcal bacteria. It will not prevent all pneumococcal disease. PPSV23 is recommended for:  All adults 38 years of age and older,  Anyone 2 through 72 years of age with certain long-term health problems,  Anyone 2 through 72 years of age with a weakened immune system,  Adults 62 through 72 years of age who smoke cigarettes or have asthma.  Most people need only one dose of PPSV. A second dose is recommended for certain high-risk groups. People 42 and older should get a dose even if they have gotten one or more doses of the vaccine before they turned 65. Your healthcare provider can give you more information about these recommendations. Most healthy adults develop protection within 2 to 3 weeks of getting the shot. 3. Some people should not get this vaccine  Anyone who has had a life-threatening allergic reaction to PPSV should not get another dose.  Anyone who has a severe allergy to any component of PPSV should not receive it. Tell your provider if you have any severe allergies.  Anyone who is moderately or severely ill when the shot is scheduled may be asked to wait until they recover before getting the vaccine. Someone with a mild illness can usually be vaccinated.  Children less than 23 years of age should not receive this  vaccine.  There is no evidence that PPSV is harmful to either a pregnant woman or to her fetus. However, as a precaution, women who need the vaccine should be vaccinated before becoming pregnant, if possible. 4. Risks of a vaccine reaction With any medicine, including vaccines, there is a chance of side effects. These are usually mild and go away on their own, but serious reactions are also possible. About half of people who get PPSV have mild side effects, such as redness or pain where the shot is given, which go away within about two days. Less than 1 out of 100 people develop a fever, muscle aches, or more severe local reactions. Problems that could happen after any vaccine:  People sometimes faint after a medical procedure, including vaccination. Sitting or lying down for about 15 minutes can help prevent fainting, and injuries caused by a fall. Tell your doctor if you feel dizzy, or have vision changes or ringing in the ears.  Some people get severe pain in the shoulder and have difficulty moving the arm where a shot was given. This happens very rarely.  Any medication can cause a severe allergic reaction. Such reactions from a vaccine are very rare, estimated at about 1 in a million doses, and would happen within a few minutes to a few hours after the vaccination. As with any medicine, there is a very remote chance of a vaccine causing a serious injury or death. The safety of vaccines is always being monitored. For more information, visit: http://www.aguilar.org/ 5. What if there is a serious reaction? What should I look for? Look for anything that concerns you, such as signs of a severe allergic reaction, very high fever, or unusual behavior. Signs of a severe allergic reaction can include hives, swelling of the face and throat, difficulty breathing, a fast heartbeat, dizziness, and weakness. These would usually start a few minutes to a few hours after the vaccination. What should I  do? If you think it is a severe allergic reaction or other emergency  that can't wait, call 9-1-1 or get to the nearest hospital. Otherwise, call your doctor. Afterward, the reaction should be reported to the Vaccine Adverse Event Reporting System (VAERS). Your doctor might file this report, or you can do it yourself through the VAERS web site at www.vaers.SamedayNews.es, or by calling (867)010-9822. VAERS does not give medical advice. 6. How can I learn more?  Ask your doctor. He or she can give you the vaccine package insert or suggest other sources of information.  Call your local or state health department.  Contact the Centers for Disease Control and Prevention (CDC): ? Call (715)618-1627 (1-800-CDC-INFO) or ? Visit CDC's website at http://hunter.com/ CDC Pneumococcal Polysaccharide Vaccine VIS (06/28/13) This information is not intended to replace advice given to you by your health care provider. Make sure you discuss any questions you have with your health care provider. Document Released: 12/19/2005 Document Revised: 11/12/2015 Document Reviewed: 11/12/2015 Elsevier Interactive Patient Education  2017 [place].   Influenza (Flu) Vaccine (Inactivated or Recombinant): What You Need to Know 1. Why get vaccinated? Influenza ("flu") is a contagious disease that spreads around the [place] every year, usually between October and May. Flu is caused by influenza viruses, and is spread mainly by coughing, sneezing, and close contact. Anyone can get flu. Flu strikes suddenly and can last several days. Symptoms vary by age, but can include:  fever/chills  sore throat  muscle aches  fatigue  cough  headache  runny or stuffy nose  Flu can also lead to pneumonia and blood infections, and cause diarrhea and seizures in children. If you have a medical condition, such as heart or lung disease, flu can make it worse. Flu is more dangerous for some people. Infants and young  children, people 55 years of age and older, pregnant women, and people with certain health conditions or a weakened immune system are at greatest risk. Each year thousands of people in the [place] die from flu, and many more are hospitalized. Flu vaccine can:  keep you from getting flu,  make flu less severe if you do get it, and  keep you from spreading flu to your family and other people. 2. Inactivated and recombinant flu vaccines A dose of flu vaccine is recommended every flu season. Children 6 months through 63 years of age may need two doses during the same flu season. Everyone else needs only one dose each flu season. Some inactivated flu vaccines contain a very small amount of a mercury-based preservative called thimerosal. Studies have not shown thimerosal in vaccines to be harmful, but flu vaccines that do not contain thimerosal are available. There is no live flu virus in flu shots. They cannot cause the flu. There are many flu viruses, and they are always changing. Each year a new flu vaccine is made to protect against three or four viruses that are likely to cause disease in the upcoming flu season. But even when the vaccine doesn't exactly match these viruses, it may still provide some protection. Flu vaccine cannot prevent:  flu that is caused by a virus not covered by the vaccine, or  illnesses that look like flu but are not.  It takes about 2 weeks for protection to develop after vaccination, and protection lasts through the flu season. 3. Some people should not get this vaccine Tell the person who is giving you the vaccine:  If you have any severe, life-threatening allergies. If you ever had a life-threatening allergic reaction after a dose of  flu vaccine, or have a severe allergy to any part of this vaccine, you may be advised not to get vaccinated. Most, but not all, types of flu vaccine contain a small amount of egg protein.  If you ever had Guillain-Barr Syndrome  (also called GBS). Some people with a history of GBS should not get this vaccine. This should be discussed with your doctor.  If you are not feeling well. It is usually okay to get flu vaccine when you have a mild illness, but you might be asked to come back when you feel better.  4. Risks of a vaccine reaction With any medicine, including vaccines, there is a chance of reactions. These are usually mild and go away on their own, but serious reactions are also possible. Most people who get a flu shot do not have any problems with it. Minor problems following a flu shot include:  soreness, redness, or swelling where the shot was given  hoarseness  sore, red or itchy eyes  cough  fever  aches  headache  itching  fatigue  If these problems occur, they usually begin soon after the shot and last 1 or 2 days. More serious problems following a flu shot can include the following:  There may be a small increased risk of Guillain-Barre Syndrome (GBS) after inactivated flu vaccine. This risk has been estimated at 1 or 2 additional cases per million people vaccinated. This is much lower than the risk of severe complications from flu, which can be prevented by flu vaccine.  Young children who get the flu shot along with pneumococcal vaccine (PCV13) and/or DTaP vaccine at the same time might be slightly more likely to have a seizure caused by fever. Ask your doctor for more information. Tell your doctor if a child who is getting flu vaccine has ever had a seizure.  Problems that could happen after any injected vaccine:  People sometimes faint after a medical procedure, including vaccination. Sitting or lying down for about 15 minutes can help prevent fainting, and injuries caused by a fall. Tell your doctor if you feel dizzy, or have vision changes or ringing in the ears.  Some people get severe pain in the shoulder and have difficulty moving the arm where a shot was given. This happens very  rarely.  Any medication can cause a severe allergic reaction. Such reactions from a vaccine are very rare, estimated at about 1 in a million doses, and would happen within a few minutes to a few hours after the vaccination. As with any medicine, there is a very remote chance of a vaccine causing a serious injury or death. The safety of vaccines is always being monitored. For more information, visit: http://www.aguilar.org/ 5. What if there is a serious reaction? What should I look for? Look for anything that concerns you, such as signs of a severe allergic reaction, very high fever, or unusual behavior. Signs of a severe allergic reaction can include hives, swelling of the face and throat, difficulty breathing, a fast heartbeat, dizziness, and weakness. These would start a few minutes to a few hours after the vaccination. What should I do?  If you think it is a severe allergic reaction or other emergency that can't wait, call 9-1-1 and get the person to the nearest hospital. Otherwise, call your doctor.  Reactions should be reported to the Vaccine Adverse Event Reporting System (VAERS). Your doctor should file this report, or you can do it yourself through the VAERS web site at  www.vaers.SamedayNews.es, or by calling (770)160-3049. ? VAERS does not give medical advice. 6. The National Vaccine Injury Compensation Program The [employer] Vaccine Injury Compensation Program (VICP) is a federal program that was created to compensate people who may have been injured by certain vaccines. Persons who believe they may have been injured by a vaccine can learn about the program and about filing a claim by calling 8488723873 or visiting the [place] website at GoldCloset.com.ee. There is a time limit to file a claim for compensation. 7. How can I learn more?  Ask your healthcare provider. He or she can give you the vaccine package insert or suggest other sources of information.  Call your local  or state health department.  Contact the Centers for Disease Control and Prevention (CDC): ? Call (430)619-0882 (1-800-CDC-INFO) or ? Visit CDC's website at https://gibson.com/ Vaccine Information Statement, Inactivated Influenza Vaccine (10/11/2013) This information is not intended to replace advice given to you by your health care provider. Make sure you discuss any questions you have with your health care provider. Document Released: 12/16/2005 Document Revised: 11/12/2015 Document Reviewed: 11/12/2015 Elsevier Interactive Patient Education  2017 [employer].

## 2017-12-18 ENCOUNTER — Other Ambulatory Visit: Payer: Self-pay | Admitting: Family Medicine

## 2017-12-18 ENCOUNTER — Telehealth: Payer: Self-pay | Admitting: Family Medicine

## 2017-12-18 DIAGNOSIS — N644 Mastodynia: Secondary | ICD-10-CM

## 2017-12-18 NOTE — Telephone Encounter (Signed)
Orders already in.

## 2017-12-18 NOTE — Telephone Encounter (Signed)
Copied from [place] [id number]. Topic: Referral - Request for Referral >> Dec 18, 2017  9:20 AM Andrea Savage, NT wrote: Patient is calling and states she called [facility] to get her mammogram done and she has  sore spot on her left breast and they are needing a diagnostic mammogram. She states the office is faxing over papers for this. >> Dec 18, 2017  9:45 AM Don Perking M wrote: Can an order be placed without the pt coming in?

## 2017-12-26 ENCOUNTER — Ambulatory Visit
Admission: RE | Admit: 2017-12-26 | Discharge: 2017-12-26 | Disposition: A | Discharge status: Home / Self Care | Payer: Medicare Other | Source: Ambulatory Visit | Attending: Family Medicine | Admitting: Family Medicine

## 2017-12-26 DIAGNOSIS — N644 Mastodynia: Secondary | ICD-10-CM

## 2017-12-28 ENCOUNTER — Other Ambulatory Visit: Payer: Self-pay | Admitting: Family Medicine

## 2018-03-14 DIAGNOSIS — M7752 Other enthesopathy of left foot: Secondary | ICD-10-CM | POA: Diagnosis not present

## 2018-03-14 DIAGNOSIS — M79672 Pain in left foot: Secondary | ICD-10-CM | POA: Diagnosis not present

## 2018-04-06 ENCOUNTER — Encounter: Payer: Self-pay | Admitting: Nurse Practitioner

## 2018-04-06 ENCOUNTER — Ambulatory Visit (INDEPENDENT_AMBULATORY_CARE_PROVIDER_SITE_OTHER): Payer: PPO | Admitting: Nurse Practitioner

## 2018-04-06 DIAGNOSIS — J4541 Moderate persistent asthma with (acute) exacerbation: Secondary | ICD-10-CM

## 2018-04-06 MED ORDER — AZITHROMYCIN 250 MG PO TABS: ORAL_TABLET | ORAL | 0 refills | Status: DC

## 2018-04-06 MED ORDER — PREDNISONE 10 MG PO TABS: ORAL_TABLET | ORAL | 0 refills | Status: DC

## 2018-04-06 MED ORDER — HYDROCOD POLST-CPM POLST ER 10-8 MG/5ML PO SUER: 5.0000 mL | Freq: Two times a day (BID) | ORAL | 0 refills | Status: DC | PRN

## 2018-04-06 NOTE — Assessment & Plan Note (Signed)
Acute.  Script for Zpack, Prednisone taper, and Tussionex sent.  [place], have recommended if worsening symptoms to be seen immediately at closest ER.

## 2018-04-06 NOTE — Progress Notes (Signed)
BP 103/65 (BP Location: Left Arm, Patient Position: Sitting, Cuff Size: Normal)   Pulse 75   Temp 97.7 F (36.5 C) (Oral)   Ht 5' 4.5" (1.638 m)   Wt 155 lb 12.8 oz (70.7 kg)   SpO2 96%   BMI 26.33 kg/m    Subjective:    Patient ID: Andrea Savage, female    DOB: [birth year], 73 y.o.   MRN: [id number]  HPI: Andrea Savage is a 73 y.o. female presents for cough  Chief Complaint  Patient presents with  . Cough    Productive. Ongoing 4 days. Once patient starts coughing, Andrea Savage can't stop.    UPPER RESPIRATORY TRACT INFECTION Started on Sunday evening, both his husband and daughter had illness.  They got sick from a 40 1/73 year old in the home.  Andrea Savage reports Andrea Savage never "gets regular cold", it immediately goes "to this cough with egg-white foam in throat".  Reports it "does not go away by itself".  States "this is not the flu".  Reports this is similar to Andrea Savage last exacerbation in June 2019, when Andrea Savage was treated with Zpack, Prednisone, and Tussionex which improved it.  Andrea Savage is about to travel [place] for one [place]. Worst symptom: cough Fever: Andrea Savage thinks Andrea Savage had one on Wednesday Cough: yes Shortness of breath: coughs so hard it is difficult to breath at times Wheezing: no Chest pain: reports more in belly and back, muscles with cough Chest tightness: yes Chest congestion: yes Nasal congestion: yes Runny nose: yes Post nasal drip: no Sneezing: no Sore throat: yes Swollen glands: no Sinus pressure: no Headache: no Face pain: no Toothache: no Ear pain: none Ear pressure: none Eyes red/itching:no Eye drainage/crusting: no  Vomiting: no Rash: no Fatigue: yes Sick contacts: yes Strep contacts: no  Context: worse Recurrent sinusitis: no Relief with OTC cold/cough medications: no  Treatments attempted: pure Rum and Elderberry  and mucinex , only helps for 3 hours and then wakes up in a fit of coughing and spitting up  Relevant past medical, surgical, family and social  history reviewed and updated as indicated. Interim medical history since our last visit reviewed. Allergies and medications reviewed and updated.  Review of Systems  Constitutional: Positive for fatigue. Negative for activity change, appetite change and fever.  HENT: Positive for congestion, postnasal drip, rhinorrhea and sore throat. Negative for ear discharge, ear pain, facial swelling, sinus pressure, sinus pain, sneezing and voice change.   Eyes: Negative for pain and visual disturbance.  Respiratory: Positive for cough and chest tightness. Negative for shortness of breath and wheezing.   Cardiovascular: Negative for chest pain, palpitations and leg swelling.  Gastrointestinal: Negative for abdominal distention, abdominal pain, constipation, diarrhea, nausea and vomiting.  Endocrine: Negative.   Musculoskeletal: Negative for myalgias.  Neurological: Negative for dizziness, numbness and headaches.  Psychiatric/Behavioral: Negative.     Per HPI unless specifically indicated above     Objective:    BP 103/65 (BP Location: Left Arm, Patient Position: Sitting, Cuff Size: Normal)   Pulse 75   Temp 97.7 F (36.5 C) (Oral)   Ht 5' 4.5" (1.638 m)   Wt 155 lb 12.8 oz (70.7 kg)   SpO2 96%   BMI 26.33 kg/m   Wt Readings from Last 3 Encounters:  04/06/18 155 lb 12.8 oz (70.7 kg)  11/30/17 155 lb 12.8 oz (70.7 kg)  11/29/17 155 lb 8 oz (70.5 kg)    Physical Exam Vitals signs and nursing note reviewed.  Constitutional:      General: Andrea Savage is awake.     Appearance: Andrea Savage is well-developed. Andrea Savage is ill-appearing.  HENT:     Head: Normocephalic.     Right Ear: Hearing, ear canal and external ear normal. No drainage. A middle ear effusion is present.     Left Ear: Hearing, ear canal and external ear normal. No drainage. A middle ear effusion is present.     Nose: Mucosal edema and rhinorrhea present. Rhinorrhea is clear.     Right Sinus: No maxillary sinus tenderness or frontal sinus  tenderness.     Left Sinus: No maxillary sinus tenderness or frontal sinus tenderness.     Mouth/Throat:     Mouth: Mucous membranes are moist.     Pharynx: Oropharynx is clear. Posterior oropharyngeal erythema (mild with cobblestoning) present. No pharyngeal swelling or oropharyngeal exudate.     Tonsils: Swelling: 0 on the right. 0 on the left.  Eyes:     General: Lids are normal.        Right eye: No discharge.        Left eye: No discharge.     Conjunctiva/sclera: Conjunctivae normal.     Pupils: Pupils are equal, round, and reactive to light.  Neck:     Musculoskeletal: Normal range of motion and neck supple.     Thyroid: No thyromegaly.     Vascular: No carotid bruit or JVD.  Cardiovascular:     Rate and Rhythm: Normal rate and regular rhythm.     Heart sounds: Normal heart sounds. No murmur. No gallop.   Pulmonary:     Effort: Pulmonary effort is normal.     Breath sounds: Examination of the right-upper field reveals wheezing. Examination of the left-upper field reveals wheezing. Examination of the right-middle field reveals wheezing. Wheezing present.     Comments: Expiratory wheezes upper and mid with slightly diminished bases. Abdominal:     General: Bowel sounds are normal.     Palpations: Abdomen is soft. There is no hepatomegaly or splenomegaly.  Musculoskeletal:     Right lower leg: No edema.     Left lower leg: No edema.  Lymphadenopathy:     Cervical: No cervical adenopathy.  Skin:    General: Skin is warm and dry.  Neurological:     Mental Status: Andrea Savage is alert and oriented to person, place, and time.  Psychiatric:        Attention and Perception: Attention normal.        Mood and Affect: Mood normal.        Behavior: Behavior normal. Behavior is cooperative.        Thought Content: Thought content normal.        Judgment: Judgment normal.     Results for orders placed or performed during the hospital encounter of 09/21/17  Surgical pathology  Result  Value Ref Range   SURGICAL PATHOLOGY      Surgical Pathology CASE: [id number] PATIENT: Gilda Crease Surgical Pathology Report     SPECIMEN SUBMITTED: A. Soft tissue, space between 3rd and 4th metatarsals B. Chondral defects, left foot, 2nd and 3rd metatarsal heads  CLINICAL HISTORY: None provided  PRE-OPERATIVE DIAGNOSIS: Morton's neuroma-left foot, capsulitis of MTPJ-left foot  POST-OPERATIVE DIAGNOSIS: Same as pre-op     DIAGNOSIS: A. SOFT TISSUE, BETWEEN THIRD AND FOURTH METATARSALS; EXCISION: - MORTON'S NEUROMA.  B. CHONDRAL DEFECTS, LEFT SECOND AND THIRD METATARSAL HEADS; REMOVAL: - TWO FRAGMENTS OF HYALINE CARTILAGE AND ONE FRAGMENT CONSISTENT WITH  A LOOSE BODY.   GROSS DESCRIPTION: A. Labeled: Left foot neuroma third and fourth space Received: In formalin Tissue fragment(s): 1 Size: 2.7 x 0.7 x 0.2 cm Description: Yellow to tan fibrous and fatty fragment, marked blue Entirely submitted in one cassette.  B. Labeled: Left foot second and third toe chondral defect Received:  In formalin Tissue fragment(s): 2 Size: Aggregate, 1.0 x 0.4 x 0.1 cm Description: Shiny rubbery tan to yellow fragments of tissue Entirely submitted in one cassette.    Final Diagnosis performed by Bryan Lemma, MD.   Electronically signed 09/25/2017 5:20:12PM The electronic signature indicates that the named Attending Pathologist has evaluated the specimen  Technical component performed at [facility], [address] [place], [place] [address] Lab: [phone] Dir: Rush Farmer, MD, MMM  Professional component performed at [facility], [facility], [place], [place], [place] [address] Lab: [phone] Dir: Dellia Nims. Reuel Derby, MD       Assessment & Plan:   Problem List Items Addressed This Visit      Respiratory   Asthma exacerbation    Acute.  Script for Zpack, Prednisone taper, and Tussionex sent.  Is traveling overseas for one month, have  recommended if worsening symptoms to be seen immediately at closest ER.        Relevant Medications   predniSONE (DELTASONE) 10 MG tablet       Follow up plan: Return if symptoms worsen or fail to improve.

## 2018-04-06 NOTE — Patient Instructions (Signed)

## 2018-04-11 DIAGNOSIS — M7752 Other enthesopathy of left foot: Secondary | ICD-10-CM | POA: Diagnosis not present

## 2018-04-11 DIAGNOSIS — M79672 Pain in left foot: Secondary | ICD-10-CM | POA: Diagnosis not present

## 2018-04-23 ENCOUNTER — Other Ambulatory Visit: Payer: Self-pay | Admitting: Family Medicine

## 2018-04-24 ENCOUNTER — Telehealth: Payer: Self-pay | Admitting: Nurse Practitioner

## 2018-04-24 NOTE — Telephone Encounter (Signed)
Noted  

## 2018-04-24 NOTE — Telephone Encounter (Signed)
Copied from [place] #[id number]. Topic: Appointment Scheduling - Scheduling Inquiry for Clinic >> Apr 24, 2018  2:25 PM Rutherford Nail, [place] wrote: Reason for CRM: Patient calling and states that she is needing an appointment with Dr [name]. States that she is "just not feeling well" and cannot get out of it. Says that she feels overwhelmed and cannot get out of this thing she is in. Says that she is dealing with a lot right now and thinks she needs to Dr [name] to help her. Denied thoughts of hurting herself or others. Dr Durenda Age first available appointment is not until Monday 05/07/2018. Please advise.  CB#: [phone] >> Apr 24, 2018  4:40 PM Stark Klein wrote: Pt has an appt tomorrow at 4pm.

## 2018-04-24 NOTE — Telephone Encounter (Signed)
Error

## 2018-04-25 ENCOUNTER — Ambulatory Visit (INDEPENDENT_AMBULATORY_CARE_PROVIDER_SITE_OTHER): Payer: PPO | Admitting: Nurse Practitioner

## 2018-04-25 ENCOUNTER — Encounter: Payer: Self-pay | Admitting: Nurse Practitioner

## 2018-04-25 ENCOUNTER — Other Ambulatory Visit: Payer: Self-pay

## 2018-04-25 DIAGNOSIS — F3341 Major depressive disorder, recurrent, in partial remission: Secondary | ICD-10-CM | POA: Diagnosis not present

## 2018-04-25 MED ORDER — BUSPIRONE HCL 5 MG PO TABS: 5.0000 mg | ORAL_TABLET | Freq: Two times a day (BID) | ORAL | 3 refills | Status: DC

## 2018-04-25 NOTE — Progress Notes (Signed)
BP 128/85   Pulse 72   Temp 98.3 F (36.8 C) (Oral)   Ht 5' 4.5" (1.638 m)   Wt 156 lb (70.8 kg)   SpO2 96%   BMI 26.36 kg/m    Subjective:    Patient ID: Andrea Savage, female    DOB: [birth year], 73 y.o.   MRN: [id number]  HPI: Andrea Savage is a 73 y.o. female  Chief Complaint  Patient presents with  . Depression    x about 2 months. pt states that she has a lot going on. states that feels like she needs some help   DEPRESSION She reports "life is just heavy".  Has anxiety and is mourning over her hearing loss.  Currently on Celexa 40 MG daily, has been taking this for most of her adult life.  Has been on 40 MG for three years now.  Has difficult relationship with her daughter, who has nerve damage in both hands and multiple other chronic conditions.  Due to this strain, they assist her financially which causes patient's family financial difficulty.  Feels that her anxiety is worse than depression.  States that her husband takes Klonopin and she does not want to take anything like that.  We discussed at length Buspar + risks/benefits of medication.  She would like to trial this along with her Citalopram. Mood status: exacerbated Satisfied with current treatment?: is okay with Citalopram Symptom severity: moderate  Duration of current treatment : chronic Side effects: no Medication compliance: excellent compliance Psychotherapy/counseling: none Previous psychiatric medications: Citalopram Depressed mood: yes Anxious mood: yes Anhedonia: no Significant weight loss or gain: no Insomnia: none Fatigue: yes Feelings of worthlessness or guilt: yes Impaired concentration/indecisiveness: yes Suicidal ideations: no Hopelessness: yes Crying spells: no Depression screen [facility] 2/9 04/25/2018 11/30/2017 09/11/2017 11/30/2016 11/12/2015  Decreased Interest 3 0 0 0 0  Down, Depressed, Hopeless 2 0 0 0 0  PHQ - 2 Score 5 0 0 0 0  Altered sleeping 2 - 0 - -  Tired, decreased  energy 3 - - - -  Change in appetite 3 - - - -  Feeling bad or failure about yourself  0 - 0 - -  Trouble concentrating 2 - 0 - -  Moving slowly or fidgety/restless 0 - - - -  Suicidal thoughts 0 - 0 - -  PHQ-9 Score 15 - 0 - -  Difficult doing work/chores Somewhat difficult - Somewhat difficult - -    Relevant past medical, surgical, family and social history reviewed and updated as indicated. Interim medical history since our last visit reviewed. Allergies and medications reviewed and updated.  Review of Systems  Constitutional: Negative for activity change, appetite change, diaphoresis, fatigue and fever.  Respiratory: Negative for cough, chest tightness and shortness of breath.   Cardiovascular: Negative for chest pain, palpitations and leg swelling.  Gastrointestinal: Negative for abdominal distention, abdominal pain, constipation, diarrhea, nausea and vomiting.  Endocrine: Negative for cold intolerance, heat intolerance, polydipsia, polyphagia and polyuria.  Neurological: Negative for dizziness, syncope, weakness, light-headedness, numbness and headaches.  Psychiatric/Behavioral: Positive for decreased concentration. Negative for self-injury, sleep disturbance and suicidal ideas. The patient is nervous/anxious.     Per HPI unless specifically indicated above     Objective:    BP 128/85   Pulse 72   Temp 98.3 F (36.8 C) (Oral)   Ht 5' 4.5" (1.638 m)   Wt 156 lb (70.8 kg)   SpO2 96%   BMI 26.36 kg/m  Wt Readings from Last 3 Encounters:  04/25/18 156 lb (70.8 kg)  04/06/18 155 lb 12.8 oz (70.7 kg)  11/30/17 155 lb 12.8 oz (70.7 kg)    Physical Exam Vitals signs and nursing note reviewed.  Constitutional:      Appearance: She is well-developed.  HENT:     Head: Normocephalic.  Eyes:     General:        Right eye: No discharge.        Left eye: No discharge.     Conjunctiva/sclera: Conjunctivae normal.     Pupils: Pupils are equal, round, and reactive to light.   Neck:     Musculoskeletal: Normal range of motion and neck supple.     Thyroid: No thyromegaly.     Vascular: No carotid bruit or JVD.  Cardiovascular:     Rate and Rhythm: Normal rate and regular rhythm.     Heart sounds: Normal heart sounds.  Pulmonary:     Effort: Pulmonary effort is normal.     Breath sounds: Normal breath sounds.  Abdominal:     General: Bowel sounds are normal.     Palpations: Abdomen is soft.  Lymphadenopathy:     Cervical: No cervical adenopathy.  Skin:    General: Skin is warm and dry.  Neurological:     Mental Status: She is alert and oriented to person, place, and time.  Psychiatric:        Mood and Affect: Mood normal.        Behavior: Behavior normal.        Thought Content: Thought content normal.        Judgment: Judgment normal.     Results for orders placed or performed during the hospital encounter of 09/21/17  Surgical pathology  Result Value Ref Range   SURGICAL PATHOLOGY      Surgical Pathology CASE: [id number] PATIENT: Gilda Crease Surgical Pathology Report     SPECIMEN SUBMITTED: A. Soft tissue, space between 3rd and 4th metatarsals B. Chondral defects, left foot, 2nd and 3rd metatarsal heads  CLINICAL HISTORY: None provided  PRE-OPERATIVE DIAGNOSIS: Morton's neuroma-left foot, capsulitis of MTPJ-left foot  POST-OPERATIVE DIAGNOSIS: Same as pre-op     DIAGNOSIS: A. SOFT TISSUE, BETWEEN THIRD AND FOURTH METATARSALS; EXCISION: - MORTON'S NEUROMA.  B. CHONDRAL DEFECTS, LEFT SECOND AND THIRD METATARSAL HEADS; REMOVAL: - TWO FRAGMENTS OF HYALINE CARTILAGE AND ONE FRAGMENT CONSISTENT WITH A LOOSE BODY.   GROSS DESCRIPTION: A. Labeled: Left foot neuroma third and fourth space Received: In formalin Tissue fragment(s): 1 Size: 2.7 x 0.7 x 0.2 cm Description: Yellow to tan fibrous and fatty fragment, marked blue Entirely submitted in one cassette.  B. Labeled: Left foot second and third toe chondral  defect Received:  In formalin Tissue fragment(s): 2 Size: Aggregate, 1.0 x 0.4 x 0.1 cm Description: Shiny rubbery tan to yellow fragments of tissue Entirely submitted in one cassette.    Final Diagnosis performed by Bryan Lemma, MD.   Electronically signed 09/25/2017 5:20:12PM The electronic signature indicates that the named Attending Pathologist has evaluated the specimen  Technical component performed at [facility], [address], [place], [place] [address] Lab: [phone] Dir: Rush Farmer, MD, MMM  Professional component performed at [facility], [facility], [place], [place], [place] [address] Lab: [phone] Dir: Dellia Nims. Reuel Derby, MD       Assessment & Plan:   Problem List Items Addressed This Visit      Other   Depression  Chronic, ongoing.  Continue Celexa, would like to trial Buspar in conjunction with Celexa.  Will start low dose and monitor.  Buspar script sent.  Follow-up in 4 weeks.  Recommend referral to therapy, which would benefit patient.  She wishes to think about this and will discuss more next visit.      Relevant Medications   busPIRone (BUSPAR) 5 MG tablet       Follow up plan: Return in about 4 weeks (around 05/23/2018) for For mood with PCP.

## 2018-04-25 NOTE — Patient Instructions (Signed)
Mindfulness-Based Stress Reduction Mindfulness-based stress reduction (MBSR) is a program that helps people learn to practice mindfulness. Mindfulness is the practice of intentionally paying attention to the present moment. It can be learned and practiced through techniques such as education, breathing exercises, meditation, and yoga. MBSR includes several mindfulness techniques in one program. MBSR works best when you understand the treatment, are willing to try new things, and can commit to spending time practicing what you learn. MBSR training may include learning about:  How your emotions, thoughts, and reactions affect your body.  New ways to respond to things that cause negative thoughts to start (triggers).  How to notice your thoughts and let go of them.  Practicing awareness of everyday things that you normally do without thinking.  The techniques and goals of different types of meditation. What are the benefits of MBSR? MBSR can have many benefits, which include helping you to:  Develop self-awareness. This refers to knowing and understanding yourself.  Learn skills and attitudes that help you to participate in your own health care.  Learn new ways to care for yourself.  Be more accepting about how things are, and let things go.  Be less judgmental and approach things with an open mind.  Be patient with yourself and trust yourself more. MBSR has also been shown to:  Reduce negative emotions, such as depression and anxiety.  Improve memory and focus.  Change how you sense and approach pain.  Boost your body's ability to fight infections.  Help you connect better with other people.  Improve your sense of well-being. Follow these instructions at home:   Find a local in-person or online MBSR program.  Set aside some time regularly for mindfulness practice.  Find a mindfulness practice that works best for you. This may include one or more of the  following: ? Meditation. Meditation involves focusing your mind on a certain thought or activity. ? Breathing awareness exercises. These help you to stay present by focusing on your breath. ? Body scan. For this practice, you lie down and pay attention to each part of your body from head to toe. You can identify tension and soreness and intentionally relax parts of your body. ? Yoga. Yoga involves stretching and breathing, and it can improve your ability to move and be flexible. It can also provide an experience of testing your body's limits, which can help you release stress. ? Mindful eating. This way of eating involves focusing on the taste, texture, color, and smell of each bite of food. Because this slows down eating and helps you feel full sooner, it can be an important part of a weight-loss plan.  Find a podcast or recording that provides guidance for breathing awareness, body scan, or meditation exercises. You can listen to these any time when you have a free moment to rest without distractions.  Follow your treatment plan as told by your health care provider. This may include taking regular medicines and making changes to your diet or lifestyle as recommended. How to practice mindfulness To do a basic awareness exercise:  Find a comfortable place to sit.  Pay attention to the present moment. Observe your thoughts, feelings, and surroundings just as they are.  Avoid placing judgment on yourself, your feelings, or your surroundings. Make note of any judgment that comes up, and let it go.  Your mind may wander, and that is okay. Make note of when your thoughts drift, and return your attention to the present moment. To do   basic mindfulness meditation:  Find a comfortable place to sit. This may include a stable chair or a firm floor cushion. ? Sit upright with your back straight. Let your arms fall next to your side with your hands resting on your legs. ? If sitting in a chair, rest your  feet flat on the floor. ? If sitting on a cushion, cross your legs in front of you.  Keep your head in a neutral position with your chin dropped slightly. Relax your jaw and rest the tip of your tongue on the roof of your mouth. Drop your gaze to the floor. You can close your eyes if you like.  Breathe normally and pay attention to your breath. Feel the air moving in and out of your nose. Feel your belly expanding and relaxing with each breath.  Your mind may wander, and that is okay. Make note of when your thoughts drift, and return your attention to your breath.  Avoid placing judgment on yourself, your feelings, or your surroundings. Make note of any judgment or feelings that come up, let them go, and bring your attention back to your breath.  When you are ready, lift your gaze or open your eyes. Pay attention to how your body feels after the meditation. Where to find more information You can find more information about MBSR from:  Your health care provider.  Community-based meditation centers or programs.  Programs offered near you. Summary  Mindfulness-based stress reduction (MBSR) is a program that teaches you how to intentionally pay attention to the present moment. It is used with other treatments to help you cope better with daily stress, emotions, and pain.  MBSR focuses on developing self-awareness, which allows you to respond to life stress without judgment or negative emotions.  MBSR programs may involve learning different mindfulness practices, such as breathing exercises, meditation, yoga, body scan, or mindful eating. Find a mindfulness practice that works best for you, and set aside time for it on a regular basis. This information is not intended to replace advice given to you by your health care provider. Make sure you discuss any questions you have with your health care provider. Document Released: 06/30/2016 Document Revised: 06/30/2016 Document Reviewed:  06/30/2016 Elsevier Interactive Patient Education  2019 [place].   Generalized Anxiety Disorder, Adult Generalized anxiety disorder (GAD) is a mental health disorder. People with this condition constantly worry about everyday events. Unlike normal anxiety, worry related to GAD is not triggered by a specific event. These worries also do not fade or get better with time. GAD interferes with life functions, including relationships, work, and school. GAD can vary from mild to severe. People with severe GAD can have intense waves of anxiety with physical symptoms (panic attacks). What are the causes? The exact cause of GAD is not known. What increases the risk? This condition is more likely to develop in:  Women.  People who have a family history of anxiety disorders.  People who are very shy.  People who experience very stressful life events, such as the death of a loved one.  People who have a very stressful family environment. What are the signs or symptoms? People with GAD often worry excessively about many things in their lives, such as their health and family. They may also be overly concerned about:  Doing well at work.  Being on time.  Natural disasters.  Friendships. Physical symptoms of GAD include:  Fatigue.  Muscle tension or having muscle twitches.  Trembling or  feeling shaky.  Being easily startled.  Feeling like your heart is pounding or racing.  Feeling out of breath or like you cannot take a deep breath.  Having trouble falling asleep or staying asleep.  Sweating.  Nausea, diarrhea, or irritable bowel syndrome (IBS).  Headaches.  Trouble concentrating or remembering facts.  Restlessness.  Irritability. How is this diagnosed? Your health care provider can diagnose GAD based on your symptoms and medical history. You will also have a physical exam. The health care provider will ask specific questions about your symptoms, including how severe  they are, when they started, and if they come and go. Your health care provider may ask you about your use of alcohol or drugs, including prescription medicines. Your health care provider may refer you to a mental health specialist for further evaluation. Your health care provider will do a thorough examination and may perform additional tests to rule out other possible causes of your symptoms. To be diagnosed with GAD, a person must have anxiety that:  Is out of his or her control.  Affects several different aspects of his or her life, such as work and relationships.  Causes distress that makes him or her unable to take part in normal activities.  Includes at least three physical symptoms of GAD, such as restlessness, fatigue, trouble concentrating, irritability, muscle tension, or sleep problems. Before your health care provider can confirm a diagnosis of GAD, these symptoms must be present more days than they are not, and they must last for six months or longer. How is this treated? The following therapies are usually used to treat GAD:  Medicine. Antidepressant medicine is usually prescribed for long-term daily control. Antianxiety medicines may be added in severe cases, especially when panic attacks occur.  Talk therapy (psychotherapy). Certain types of talk therapy can be helpful in treating GAD by providing support, education, and guidance. Options include: ? Cognitive behavioral therapy (CBT). People learn coping skills and techniques to ease their anxiety. They learn to identify unrealistic or negative thoughts and behaviors and to replace them with positive ones. ? Acceptance and commitment therapy (ACT). This treatment teaches people how to be mindful as a way to cope with unwanted thoughts and feelings. ? Biofeedback. This process trains you to manage your body's response (physiological response) through breathing techniques and relaxation methods. You will work with a therapist while  machines are used to monitor your physical symptoms.  Stress management techniques. These include yoga, meditation, and exercise. A mental health specialist can help determine which treatment is best for you. Some people see improvement with one type of therapy. However, other people require a combination of therapies. Follow these instructions at home:  Take over-the-counter and prescription medicines only as told by your health care provider.  Try to maintain a normal routine.  Try to anticipate stressful situations and allow extra time to manage them.  Practice any stress management or self-calming techniques as taught by your health care provider.  Do not punish yourself for setbacks or for not making progress.  Try to recognize your accomplishments, even if they are small.  Keep all follow-up visits as told by your health care provider. This is important. Contact a health care provider if:  Your symptoms do not get better.  Your symptoms get worse.  You have signs of depression, such as: ? A persistently sad, cranky, or irritable mood. ? Loss of enjoyment in activities that used to bring you joy. ? Change in weight or eating. ?  Changes in sleeping habits. ? Avoiding friends or family members. ? Loss of energy for normal tasks. ? Feelings of guilt or worthlessness. Get help right away if:  You have serious thoughts about hurting yourself or others. If you ever feel like you may hurt yourself or others, or have thoughts about taking your own life, get help right away. You can go to your nearest emergency department or call:  Your local emergency services (911 in the [place]  A suicide crisis helpline, such as the [facility] at [phone]. This is open 24 hours a day. Summary  Generalized anxiety disorder (GAD) is a mental health disorder that involves worry that is not triggered by a specific event.  People with GAD often worry excessively  about many things in their lives, such as their health and family.  GAD may cause physical symptoms such as restlessness, trouble concentrating, sleep problems, frequent sweating, nausea, diarrhea, headaches, and trembling or muscle twitching.  A mental health specialist can help determine which treatment is best for you. Some people see improvement with one type of therapy. However, other people require a combination of therapies. This information is not intended to replace advice given to you by your health care provider. Make sure you discuss any questions you have with your health care provider. Document Released: 06/18/2012 Document Revised: 01/12/2016 Document Reviewed: 01/12/2016 Elsevier Interactive Patient Education  2019 [employer].

## 2018-04-25 NOTE — Assessment & Plan Note (Signed)
Chronic, ongoing.  Continue Celexa, would like to trial Buspar in conjunction with Celexa.  Will start low dose and monitor.  Buspar script sent.  Follow-up in 4 weeks.  Recommend referral to therapy, which would benefit patient.  She wishes to think about this and will discuss more next visit.

## 2018-04-30 DIAGNOSIS — H903 Sensorineural hearing loss, bilateral: Secondary | ICD-10-CM | POA: Diagnosis not present

## 2018-05-07 ENCOUNTER — Telehealth: Payer: Self-pay | Admitting: Internal Medicine

## 2018-05-07 DIAGNOSIS — J45991 Cough variant asthma: Secondary | ICD-10-CM | POA: Diagnosis not present

## 2018-05-07 DIAGNOSIS — R05 Cough: Secondary | ICD-10-CM | POA: Diagnosis not present

## 2018-05-07 NOTE — Telephone Encounter (Signed)
Had allergy test 6 months ago and the tests came back negative. However, she wants to do more allergy testing with the shots to make sure.  Needs to be off metoprolol so that if she has a reaction to one of the agents then the metoprolol might inhibit the epinephrine from working. If she remains on the metoprolol, then she has to sign a waiver, "saying if I die, I will not sue them."  SHe is leaving for [place] at the end of this month and will be gone for 1 month. Advised I will check with provider and let her know if that's ok.  Offered to go ahead and schedule appointment but patient will call when she gets back.

## 2018-05-07 NOTE — Telephone Encounter (Signed)
Patient takes low dose Toprol XL 12.5 mg daily for paroxysmal SVT. Ok to hold Toprol at the direction of allergist with recommendation to resume when safely felt possible by allergist based on testing/findings.

## 2018-05-07 NOTE — Telephone Encounter (Signed)
Spoke with the patient and made her aware of Andrea Faith, PA's response. Patient verbalized understanding and voiced appreciation for the call.

## 2018-05-07 NOTE — Telephone Encounter (Signed)
Pt needs to have a allergy test and asks if it is ok she stop her Metoprolol 2 days before. Please call and advise.

## 2018-05-08 ENCOUNTER — Other Ambulatory Visit: Payer: Self-pay | Admitting: Unknown Physician Specialty

## 2018-05-08 DIAGNOSIS — R1312 Dysphagia, oropharyngeal phase: Secondary | ICD-10-CM

## 2018-05-15 DIAGNOSIS — J301 Allergic rhinitis due to pollen: Secondary | ICD-10-CM | POA: Diagnosis not present

## 2018-05-16 DIAGNOSIS — M7752 Other enthesopathy of left foot: Secondary | ICD-10-CM | POA: Diagnosis not present

## 2018-05-16 DIAGNOSIS — Z9889 Other specified postprocedural states: Secondary | ICD-10-CM | POA: Diagnosis not present

## 2018-05-18 ENCOUNTER — Encounter: Payer: Self-pay | Admitting: Family Medicine

## 2018-05-18 DIAGNOSIS — L821 Other seborrheic keratosis: Secondary | ICD-10-CM | POA: Diagnosis not present

## 2018-05-18 DIAGNOSIS — L820 Inflamed seborrheic keratosis: Secondary | ICD-10-CM | POA: Diagnosis not present

## 2018-05-18 DIAGNOSIS — L578 Other skin changes due to chronic exposure to nonionizing radiation: Secondary | ICD-10-CM | POA: Diagnosis not present

## 2018-05-18 DIAGNOSIS — L812 Freckles: Secondary | ICD-10-CM | POA: Diagnosis not present

## 2018-05-18 NOTE — Telephone Encounter (Signed)
FYI Jolene- Appointment has been cancelled.

## 2018-05-22 ENCOUNTER — Ambulatory Visit: Payer: BC Managed Care – PPO | Admitting: Nurse Practitioner

## 2018-05-23 DIAGNOSIS — J301 Allergic rhinitis due to pollen: Secondary | ICD-10-CM | POA: Diagnosis not present

## 2018-06-01 ENCOUNTER — Ambulatory Visit: Payer: Medicare Other

## 2018-06-06 ENCOUNTER — Telehealth: Payer: Self-pay | Admitting: Internal Medicine

## 2018-06-06 NOTE — Telephone Encounter (Signed)
Virtual Visit Pre-Appointment Phone Call  Steps For Call:  1. Confirm consent - "In the setting of the current Covid19 crisis, you are scheduled for a (phone or video) visit with your provider on (date) at (time).  Just as we do with many in-office visits, in order for you to participate in this visit, we must obtain consent.  If you'd like, I can send this to your mychart (if signed up) or email for you to review.  Otherwise, I can obtain your verbal consent now.  All virtual visits are billed to your insurance company just like a normal visit would be.  By agreeing to a virtual visit, we'd like you to understand that the technology does not allow for your provider to perform an examination, and thus may limit your provider's ability to fully assess your condition.  Finally, though the technology is pretty good, we cannot assure that it will always work on either your or our end, and in the setting of a video visit, we may have to convert it to a phone-only visit.  In either situation, we cannot ensure that we have a secure connection.  Are you willing to proceed?"  2. Give patient instructions for WebEx download to smartphone as below if video visit  3. Advise patient to be prepared with any vital sign or heart rhythm information, their current medicines, and a piece of paper and pen handy for any instructions they may receive the day of their visit  4. Inform patient they will receive a phone call 15 minutes prior to their appointment time (may be from unknown caller ID) so they should be prepared to answer  5. Confirm that appointment type is correct in Epic appointment notes (video vs telephone)    TELEPHONE CALL NOTE  Andrea Savage has been deemed a candidate for a follow-up tele-health visit to limit community exposure during the Covid-19 pandemic. I spoke with the patient via phone to ensure availability of phone/video source, confirm preferred email & phone number, and discuss  instructions and expectations.  I reminded Andrea Savage to be prepared with any vital sign and/or heart rhythm information that could potentially be obtained via home monitoring, at the time of her visit. I reminded Andrea Savage to expect a phone call at the time of her visit if her visit.  Did the patient verbally acknowledge consent to treatment? yes  Ace Gins 06/06/2018 2:52 PM   DOWNLOADING THE [place], go to [employer] and type in WebEx in the search bar. [place] [employer], the blue/green circle. The app is free but as with any other app downloads, their phone may require them to verify saved payment information or Apple password. The patient does NOT have to create an account.  - If Android, ask patient to go to [employer] and type in WebEx in the search bar. [place] [employer], the blue/green circle. The app is free but as with any other app downloads, their phone may require them to verify saved payment information or Android password. The patient does NOT have to create an account.   CONSENT FOR TELE-HEALTH VISIT - PLEASE REVIEW  I hereby voluntarily request, consent and authorize [facility] and its employed or contracted physicians, physician assistants, nurse practitioners or other licensed health care professionals (the Practitioner), to provide me with telemedicine health care services (the Services") as deemed necessary by the treating Practitioner. I acknowledge  and consent to receive the Services by the Practitioner via telemedicine. I understand that the telemedicine visit will involve communicating with the Practitioner through live audiovisual communication technology and the disclosure of certain medical information by electronic transmission. I acknowledge that I have been given the opportunity to request an in-person assessment or other available alternative prior to the telemedicine visit and  am voluntarily participating in the telemedicine visit.  I understand that I have the right to withhold or withdraw my consent to the use of telemedicine in the course of my care at any time, without affecting my right to future care or treatment, and that the Practitioner or I may terminate the telemedicine visit at any time. I understand that I have the right to inspect all information obtained and/or recorded in the course of the telemedicine visit and may receive copies of available information for a reasonable fee.  I understand that some of the potential risks of receiving the Services via telemedicine include:   Delay or interruption in medical evaluation due to technological equipment failure or disruption;  Information transmitted may not be sufficient (e.g. poor resolution of images) to allow for appropriate medical decision making by the Practitioner; and/or   In rare instances, security protocols could fail, causing a breach of personal health information.  Furthermore, I acknowledge that it is my responsibility to provide information about my medical history, conditions and care that is complete and accurate to the best of my ability. I acknowledge that Practitioner's advice, recommendations, and/or decision may be based on factors not within their control, such as incomplete or inaccurate data provided by me or distortions of diagnostic images or specimens that may result from electronic transmissions. I understand that the practice of medicine is not an exact science and that Practitioner makes no warranties or guarantees regarding treatment outcomes. I acknowledge that I will receive a copy of this consent concurrently upon execution via email to the email address I last provided but may also request a printed copy by calling the office of [facility].    I understand that my insurance will be billed for this visit.   I have read or had this consent read to me.  I understand the  contents of this consent, which adequately explains the benefits and risks of the Services being provided via telemedicine.   I have been provided ample opportunity to ask questions regarding this consent and the Services and have had my questions answered to my satisfaction.  I give my informed consent for the services to be provided through the use of telemedicine in my medical care  By participating in this telemedicine visit I agree to the above.

## 2018-06-12 NOTE — Progress Notes (Signed)
Virtual Visit via Video Note   This visit type was conducted due to national recommendations for restrictions regarding the COVID-19 Pandemic (e.g. social distancing) in an effort to limit this patient's exposure and mitigate transmission in our community.  Due to her co-morbid illnesses, this patient is at least at moderate risk for complications without adequate follow up.  This format is felt to be most appropriate for this patient at this time.  All issues noted in this document were discussed and addressed.  A limited physical exam was performed with this format.  Verbal consent was obtained from the patient.  Evaluation Performed:  Follow-up visit  Date:  06/13/2018   ID:  Andrea, Savage 1946-02-28, MRN [id number]  Patient Location: Home  Provider Location: Office  PCP:  Valerie Roys, DO  Cardiologist:  Nelva Bush, MD Electrophysiologist:  None   Chief Complaint:  Follow-up PSVT and syncope/presyncope  History of Present Illness:    Andrea Savage is a 73 y.o. female who presents via audio/video conferencing for a telehealth visit today.  She has a history of PSVT, recurrent syncope/near syncope, possible asthma, fibromyalgia, depression, and postherpetic neuralgia.  She was last seen in our office in 11/2017 by Christell Faith, PA.  At that time, she was doing well without any further syncopal or presyncopal episodes.  No medication changes were made at that time.  She was still awaiting scheduling of her MRI/MRA to exclude underlying neurologic process (it does not appear that these were ever done).  Today, Andrea Savage reports doing very well.  She accidentally missed a couple of doses of metoprolol a few weeks ago and noted vague lightheadedness and chest discomfort/palpitations while off the metoprolol.  Symptoms stopped promptly after resuming metoprolol succinate 12.5 mg daily.  She otherwise denies chest pain, shortness of breath, palpitations, lightheadedness,  and edema.  She is exercising regularly on her treadmill and is following COVID-19 precautions.  A month-long trip to [place] was cancelled due to COVID-19.  She recently adjusted the timing of albuterol and QVAR, which has significantly improved her cough.  The patient does not have symptoms concerning for COVID-19 infection (fever, chills, cough, or new shortness of breath).    Past Medical History:  Diagnosis Date   Asthma    Depression    Fibromyalgia    GERD (gastroesophageal reflux disease)    H/O scarlet fever    as child   Heart murmur    s/p scarlet fever as child   IBS (irritable bowel syndrome)    Low bone density    Osteopenia    Reflux    Rosacea    Wears hearing aid in both ears    Past Surgical History:  Procedure Laterality Date   ABDOMINAL HYSTERECTOMY  2009   Total   BREAST EXCISIONAL BIOPSY Left 1979   NEG   COLONOSCOPY  [id number]   repeat 10 years   EXCISION MORTON'S NEUROMA Left 09/21/2017   Procedure: EXCISION MORTON'S NEUROMA;  Surgeon: Albertine Patricia, DPM;  Location: [facility];  Service: Podiatry;  Laterality: Left;   FOOT SURGERY Right 1990   HAND SURGERY Right    LAPAROSCOPIC OOPHERECTOMY     TONSILLECTOMY     WEIL OSTEOTOMY Left 09/21/2017   Procedure: WEIL OSTEOTOMY-2ND & 3RD TOES;  Surgeon: Albertine Patricia, DPM;  Location: [facility];  Service: Podiatry;  Laterality: Left;  LMA WITH LOCAL MINI MONSTER DR TROXLER WANTS TED HOSE ON PATIENT'S RIGHT LEG  Current Meds  Medication Sig   albuterol (PROVENTIL HFA;VENTOLIN HFA) 108 (90 Base) MCG/ACT inhaler Inhale 2 puffs into the lungs every 6 (six) hours as needed for wheezing or shortness of breath.   beclomethasone (QVAR) 40 MCG/ACT inhaler Inhale 2 puffs into the lungs 2 (two) times daily.   benzonatate (TESSALON) 200 MG capsule Take 1 capsule (200 mg total) by mouth 2 (two) times daily as needed for cough.   busPIRone (BUSPAR) 5 MG tablet Take 1 tablet  (5 mg total) by mouth 2 (two) times daily. (Patient taking differently: Take 5 mg by mouth daily. )   citalopram (CELEXA) 40 MG tablet Take 1 tablet (20 mg total) by mouth daily.   Dextromethorphan-guaiFENesin (Wilson DM PO) Take by mouth as needed.    diphenhydrAMINE (BENADRYL) 25 MG tablet Take 25 mg by mouth daily.   Magnesium 250 MG TABS Take by mouth daily.   metoprolol succinate (TOPROL-XL) 25 MG 24 hr tablet Take 0.5 tablets (12.5 mg total) by mouth daily.   Misc Natural Products (TART CHERRY ADVANCED PO) Take by mouth daily.   montelukast (SINGULAIR) 10 MG tablet Take 10 mg by mouth at bedtime.   NON FORMULARY acetyl l carnitine po 1 tablet daily.   pantoprazole (PROTONIX) 40 MG tablet Take 40 mg by mouth daily.   ranitidine (ZANTAC) 300 MG tablet Take 300 mg by mouth at bedtime.    TURMERIC PO Take by mouth daily.     Allergies:   Compazine [prochlorperazine] and Dust mite extract   Social History   Tobacco Use   Smoking status: Never Smoker   Smokeless tobacco: Never Used  Substance Use Topics   Alcohol use: Yes    Alcohol/week: 1.0 standard drinks    Types: 1 Glasses of wine per week    Comment: on occasion   Drug use: No     Family Hx: The patient's family history includes Asthma in her father; Breast cancer (age of onset: 5) in her mother; Cancer in her father and mother; Diabetes in her sister; Heart disease in her father; Kidney disease in her brother; Liver disease in her sister; Thyroid disease in her brother and sister.  ROS:   Please see the history of present illness.   All other systems reviewed and are negative.   Prior CV studies:   The following studies were reviewed today:  Pharmacologic MPI (09/18/2017): Low risk study without ischemia or scar.  LVEF greater than 65%.  Labs/Other Tests and Data Reviewed:    EKG:  No ECG reviewed.  Recent Labs: 09/11/2017: ALT 17; BUN 10; Creatinine, Ser 0.78; Hemoglobin 14.6; Platelets 219;  Potassium 4.0; Sodium 137; TSH 1.640   Recent Lipid Panel Lab Results  Component Value Date/Time   CHOL 221 (H) 09/11/2017 02:52 PM   TRIG 242 (H) 09/11/2017 02:52 PM   HDL 58 09/11/2017 02:52 PM   LDLCALC 115 (H) 09/11/2017 02:52 PM    Wt Readings from Last 3 Encounters:  06/13/18 155 lb (70.3 kg)  04/25/18 156 lb (70.8 kg)  04/06/18 155 lb 12.8 oz (70.7 kg)     Objective:    Vital Signs:  Pulse 66    Ht 5\' 5"  (1.651 m)    Wt 155 lb (70.3 kg)    BMI 25.79 kg/m    Well nourished, well developed female in no acute distress.   ASSESSMENT & PLAN:    PSVT: Brief recurrence of symptoms (chest pain/palpitations and lightheadedness) occurred after accidentally missing a couple  of doses of metoprolol.  While on metoprolol, symptoms have been well-controlled.  No medication changes or further testing at this time.  COVID-19 Education: The signs and symptoms of COVID-19 were discussed with the patient and how to seek care for testing (follow up with PCP or arrange E-visit).  The importance of social distancing was discussed today.  Time:   Today, I have spent 12 minutes with the patient with telehealth technology discussing the above problems.     Medication Adjustments/Labs and Tests Ordered: Current medicines are reviewed at length with the patient today.  Concerns regarding medicines are outlined above.   Tests Ordered: None.  Medication Changes: None.  Disposition:  Follow up in 1 year(s)  Signed, Nelva Bush, MD  06/13/2018 11:18 AM    [place]

## 2018-06-13 ENCOUNTER — Telehealth (INDEPENDENT_AMBULATORY_CARE_PROVIDER_SITE_OTHER): Payer: PPO | Admitting: Internal Medicine

## 2018-06-13 ENCOUNTER — Other Ambulatory Visit: Payer: Self-pay

## 2018-06-13 ENCOUNTER — Encounter: Payer: Self-pay | Admitting: Internal Medicine

## 2018-06-13 VITALS — HR 66 | Ht 65.0 in | Wt 155.0 lb

## 2018-06-13 DIAGNOSIS — I471 Supraventricular tachycardia, unspecified: Secondary | ICD-10-CM | POA: Insufficient documentation

## 2018-06-13 MED ORDER — METOPROLOL SUCCINATE ER 25 MG PO TB24: 12.5000 mg | ORAL_TABLET | Freq: Every day | ORAL | 3 refills | Status: DC

## 2018-06-13 NOTE — Patient Instructions (Signed)
Medication Instructions:  Your physician recommends that you continue on your current medications as directed. Please refer to the Current Medication list given to you today.  If you need a refill on your cardiac medications before your next appointment, please call your pharmacy.   Lab work: none If you have labs (blood work) drawn today and your tests are completely normal, you will receive your results only by: Marland Kitchen MyChart Message (if you have MyChart) OR . A paper copy in the mail If you have any lab test that is abnormal or we need to change your treatment, we will call you to review the results.  Testing/Procedures: none  Follow-Up: At [facility], you and your health needs are our priority.  As part of our continuing mission to provide you with exceptional heart care, we have created designated Provider Care Teams.  These Care Teams include your primary Cardiologist (physician) and Advanced Practice Providers (APPs -  Physician Assistants and Nurse Practitioners) who all work together to provide you with the care you need, when you need it. You will need a follow up appointment in 12 months.  Please call our office 2 months in advance to schedule this appointment.  You may see DR Harrell Gave END or one of the following Advanced Practice Providers on your designated Care Team:   Murray Hodgkins, NP Christell Faith, PA-C . Marrianne Mood, PA-C

## 2018-08-03 ENCOUNTER — Telehealth: Payer: Self-pay

## 2018-08-03 ENCOUNTER — Other Ambulatory Visit: Payer: Self-pay | Admitting: Nurse Practitioner

## 2018-08-03 MED ORDER — BUSPIRONE HCL 5 MG PO TABS: 5.0000 mg | ORAL_TABLET | Freq: Every day | ORAL | 2 refills | Status: DC

## 2018-08-03 NOTE — Telephone Encounter (Signed)
Patient would like a 30 day supply of buspione 5mg 

## 2018-08-03 NOTE — Progress Notes (Signed)
Buspirone refill sent

## 2018-08-03 NOTE — Telephone Encounter (Signed)
Done

## 2018-08-06 ENCOUNTER — Encounter: Payer: Self-pay | Admitting: Family Medicine

## 2018-08-08 NOTE — Telephone Encounter (Signed)
Spoke with pt and she stated that she thought she was suppose to be taking 2 pills a day. She has only been taking one a day. She stated that she is doing fine with just the one pill and will call back to schedule an appt at the appropriate time.

## 2018-08-27 DIAGNOSIS — M7752 Other enthesopathy of left foot: Secondary | ICD-10-CM | POA: Diagnosis not present

## 2018-08-27 DIAGNOSIS — G5782 Other specified mononeuropathies of left lower limb: Secondary | ICD-10-CM | POA: Diagnosis not present

## 2018-09-17 DIAGNOSIS — M7752 Other enthesopathy of left foot: Secondary | ICD-10-CM | POA: Diagnosis not present

## 2018-09-17 DIAGNOSIS — M79672 Pain in left foot: Secondary | ICD-10-CM | POA: Diagnosis not present

## 2018-10-08 DIAGNOSIS — M79672 Pain in left foot: Secondary | ICD-10-CM | POA: Diagnosis not present

## 2018-10-08 DIAGNOSIS — T849XXD Unspecified complication of internal orthopedic prosthetic device, implant and graft, subsequent encounter: Secondary | ICD-10-CM | POA: Diagnosis not present

## 2018-10-08 DIAGNOSIS — M7752 Other enthesopathy of left foot: Secondary | ICD-10-CM | POA: Diagnosis not present

## 2018-10-22 ENCOUNTER — Other Ambulatory Visit: Payer: Self-pay | Admitting: Nurse Practitioner

## 2018-10-26 DIAGNOSIS — J301 Allergic rhinitis due to pollen: Secondary | ICD-10-CM | POA: Diagnosis not present

## 2018-10-29 DIAGNOSIS — J301 Allergic rhinitis due to pollen: Secondary | ICD-10-CM | POA: Diagnosis not present

## 2018-10-31 ENCOUNTER — Encounter: Payer: Self-pay | Admitting: Podiatry

## 2018-10-31 ENCOUNTER — Ambulatory Visit (INDEPENDENT_AMBULATORY_CARE_PROVIDER_SITE_OTHER): Payer: PPO | Admitting: Podiatry

## 2018-10-31 ENCOUNTER — Other Ambulatory Visit: Payer: Self-pay

## 2018-10-31 ENCOUNTER — Ambulatory Visit (INDEPENDENT_AMBULATORY_CARE_PROVIDER_SITE_OTHER): Payer: PPO

## 2018-10-31 VITALS — BP 120/83 | HR 70 | Resp 16

## 2018-10-31 DIAGNOSIS — M779 Enthesopathy, unspecified: Secondary | ICD-10-CM | POA: Diagnosis not present

## 2018-10-31 DIAGNOSIS — D3613 Benign neoplasm of peripheral nerves and autonomic nervous system of lower limb, including hip: Secondary | ICD-10-CM

## 2018-10-31 DIAGNOSIS — M778 Other enthesopathies, not elsewhere classified: Secondary | ICD-10-CM

## 2018-10-31 NOTE — Progress Notes (Signed)
Subjective:  Patient ID: Andrea Savage, female    DOB: 16-Aug-1945,  MRN: [id number] HPI Chief Complaint  Patient presents with  . Foot Pain    Forefoot left - surgery by Dr. Elvina Mattes 09-22-17, still having pain- 4th toe numb, tips of 2nd and 3rd toe very sore, some midfoot pain, looks bruised and swollen a lot, he has injected x 3, tried different shoes, sneakers make foot hurt  . New Patient (Initial Visit)    73 y.o. female presents with the above complaint.   ROS: She denies fever chills nausea vomiting muscle aches pains calf pain back pain chest pain shortness of breath.  Past Medical History:  Diagnosis Date  . Asthma   . Depression   . Fibromyalgia   . GERD (gastroesophageal reflux disease)   . H/O scarlet fever    as child  . Heart murmur    s/p scarlet fever as child  . IBS (irritable bowel syndrome)   . Low bone density   . Osteopenia   . Reflux   . Rosacea   . Wears hearing aid in both ears    Past Surgical History:  Procedure Laterality Date  . ABDOMINAL HYSTERECTOMY  2009   Total  . BREAST EXCISIONAL BIOPSY Left 1979   NEG  . COLONOSCOPY  [id number]   repeat 10 years  . EXCISION MORTON'S NEUROMA Left 09/21/2017   Procedure: EXCISION MORTON'S NEUROMA;  Surgeon: Albertine Patricia, DPM;  Location: [facility];  Service: Podiatry;  Laterality: Left;  . FOOT SURGERY Right 1990  . HAND SURGERY Right   . LAPAROSCOPIC OOPHERECTOMY    . TONSILLECTOMY    . WEIL OSTEOTOMY Left 09/21/2017   Procedure: WEIL OSTEOTOMY-2ND & 3RD TOES;  Surgeon: Albertine Patricia, DPM;  Location: [facility];  Service: Podiatry;  Laterality: Left;  LMA WITH LOCAL MINI MONSTER DR TROXLER WANTS TED HOSE ON PATIENT'S RIGHT LEG    Current Outpatient Medications:  .  albuterol (PROVENTIL HFA;VENTOLIN HFA) 108 (90 Base) MCG/ACT inhaler, Inhale 2 puffs into the lungs every 6 (six) hours as needed for wheezing or shortness of breath., Disp: 1 Inhaler, Rfl: 6 .  beclomethasone  (QVAR) 40 MCG/ACT inhaler, Inhale 2 puffs into the lungs 2 (two) times daily., Disp: , Rfl:  .  benzonatate (TESSALON) 200 MG capsule, Take 1 capsule (200 mg total) by mouth 2 (two) times daily as needed for cough., Disp: 20 capsule, Rfl: 0 .  busPIRone (BUSPAR) 5 MG tablet, Take 1 tablet (5 mg total) by mouth daily., Disp: 30 tablet, Rfl: 1 .  citalopram (CELEXA) 40 MG tablet, Take 1 tablet (20 mg total) by mouth daily., Disp: 90 tablet, Rfl: 1 .  Dextromethorphan-guaiFENesin ([place] DM PO), Take by mouth as needed. , Disp: , Rfl:  .  diphenhydrAMINE (BENADRYL) 25 MG tablet, Take 25 mg by mouth daily., Disp: , Rfl:  .  Magnesium 250 MG TABS, Take by mouth daily., Disp: , Rfl:  .  metoprolol succinate (TOPROL-XL) 25 MG 24 hr tablet, Take 0.5 tablets (12.5 mg total) by mouth daily., Disp: 45 tablet, Rfl: 3 .  Misc Natural Products (TART CHERRY ADVANCED PO), Take by mouth daily., Disp: , Rfl:  .  montelukast (SINGULAIR) 10 MG tablet, Take 10 mg by mouth at bedtime., Disp: , Rfl:  .  NON FORMULARY, acetyl l carnitine po 1 tablet daily., Disp: , Rfl:  .  omeprazole (PRILOSEC) 40 MG capsule, , Disp: , Rfl:  .  pantoprazole (PROTONIX) 40 MG  tablet, Take 40 mg by mouth daily., Disp: , Rfl:  .  ranitidine (ZANTAC) 300 MG tablet, Take 300 mg by mouth at bedtime. , Disp: , Rfl:  .  TURMERIC PO, Take by mouth daily., Disp: , Rfl:   Allergies  Allergen Reactions  . Compazine [Prochlorperazine] Other (See Comments)    Grand mal seizure  . Dust Mite Extract    Review of Systems Objective:   Vitals:   10/31/18 1115  BP: 120/83  Pulse: 70  Resp: 16    General: Well developed, nourished, in no acute distress, alert and oriented x3   Dermatological: Skin is warm, dry and supple bilateral. Nails x 10 are well maintained; remaining integument appears unremarkable at this time. There are no open sores, no preulcerative lesions, no rash or signs of infection present.  Vascular: Dorsalis Pedis artery  and Posterior Tibial artery pedal pulses are 2/4 bilateral with immedate capillary fill time. Pedal hair growth present. No varicosities and no lower extremity edema present bilateral.   Neruologic: Grossly intact via light touch bilateral. Vibratory intact via tuning fork bilateral. Protective threshold with Semmes Wienstein monofilament intact to all pedal sites bilateral. Patellar and Achilles deep tendon reflexes 2+ bilateral. No Babinski or clonus noted bilateral.   Musculoskeletal: No gross boney pedal deformities bilateral. No pain, crepitus, or limitation noted with foot and ankle range of motion bilateral. Muscular strength 5/5 in all groups tested bilateral.  She has pain on palpation to the third metatarsal phalangeal joint with flexible hammertoe deformity third left.  She also has some tenderness at the point where a neuroma was excised.  I feel that the majority of her symptoms are associated with pain to the third met due to the hammertoe possible internal fixation but lateral compensation is the majority of her issue particularly with that lateralmost incision and the pain in that area.  Gait: Unassisted, Nonantalgic.    Radiographs:  Radiographs taken today demonstrate no acute findings mild hammertoe deformity third left.  Second and third metatarsal osteotomies with double screw fixation which is gone on to heal uneventfully.  Assessment & Plan:   Assessment: Metatarsalgia third left capsulitis hammertoe deformity painful internal fixation.  Plan: Referred her back to Dr. Elvina Mattes for surgical intervention.     Andrea Savage, [place]

## 2018-11-01 DIAGNOSIS — J301 Allergic rhinitis due to pollen: Secondary | ICD-10-CM | POA: Diagnosis not present

## 2018-11-05 DIAGNOSIS — J301 Allergic rhinitis due to pollen: Secondary | ICD-10-CM | POA: Diagnosis not present

## 2018-11-08 DIAGNOSIS — J301 Allergic rhinitis due to pollen: Secondary | ICD-10-CM | POA: Diagnosis not present

## 2018-11-09 DIAGNOSIS — J301 Allergic rhinitis due to pollen: Secondary | ICD-10-CM | POA: Diagnosis not present

## 2018-11-15 ENCOUNTER — Encounter: Payer: Self-pay | Admitting: *Deleted

## 2018-11-15 ENCOUNTER — Other Ambulatory Visit: Payer: Self-pay

## 2018-11-15 DIAGNOSIS — J301 Allergic rhinitis due to pollen: Secondary | ICD-10-CM | POA: Diagnosis not present

## 2018-11-19 ENCOUNTER — Other Ambulatory Visit: Payer: Self-pay

## 2018-11-19 ENCOUNTER — Other Ambulatory Visit: Payer: Self-pay | Admitting: Podiatry

## 2018-11-19 ENCOUNTER — Other Ambulatory Visit
Admission: RE | Admit: 2018-11-19 | Discharge: 2018-11-19 | Disposition: A | Discharge status: Home / Self Care | Payer: PPO | Source: Ambulatory Visit | Attending: Podiatry | Admitting: Podiatry

## 2018-11-19 DIAGNOSIS — T849XXD Unspecified complication of internal orthopedic prosthetic device, implant and graft, subsequent encounter: Secondary | ICD-10-CM | POA: Diagnosis not present

## 2018-11-19 DIAGNOSIS — M7752 Other enthesopathy of left foot: Secondary | ICD-10-CM | POA: Insufficient documentation

## 2018-11-19 DIAGNOSIS — M79672 Pain in left foot: Secondary | ICD-10-CM | POA: Insufficient documentation

## 2018-11-19 DIAGNOSIS — Z20828 Contact with and (suspected) exposure to other viral communicable diseases: Secondary | ICD-10-CM | POA: Diagnosis not present

## 2018-11-19 DIAGNOSIS — Z01812 Encounter for preprocedural laboratory examination: Secondary | ICD-10-CM | POA: Diagnosis not present

## 2018-11-19 LAB — SARS CORONAVIRUS 2 (TAT 6-24 HRS): SARS Coronavirus 2: NEGATIVE

## 2018-11-22 ENCOUNTER — Encounter
Admission: RE | Disposition: A | Discharge status: Home / Self Care | Payer: Self-pay | Source: Home / Self Care | Attending: Podiatry

## 2018-11-22 ENCOUNTER — Ambulatory Visit
Admission: RE | Admit: 2018-11-22 | Discharge: 2018-11-22 | Disposition: A | Discharge status: Home / Self Care | Payer: PPO | Attending: Podiatry | Admitting: Podiatry

## 2018-11-22 ENCOUNTER — Ambulatory Visit: Payer: PPO | Admitting: Anesthesiology

## 2018-11-22 DIAGNOSIS — Z7951 Long term (current) use of inhaled steroids: Secondary | ICD-10-CM | POA: Insufficient documentation

## 2018-11-22 DIAGNOSIS — D3613 Benign neoplasm of peripheral nerves and autonomic nervous system of lower limb, including hip: Secondary | ICD-10-CM | POA: Diagnosis not present

## 2018-11-22 DIAGNOSIS — J45909 Unspecified asthma, uncomplicated: Secondary | ICD-10-CM | POA: Insufficient documentation

## 2018-11-22 DIAGNOSIS — T849XXD Unspecified complication of internal orthopedic prosthetic device, implant and graft, subsequent encounter: Secondary | ICD-10-CM | POA: Diagnosis not present

## 2018-11-22 DIAGNOSIS — M797 Fibromyalgia: Secondary | ICD-10-CM | POA: Diagnosis not present

## 2018-11-22 DIAGNOSIS — M79672 Pain in left foot: Secondary | ICD-10-CM | POA: Diagnosis not present

## 2018-11-22 DIAGNOSIS — M24575 Contracture, left foot: Secondary | ICD-10-CM | POA: Diagnosis not present

## 2018-11-22 DIAGNOSIS — G8929 Other chronic pain: Secondary | ICD-10-CM | POA: Diagnosis not present

## 2018-11-22 DIAGNOSIS — Z79899 Other long term (current) drug therapy: Secondary | ICD-10-CM | POA: Insufficient documentation

## 2018-11-22 DIAGNOSIS — F329 Major depressive disorder, single episode, unspecified: Secondary | ICD-10-CM | POA: Insufficient documentation

## 2018-11-22 DIAGNOSIS — M109 Gout, unspecified: Secondary | ICD-10-CM | POA: Diagnosis not present

## 2018-11-22 DIAGNOSIS — M7752 Other enthesopathy of left foot: Secondary | ICD-10-CM | POA: Diagnosis not present

## 2018-11-22 DIAGNOSIS — G8928 Other chronic postprocedural pain: Secondary | ICD-10-CM | POA: Diagnosis not present

## 2018-11-22 DIAGNOSIS — L905 Scar conditions and fibrosis of skin: Secondary | ICD-10-CM | POA: Diagnosis not present

## 2018-11-22 DIAGNOSIS — K219 Gastro-esophageal reflux disease without esophagitis: Secondary | ICD-10-CM | POA: Diagnosis not present

## 2018-11-22 DIAGNOSIS — M67874 Other specified disorders of tendon, left ankle and foot: Secondary | ICD-10-CM | POA: Insufficient documentation

## 2018-11-22 DIAGNOSIS — T8484XA Pain due to internal orthopedic prosthetic devices, implants and grafts, initial encounter: Secondary | ICD-10-CM | POA: Diagnosis not present

## 2018-11-22 HISTORY — PX: MINOR HARDWARE REMOVAL: SHX6474

## 2018-11-22 SURGERY — MINOR HARDWARE REMOVAL
Anesthesia: General | Site: Foot | Laterality: Left

## 2018-11-22 MED ORDER — ACETAMINOPHEN 325 MG PO TABS: 325.0000 mg | ORAL_TABLET | ORAL | Status: DC | PRN

## 2018-11-22 MED ORDER — BUPIVACAINE LIPOSOME 1.3 % IJ SUSP
INTRAMUSCULAR | Status: DC | PRN
  Administered 2018-11-22: 4.5 mL
  Administered 2018-11-22: 9 mL

## 2018-11-22 MED ORDER — OXYCODONE HCL 5 MG PO TABS: 5.0000 mg | ORAL_TABLET | Freq: Once | ORAL | Status: DC | PRN

## 2018-11-22 MED ORDER — EPHEDRINE SULFATE 50 MG/ML IJ SOLN
INTRAMUSCULAR | Status: DC | PRN
  Administered 2018-11-22: 5 mg via INTRAVENOUS
  Administered 2018-11-22 (×2): 10 mg via INTRAVENOUS

## 2018-11-22 MED ORDER — ONDANSETRON HCL 4 MG/2ML IJ SOLN
INTRAMUSCULAR | Status: DC | PRN
  Administered 2018-11-22: 4 mg via INTRAVENOUS

## 2018-11-22 MED ORDER — FENTANYL CITRATE (PF) 100 MCG/2ML IJ SOLN: INTRAMUSCULAR | Status: DC | PRN

## 2018-11-22 MED ORDER — LIDOCAINE HCL (CARDIAC) PF 100 MG/5ML IV SOSY
PREFILLED_SYRINGE | INTRAVENOUS | Status: DC | PRN
  Administered 2018-11-22: 30 mg via INTRATRACHEAL

## 2018-11-22 MED ORDER — CEFAZOLIN SODIUM-DEXTROSE 2-4 GM/100ML-% IV SOLN
2.0000 g | INTRAVENOUS | Status: AC
  Administered 2018-11-22: 2 g via INTRAVENOUS

## 2018-11-22 MED ORDER — DEXAMETHASONE SODIUM PHOSPHATE 4 MG/ML IJ SOLN
INTRAMUSCULAR | Status: DC | PRN
  Administered 2018-11-22: 4 mg via INTRAVENOUS

## 2018-11-22 MED ORDER — BUPIVACAINE HCL 0.25 % IJ SOLN
INTRAMUSCULAR | Status: DC | PRN
  Administered 2018-11-22: 4.5 mL

## 2018-11-22 MED ORDER — OXYCODONE HCL 5 MG/5ML PO SOLN: 5.0000 mg | Freq: Once | ORAL | Status: DC | PRN

## 2018-11-22 MED ORDER — HYDROCODONE-ACETAMINOPHEN 5-325 MG PO TABS: 1.0000 | ORAL_TABLET | Freq: Four times a day (QID) | ORAL | 0 refills | Status: DC | PRN

## 2018-11-22 MED ORDER — FENTANYL CITRATE (PF) 100 MCG/2ML IJ SOLN
INTRAMUSCULAR | Status: DC | PRN
  Administered 2018-11-22 (×2): 25 ug via INTRAVENOUS

## 2018-11-22 MED ORDER — MIDAZOLAM HCL 5 MG/5ML IJ SOLN
INTRAMUSCULAR | Status: DC | PRN
  Administered 2018-11-22 (×2): 1 mg via INTRAVENOUS

## 2018-11-22 MED ORDER — FENTANYL CITRATE (PF) 100 MCG/2ML IJ SOLN: 25.0000 ug | INTRAMUSCULAR | Status: DC | PRN

## 2018-11-22 MED ORDER — DEXAMETHASONE SODIUM PHOSPHATE 4 MG/ML IJ SOLN
INTRAMUSCULAR | Status: DC | PRN
  Administered 2018-11-22: 4 mg

## 2018-11-22 MED ORDER — LACTATED RINGERS IV SOLN
INTRAVENOUS | Status: DC
  Administered 2018-11-22: 06:00:00 via INTRAVENOUS

## 2018-11-22 MED ORDER — ACETAMINOPHEN 160 MG/5ML PO SOLN: 325.0000 mg | ORAL | Status: DC | PRN

## 2018-11-22 MED ORDER — PROPOFOL 10 MG/ML IV BOLUS
INTRAVENOUS | Status: DC | PRN
  Administered 2018-11-22: 120 mg via INTRAVENOUS

## 2018-11-22 MED ORDER — POVIDONE-IODINE 7.5 % EX SOLN
Freq: Once | CUTANEOUS | Status: AC
  Administered 2018-11-22: 07:00:00 via TOPICAL

## 2018-11-22 MED ORDER — ONDANSETRON HCL 4 MG/2ML IJ SOLN: 4.0000 mg | Freq: Once | INTRAMUSCULAR | Status: DC | PRN

## 2018-11-22 SURGICAL SUPPLY — 28 items
BENZOIN TINCTURE PRP APPL 2/3 (GAUZE/BANDAGES/DRESSINGS) ×2 IMPLANT
BLADE SURG 15 STRL LF DISP TIS (BLADE) IMPLANT
BLADE SURG 15 STRL SS (BLADE) ×1
BNDG ESMARK 4X12 TAN STRL LF (GAUZE/BANDAGES/DRESSINGS) ×2 IMPLANT
BNDG GAUZE 4.5X4.1 6PLY STRL (MISCELLANEOUS) ×1 IMPLANT
BNDG STRETCH 4X75 STRL LF (GAUZE/BANDAGES/DRESSINGS) ×2 IMPLANT
COVER LIGHT HANDLE UNIVERSAL (MISCELLANEOUS) ×4 IMPLANT
CUFF TOURN SGL QUICK 18X4 (TOURNIQUET CUFF) ×1 IMPLANT
DRAPE FLUOR MINI C-ARM 54X84 (DRAPES) ×2 IMPLANT
ELECT REM PT RETURN 9FT ADLT (ELECTROSURGICAL) ×2
ELECTRODE REM PT RTRN 9FT ADLT (ELECTROSURGICAL) IMPLANT
GAUZE SPONGE 4X4 12PLY STRL (GAUZE/BANDAGES/DRESSINGS) ×2 IMPLANT
GAUZE XEROFORM 1X8 LF (GAUZE/BANDAGES/DRESSINGS) ×2 IMPLANT
GOWN STRL REUS W/ TWL LRG LVL3 (GOWN DISPOSABLE) ×1 IMPLANT
GOWN STRL REUS W/ TWL XL LVL3 (GOWN DISPOSABLE) ×1 IMPLANT
GOWN STRL REUS W/TWL LRG LVL3 (GOWN DISPOSABLE) ×1
GOWN STRL REUS W/TWL XL LVL3 (GOWN DISPOSABLE) ×1
KIT TURNOVER KIT A (KITS) ×2 IMPLANT
NDL HYPO 25GX1X1/2 BEV (NEEDLE) ×1 IMPLANT
NEEDLE HYPO 25GX1X1/2 BEV (NEEDLE) ×2 IMPLANT
NS IRRIG 500ML POUR BTL (IV SOLUTION) ×2 IMPLANT
PACK EXTREMITY ARMC (MISCELLANEOUS) ×2 IMPLANT
PENCIL SMOKE EVACUATOR (MISCELLANEOUS) ×2 IMPLANT
STOCKINETTE STRL 6IN 960660 (GAUZE/BANDAGES/DRESSINGS) ×2 IMPLANT
STRIP CLOSURE SKIN 1/4X4 (GAUZE/BANDAGES/DRESSINGS) ×2 IMPLANT
SUT VIC AB 4-0 FS2 27 (SUTURE) ×3 IMPLANT
SYR 10ML LL (SYRINGE) ×2 IMPLANT
SYR 3ML LL SCALE MARK (SYRINGE) ×1 IMPLANT

## 2018-11-22 NOTE — Anesthesia Preprocedure Evaluation (Signed)
Anesthesia Evaluation  Patient identified by MRN, date of birth, ID band Patient awake    Reviewed: Allergy & Precautions, H&P , NPO status , Patient's Chart, lab work & pertinent test results  Airway Mallampati: II  TM Distance: >3 FB Neck ROM: full    Dental no notable dental hx.    Pulmonary asthma ,    Pulmonary exam normal breath sounds clear to auscultation       Cardiovascular Supra Ventricular Tachycardia  Rhythm:regular Rate:Normal     Neuro/Psych PSYCHIATRIC DISORDERS Depression    GI/Hepatic GERD  ,  Endo/Other    Renal/GU      Musculoskeletal   Abdominal   Peds  Hematology   Anesthesia Other Findings   Reproductive/Obstetrics                             Anesthesia Physical Anesthesia Plan  ASA: II  Anesthesia Plan: General LMA   Post-op Pain Management:    Induction:   PONV Risk Score and Plan: 3 and Dexamethasone and Ondansetron  Airway Management Planned:   Additional Equipment:   Intra-op Plan:   Post-operative Plan:   Informed Consent: I have reviewed the patients History and Physical, chart, labs and discussed the procedure including the risks, benefits and alternatives for the proposed anesthesia with the patient or authorized representative who has indicated his/her understanding and acceptance.       Plan Discussed with: CRNA  Anesthesia Plan Comments:         Anesthesia Quick Evaluation

## 2018-11-22 NOTE — Anesthesia Procedure Notes (Signed)
Procedure Name: LMA Insertion Date/Time: 11/22/2018 7:13 AM Performed by: Cameron Ali, CRNA Pre-anesthesia Checklist: Patient identified, Emergency Drugs available, Suction available, Timeout performed and Patient being monitored Patient Re-evaluated:Patient Re-evaluated prior to induction Oxygen Delivery Method: Circle system utilized Preoxygenation: Pre-oxygenation with 100% oxygen Induction Type: IV induction LMA: LMA inserted LMA Size: 3.0 Number of attempts: 1 Placement Confirmation: positive ETCO2 and breath sounds checked- equal and bilateral Tube secured with: Tape Dental Injury: Teeth and Oropharynx as per pre-operative assessment

## 2018-11-22 NOTE — Transfer of Care (Signed)
Immediate Anesthesia Transfer of Care Note  Patient: Andrea Savage  Procedure(s) Performed: REMOVAL SCREW;DEEP 2nd and 3RD LEFT CAPSULE RELEASE OF THIRD METATARSAL PHALANGEAL JOINT LEFT, TENDON LENGTHING THIRD TOE LEFT AND CORTISONE INJECTION LEFT (Left Foot)  Patient Location: PACU  Anesthesia Type: General LMA  Level of Consciousness: awake, alert  and patient cooperative  Airway and Oxygen Therapy: Patient Spontanous Breathing and Patient connected to supplemental oxygen  Post-op Assessment: Post-op Vital signs reviewed, Patient's Cardiovascular Status Stable, Respiratory Function Stable, Patent Airway and No signs of Nausea or vomiting  Post-op Vital Signs: Reviewed and stable  Complications: No apparent anesthesia complications

## 2018-11-22 NOTE — Op Note (Signed)
Operative note   Surgeon: Dr. Albertine Patricia, DPM.    Assistant: None    Preop diagnosis: 1.  Orthopedic hardware complication (painful screws second third metatarsals) left foot 2.  Scar contracture third metatarsal phalangeal joint left foot with dorsal contracture of the digit 3.  Tight extensor tendon third toe left foot    Postop diagnosis: Same but also included a dorsal cutaneous nerve neuroma    Procedure:   1.  Removal of screws from the second metatarsal left foot   2.  Removal of screws from third metatarsal left foot   3.  Capsulotomy third metatarsal phalangeal joints left foot 4.  Extensor tendon lengthening with Z-plasty technique third toe left foot  5.  Removal of dorsal cutaneous nerve neuroma   EBL: LESS than 5 cc    Anesthesia:general delivered by the anesthesia team.  Preoperatively I injected her with a combination of 0.25% Marcaine mixed with Exparel and postoperatively I injected straight Exparel to the region.  Also used 4 mg of dexamethasone around the third metatarsal phalangeal joint region.    Hemostasis: Ankle tourniquet 220 mils mercury pressure for 35 minutes    Specimen: Dorsal cutaneous nerve neuroma to the third toe region    Complications: None    Operative indications: Chronic pain for gout third metatarsal phalangeal joint.  She had previous surgery over a year ago for second third metatarsal osteotomies and neuroma excisions.  She progressed well with that but had some lingering pain.  Possibly due to hardware with the finding of the neuroma that may be a contributing to this.  Also there was a dorsal contracture of the MTP joint and scar tissue and scarring down of the extensor tendon.    Procedure:  Patient was brought into the OR and placed on the operating table in thesupine position. After anesthesia was obtained theleft lower extremity was prepped and draped in usual sterile fashion.  Operative Report: This time attention was directed to  the scar line overlying the lateral third metatarsal phalangeal joint region.  A 3 cm linear incision was made through the previous incision margin which was then deepened with sharp and blunt dissection.  The extensor tendon was identified and was seen to be scarred down dorsally.  This was freed medial laterally all the way to the proximal phalanx base.  The capsule was also released dorsally once this was accomplished of the toe was significantly freed and stability to plantar flex.  The tendon was then retracted medially and the capsule was incised longitudinally and 2 screw heads were noted.  These screws were then removed in toto.  There is an copiously irrigated then the periosteum over the screw areas was closed with 4-0 Vicryl simple interrupted sutures.  Capsulotomy area was left open.  Extensor tendon was evaluated and freed up so that it was no longer contracted or bound down to the underlying capsule.  Extensor tendon lengthening was then accomplished with a Z-plasty technique two 4-0 Vicryl simple erupted sutures were anchored on each end of the Z-plasty.  This point with loading of the foot the toe was seen to sit in a much better overall position.  No longer dorsiflexed to the level it was before.  This time the area was inspected and a apparent neuroma had developed and what appeared to be a dorsal cutaneous nerve and a relatively superficial area.  I traced this nerve proximally and remove the abnormal portion of the nerve.  This area was then  injected with dexamethasone 4 mg along with with putting some in the metatarsal phalangeal joint as well.  This point area was copiously irrigated and deep superficial fascia was closed with 4-0 Vicryl continuous stitch and skin was closed with 4-0 Vicryl in subcuticular fashion.  At this time attention directed the second metatarsal phalangeal joint where a 2.5 cm linear incision was made through the previous scar over that region.  This was deepened sharp  blunt dissection bleeders clamped and bovied as required.  The extensor tendon was identified freed medial laterally retracted laterally.  A linear incision made through the distal periosteal tissue over the distal shaft and head of the metatarsal.  2 screws were noted and removed at this timeframe.  There was an copiously irrigated periosteal tissue was closed with 2 simple erupted sutures.  Deep superficial fascial layers were closed with 4-0 Vicryl continuous stitch and skin closed with 4-0 Vicryl subcuticular fashion.  This time there was blocked with the Exparel and sterile compressive dressing was placed across the wounds consisting of Steri-Strips Xeroform gauze 4 x 4's Kling and Kerlix tourniquet was released and prompt complete vascularity seen return all digits of the left foot.  Patient tolerated procedure and anesthesia well.    Patient tolerated the procedure and anesthesia well.  Was transported from the OR to the PACU with all vital signs stable and vascular status intact. To be discharged per routine protocol.  Will follow up in approximately 1 week in the outpatient clinic.

## 2018-11-22 NOTE — Discharge Instructions (Signed)
No Name DR. [place]   1. Take your medication as prescribed.  Pain medication should be taken only as needed.  2. Keep the dressing clean, dry and intact.  3. Keep your foot elevated above the heart level for the first 48 hours.  4. Walking to the bathroom and brief periods of walking are acceptable, unless we have instructed you to be non-weight bearing.  5. Always wear your post-op shoe when walking.  Always use your crutches if you are to be non-weight bearing.  6. Do not take a shower. Baths are permissible as long as the foot is kept out of the water.   7. Every hour you are awake:  - Bend your knee 15 times. - Flex foot 15 times - Massage calf 15 times  8. Call [facility] [phone]) if any of the following problems occur: - You develop a temperature or fever. - The bandage becomes saturated with blood. - Medication does not stop your pain. - Injury of the foot occurs. - Any symptoms of infection including redness, odor, or red streaks running from wound.  Information for Discharge Teaching: EXPAREL (bupivacaine liposome injectable suspension)   Your surgeon or anesthesiologist gave you EXPAREL(bupivacaine) to help control your pain after surgery.   EXPAREL is a local anesthetic that provides pain relief by numbing the tissue around the surgical site.  EXPAREL is designed to release pain medication over time and can control pain for up to 72 hours.  Depending on how you respond to EXPAREL, you may require less pain medication during your recovery.  Possible side effects:  Temporary loss of sensation or ability to move in the area where bupivacaine was injected.  Nausea, vomiting, constipation  Rarely, numbness and tingling in your mouth or lips, lightheadedness, or anxiety may occur.  Call your doctor right away if you think  you may be experiencing any of these sensations, or if you have other questions regarding possible side effects.  Follow all other discharge instructions given to you by your surgeon or nurse. Eat a healthy diet and drink plenty of water or other fluids.  If you return to the hospital for any reason within 96 hours following the administration of EXPAREL, it is important for health care providers to know that you have received this anesthetic. A teal colored band has been placed on your arm with the date, time and amount of EXPAREL you have received in order to alert and inform your health care providers. Please leave this armband in place for the full 96 hours following administration, and then you may remove the band.  General Anesthesia, Adult, Care After This sheet gives you information about how to care for yourself after your procedure. Your health care provider may also give you more specific instructions. If you have problems or questions, contact your health care provider. What can I expect after the procedure? After the procedure, the following side effects are common:  Pain or discomfort at the IV site.  Nausea.  Vomiting.  Sore throat.  Trouble concentrating.  Feeling cold or chills.  Weak or tired.  Sleepiness and fatigue.  Soreness and body aches. These side effects can affect parts of the body that were not involved in surgery. Follow these instructions at home:  For at least 24 hours after the procedure:  Have a responsible adult stay with you. It is important to have someone  help care for you until you are awake and alert.  Rest as needed.  Do not: ? Participate in activities in which you could fall or become injured. ? Drive. ? Use heavy machinery. ? Drink alcohol. ? Take sleeping pills or medicines that cause drowsiness. ? Make important decisions or sign legal documents. ? Take care of children on your own. Eating and drinking  Follow any instructions  from your health care provider about eating or drinking restrictions.  When you feel hungry, start by eating small amounts of foods that are soft and easy to digest (bland), such as toast. Gradually return to your regular diet.  Drink enough fluid to keep your urine pale yellow.  If you vomit, rehydrate by drinking water, juice, or clear broth. General instructions  If you have sleep apnea, surgery and certain medicines can increase your risk for breathing problems. Follow instructions from your health care provider about wearing your sleep device: ? Anytime you are sleeping, including during daytime naps. ? While taking prescription pain medicines, sleeping medicines, or medicines that make you drowsy.  Return to your normal activities as told by your health care provider. Ask your health care provider what activities are safe for you.  Take over-the-counter and prescription medicines only as told by your health care provider.  If you smoke, do not smoke without supervision.  Keep all follow-up visits as told by your health care provider. This is important. Contact a health care provider if:  You have nausea or vomiting that does not get better with medicine.  You cannot eat or drink without vomiting.  You have pain that does not get better with medicine.  You are unable to pass urine.  You develop a skin rash.  You have a fever.  You have redness around your IV site that gets worse. Get help right away if:  You have difficulty breathing.  You have chest pain.  You have blood in your urine or stool, or you vomit blood. Summary  After the procedure, it is common to have a sore throat or nausea. It is also common to feel tired.  Have a responsible adult stay with you for the first 24 hours after general anesthesia. It is important to have someone help care for you until you are awake and alert.  When you feel hungry, start by eating small amounts of foods that are soft  and easy to digest (bland), such as toast. Gradually return to your regular diet.  Drink enough fluid to keep your urine pale yellow.  Return to your normal activities as told by your health care provider. Ask your health care provider what activities are safe for you. This information is not intended to replace advice given to you by your health care provider. Make sure you discuss any questions you have with your health care provider. Document Released: 05/30/2000 Document Revised: 02/24/2017 Document Reviewed: 10/07/2016 Elsevier Patient Education  2020 [employer].

## 2018-11-22 NOTE — H&P (Signed)
H and P has been reviewed and no changes are noted.  

## 2018-11-22 NOTE — Anesthesia Postprocedure Evaluation (Signed)
Anesthesia Post Note  Patient: MARLISA CAVAGNARO  Procedure(s) Performed: REMOVAL SCREW;DEEP 2nd and 3RD LEFT CAPSULE RELEASE OF THIRD METATARSAL PHALANGEAL JOINT LEFT, TENDON LENGTHING THIRD TOE LEFT AND CORTISONE INJECTION LEFT (Left Foot)  Patient location during evaluation: PACU Anesthesia Type: General Level of consciousness: awake and alert and oriented Pain management: satisfactory to patient Vital Signs Assessment: post-procedure vital signs reviewed and stable Respiratory status: spontaneous breathing, nonlabored ventilation and respiratory function stable Cardiovascular status: blood pressure returned to baseline and stable Postop Assessment: Adequate PO intake and No signs of nausea or vomiting Anesthetic complications: no    Raliegh Ip

## 2018-11-23 ENCOUNTER — Encounter: Payer: Self-pay | Admitting: Podiatry

## 2018-11-26 LAB — SURGICAL PATHOLOGY

## 2018-12-03 DIAGNOSIS — J301 Allergic rhinitis due to pollen: Secondary | ICD-10-CM | POA: Diagnosis not present

## 2018-12-06 DIAGNOSIS — J301 Allergic rhinitis due to pollen: Secondary | ICD-10-CM | POA: Diagnosis not present

## 2018-12-10 DIAGNOSIS — J301 Allergic rhinitis due to pollen: Secondary | ICD-10-CM | POA: Diagnosis not present

## 2018-12-12 ENCOUNTER — Telehealth: Payer: Self-pay

## 2018-12-12 NOTE — Telephone Encounter (Signed)
Called to schedule medicare annual wellness visit with NHA- Tiffany Hill,LPN at [facility]. Can schedule virtual or in office anytime.  Direct call back [phone]

## 2018-12-13 DIAGNOSIS — J301 Allergic rhinitis due to pollen: Secondary | ICD-10-CM | POA: Diagnosis not present

## 2018-12-17 DIAGNOSIS — J301 Allergic rhinitis due to pollen: Secondary | ICD-10-CM | POA: Diagnosis not present

## 2018-12-20 DIAGNOSIS — J301 Allergic rhinitis due to pollen: Secondary | ICD-10-CM | POA: Diagnosis not present

## 2018-12-24 DIAGNOSIS — J301 Allergic rhinitis due to pollen: Secondary | ICD-10-CM | POA: Diagnosis not present

## 2018-12-27 DIAGNOSIS — J301 Allergic rhinitis due to pollen: Secondary | ICD-10-CM | POA: Diagnosis not present

## 2018-12-31 DIAGNOSIS — J301 Allergic rhinitis due to pollen: Secondary | ICD-10-CM | POA: Diagnosis not present

## 2019-01-03 DIAGNOSIS — J301 Allergic rhinitis due to pollen: Secondary | ICD-10-CM | POA: Diagnosis not present

## 2019-01-07 DIAGNOSIS — J301 Allergic rhinitis due to pollen: Secondary | ICD-10-CM | POA: Diagnosis not present

## 2019-01-14 DIAGNOSIS — J301 Allergic rhinitis due to pollen: Secondary | ICD-10-CM | POA: Diagnosis not present

## 2019-01-17 ENCOUNTER — Other Ambulatory Visit: Payer: Self-pay | Admitting: Nurse Practitioner

## 2019-01-17 NOTE — Telephone Encounter (Signed)
Called pt to schedule, she is not wanting to come into the office. I offered her virtual appt she is going to check on doing virtual and call us back.

## 2019-01-17 NOTE — Telephone Encounter (Signed)
Requested medication (s) are due for refill today: yes  Requested medication (s) are on the active medication list: yes  Last refill:  12/21/2018  Future visit scheduled: no  Notes to clinic:  Review for refill Overdue for office visit    Requested Prescriptions  Pending Prescriptions Disp Refills   busPIRone (BUSPAR) 5 MG tablet [Pharmacy Med Name: BUSPIRONE HCL 5 MG TABLET] 30 tablet 0    Sig: Take 1 tablet (5 mg total) by mouth daily.     Psychiatry: Anxiolytics/Hypnotics - Non-controlled Failed - 01/17/2019 12:22 PM      Failed - Valid encounter within last 6 months    Recent Outpatient Visits          8 months ago Recurrent major depressive disorder, in partial remission ([place])   [place], Jolene T, NP   9 months ago Moderate persistent asthma with exacerbation   [place], Henrine Screws T, NP   1 year ago Near syncope   [place], DO   1 year ago Moderate asthma with exacerbation, unspecified whether persistent   [facility] Trinna Post, PA-C   1 year ago Pre-op evaluation   [facility] Carles Collet M, [place]

## 2019-01-17 NOTE — Telephone Encounter (Signed)
Needs follow up appointment.  

## 2019-01-17 NOTE — Telephone Encounter (Signed)
Routing to provider  

## 2019-01-18 ENCOUNTER — Encounter: Payer: Self-pay | Admitting: Family Medicine

## 2019-01-21 ENCOUNTER — Other Ambulatory Visit: Payer: Self-pay

## 2019-01-21 ENCOUNTER — Encounter: Payer: Self-pay | Admitting: Family Medicine

## 2019-01-21 ENCOUNTER — Ambulatory Visit (INDEPENDENT_AMBULATORY_CARE_PROVIDER_SITE_OTHER): Payer: BC Managed Care – PPO | Admitting: Family Medicine

## 2019-01-21 VITALS — BP 128/68 | HR 70 | Temp 98.2°F | Wt 147.0 lb

## 2019-01-21 DIAGNOSIS — F3341 Major depressive disorder, recurrent, in partial remission: Secondary | ICD-10-CM

## 2019-01-21 DIAGNOSIS — J301 Allergic rhinitis due to pollen: Secondary | ICD-10-CM | POA: Diagnosis not present

## 2019-01-21 MED ORDER — CITALOPRAM HYDROBROMIDE 40 MG PO TABS: ORAL_TABLET | ORAL | 1 refills | Status: DC

## 2019-01-21 MED ORDER — BUSPIRONE HCL 5 MG PO TABS: 5.0000 mg | ORAL_TABLET | Freq: Every day | ORAL | 1 refills | Status: DC

## 2019-01-21 NOTE — Progress Notes (Signed)
BP 128/68   Pulse 70   Temp 98.2 F (36.8 C)   Wt 147 lb (66.7 kg)   BMI 24.46 kg/m    Subjective:    Patient ID: Para March, female    DOB: 1945/06/04, 73 y.o.   MRN: [id number]  HPI: Andrea Savage is a 73 y.o. female  Chief Complaint  Patient presents with  . Anxiety   ANXIETY/STRESS Duration: chronic Status:stable Anxious mood: yes  Excessive worrying: yes Irritability: no  Sweating: no Nausea: no Palpitations:no Hyperventilation: no Panic attacks: no Agoraphobia: no  Obscessions/compulsions: no Depressed mood: yes Depression screen [facility] 2/9 01/21/2019 04/25/2018 11/30/2017 09/11/2017 11/30/2016  Decreased Interest 0 3 0 0 0  Down, Depressed, Hopeless 0 2 0 0 0  PHQ - 2 Score 0 5 0 0 0  Altered sleeping 0 2 - 0 -  Tired, decreased energy 0 3 - - -  Change in appetite 0 3 - - -  Feeling bad or failure about yourself  0 0 - 0 -  Trouble concentrating 0 2 - 0 -  Moving slowly or fidgety/restless 0 0 - - -  Suicidal thoughts 0 0 - 0 -  PHQ-9 Score 0 15 - 0 -  Difficult doing work/chores Not difficult at all Somewhat difficult - Somewhat difficult -   GAD 7 : Generalized Anxiety Score 01/21/2019  Nervous, Anxious, on Edge 0  Control/stop worrying 0  Worry too much - different things 0  Trouble relaxing 0  Restless 0  Easily annoyed or irritable 0  Afraid - awful might happen 0  Total GAD 7 Score 0  Anxiety Difficulty Not difficult at all   Anhedonia: no Weight changes: no Insomnia: no   Hypersomnia: no Fatigue/loss of energy: yes Feelings of worthlessness: yes Feelings of guilt: no Impaired concentration/indecisiveness: no Suicidal ideations: no  Crying spells: no Recent Stressors/Life Changes: yes   Relationship problems: no   Family stress: no     Financial stress: no    Job stress: no    Recent death/loss: no  Relevant past medical, surgical, family and social history reviewed and updated as indicated. Interim medical history since  our last visit reviewed. Allergies and medications reviewed and updated.  Review of Systems  Constitutional: Negative.   Respiratory: Negative.   Cardiovascular: Negative.   Musculoskeletal: Negative.   Skin: Negative.   Psychiatric/Behavioral: Positive for dysphoric mood. Negative for agitation, behavioral problems, confusion, decreased concentration, hallucinations, self-injury, sleep disturbance and suicidal ideas. The patient is nervous/anxious. The patient is not hyperactive.     Per HPI unless specifically indicated above     Objective:    BP 128/68   Pulse 70   Temp 98.2 F (36.8 C)   Wt 147 lb (66.7 kg)   BMI 24.46 kg/m   Wt Readings from Last 3 Encounters:  01/21/19 147 lb (66.7 kg)  11/22/18 139 lb (63 kg)  06/13/18 155 lb (70.3 kg)    Physical Exam Vitals signs and nursing note reviewed.  Constitutional:      General: She is not in acute distress.    Appearance: Normal appearance. She is not ill-appearing, toxic-appearing or diaphoretic.  HENT:     Head: Normocephalic and atraumatic.     Right Ear: External ear normal.     Left Ear: External ear normal.     Nose: Nose normal.     Mouth/Throat:     Mouth: Mucous membranes are moist.     Pharynx: Oropharynx  is clear.  Eyes:     General: No scleral icterus.       Right eye: No discharge.        Left eye: No discharge.     Conjunctiva/sclera: Conjunctivae normal.     Pupils: Pupils are equal, round, and reactive to light.  Neck:     Musculoskeletal: Normal range of motion.  Pulmonary:     Effort: Pulmonary effort is normal. No respiratory distress.     Comments: Speaking in full sentences Musculoskeletal: Normal range of motion.  Skin:    Coloration: Skin is not jaundiced or pale.     Findings: No bruising, erythema, lesion or rash.  Neurological:     Mental Status: She is alert and oriented to person, place, and time. Mental status is at baseline.  Psychiatric:        Mood and Affect: Mood normal.         Behavior: Behavior normal.        Thought Content: Thought content normal.        Judgment: Judgment normal.     Results for orders placed or performed during the hospital encounter of 11/22/18  Surgical pathology  Result Value Ref Range   SURGICAL PATHOLOGY      SURGICAL PATHOLOGY CASE: [id number] PATIENT: Andrea Savage Surgical Pathology Report     Specimen Submitted: A. Neuroma, small dorsal nerve, left foot  Clinical History: M77.52 metatarsophalangeal joint of left foot, M79.672 left foot pain, T84.9XXD complication of internal fixation device, subsequent encouter      DIAGNOSIS: A. SOFT TISSUE MASS, LEFT FOOT, DORSAL NERVE; EXCISION: - FINDINGS CONSISTENT WITH TRAUMATIC NEUROMA.   GROSS DESCRIPTION: A. Labeled: Dorsal nerve neuroma left Received: In formalin Tissue fragment(s): 1 Size: 1.3 x 0.4 x 0.2 cm Description: Fibrofatty soft tissue fragment. Entirely submitted in 1 cassette.   Final Diagnosis performed by Raynelle Bring, MD.   Electronically signed 11/26/2018 3:03:00PM The electronic signature indicates that the named Attending Pathologist has evaluated the specimen Technical component performed at [facility], [address] [place], [place] [address] Lab: [phone] Dir: Rush Farmer, MD, M MM  Professional component performed at [facility], [name], [place], [place], [place] [address] Lab: [phone] Dir: Dellia Nims. Reuel Derby, MD       Assessment & Plan:   Problem List Items Addressed This Visit      Other   Depression - Primary    Under good control on current regimen. Continue current regimen. Continue to monitor. Call with any concerns. Refills given. Continue PRN buspar.        Relevant Medications   busPIRone (BUSPAR) 5 MG tablet   citalopram (CELEXA) 40 MG tablet       Follow up plan: Return in about 6 months (around 07/21/2019) for Physical.   . This visit was completed via FaceTime due  to the restrictions of the COVID-19 pandemic. All issues as above were discussed and addressed. Physical exam was done as above through visual confirmation on FaceTime. If it was felt that the patient should be evaluated in the office, they were directed there. The patient verbally consented to this visit. . Location of the patient: home . Location of the provider: work . Those involved with this call:  . Provider: Park Liter, DO . CMA: Tiffany Reel, CMA . Front Desk/Registration: Don Perking  . Time spent on call: 15 minutes with patient face to face via video conference. More than 50% of this time was spent in  counseling and coordination of care. 23 minutes total spent in review of patient's record and preparation of their chart.

## 2019-01-21 NOTE — Assessment & Plan Note (Signed)
Under good control on current regimen. Continue current regimen. Continue to monitor. Call with any concerns. Refills given. Continue PRN buspar.

## 2019-01-28 DIAGNOSIS — J301 Allergic rhinitis due to pollen: Secondary | ICD-10-CM | POA: Diagnosis not present

## 2019-01-29 DIAGNOSIS — J301 Allergic rhinitis due to pollen: Secondary | ICD-10-CM | POA: Diagnosis not present

## 2019-02-04 DIAGNOSIS — J301 Allergic rhinitis due to pollen: Secondary | ICD-10-CM | POA: Diagnosis not present

## 2019-02-11 DIAGNOSIS — J301 Allergic rhinitis due to pollen: Secondary | ICD-10-CM | POA: Diagnosis not present

## 2019-02-18 DIAGNOSIS — J301 Allergic rhinitis due to pollen: Secondary | ICD-10-CM | POA: Diagnosis not present

## 2019-02-27 DIAGNOSIS — J301 Allergic rhinitis due to pollen: Secondary | ICD-10-CM | POA: Diagnosis not present

## 2019-03-13 DIAGNOSIS — J301 Allergic rhinitis due to pollen: Secondary | ICD-10-CM | POA: Diagnosis not present

## 2019-03-15 DIAGNOSIS — R21 Rash and other nonspecific skin eruption: Secondary | ICD-10-CM | POA: Diagnosis not present

## 2019-03-15 DIAGNOSIS — L309 Dermatitis, unspecified: Secondary | ICD-10-CM | POA: Diagnosis not present

## 2019-03-15 DIAGNOSIS — Z872 Personal history of diseases of the skin and subcutaneous tissue: Secondary | ICD-10-CM | POA: Diagnosis not present

## 2019-03-20 DIAGNOSIS — J301 Allergic rhinitis due to pollen: Secondary | ICD-10-CM | POA: Diagnosis not present

## 2019-03-27 DIAGNOSIS — J301 Allergic rhinitis due to pollen: Secondary | ICD-10-CM | POA: Diagnosis not present

## 2019-03-29 DIAGNOSIS — L821 Other seborrheic keratosis: Secondary | ICD-10-CM | POA: Diagnosis not present

## 2019-03-29 DIAGNOSIS — D179 Benign lipomatous neoplasm, unspecified: Secondary | ICD-10-CM | POA: Diagnosis not present

## 2019-03-29 DIAGNOSIS — D1801 Hemangioma of skin and subcutaneous tissue: Secondary | ICD-10-CM | POA: Diagnosis not present

## 2019-03-29 DIAGNOSIS — L309 Dermatitis, unspecified: Secondary | ICD-10-CM | POA: Diagnosis not present

## 2019-04-03 DIAGNOSIS — J301 Allergic rhinitis due to pollen: Secondary | ICD-10-CM | POA: Diagnosis not present

## 2019-04-10 DIAGNOSIS — J301 Allergic rhinitis due to pollen: Secondary | ICD-10-CM | POA: Diagnosis not present

## 2019-04-15 ENCOUNTER — Telehealth: Payer: Self-pay | Admitting: Family Medicine

## 2019-04-15 NOTE — Chronic Care Management (AMB) (Signed)
  Chronic Care Management   Note  04/15/2019 Name: Andrea Savage MRN: [id number] DOB: [birth year]  Andrea Savage is a 74 y.o. year old female who is a primary care patient of Valerie Roys, DO. I reached out to Andrea Savage by phone today in response to a referral sent by Andrea Savage's health plan.     Andrea Savage was given information about Chronic Care Management services today including:  1. CCM service includes personalized support from designated clinical staff supervised by her physician, including individualized plan of care and coordination with other care providers 2. 24/7 contact phone numbers for assistance for urgent and routine care needs. 3. Service will only be billed when office clinical staff spend 20 minutes or more in a month to coordinate care. 4. Only one practitioner may furnish and bill the service in a calendar month. 5. The patient may stop CCM services at any time (effective at the end of the month) by phone call to the office staff. 6. The patient will be responsible for cost sharing (co-pay) of up to 20% of the service fee (after annual deductible is met).  Patient agreed to services and verbal consent obtained.   Follow up plan: Telephone appointment with care management team member scheduled for:05/31/2019  Noreene Larsson, [place], [place], [place] [address] Direct Dial: [phone] [email or web address] Website: [email or web address]

## 2019-04-24 DIAGNOSIS — J301 Allergic rhinitis due to pollen: Secondary | ICD-10-CM | POA: Diagnosis not present

## 2019-04-26 DIAGNOSIS — J301 Allergic rhinitis due to pollen: Secondary | ICD-10-CM | POA: Diagnosis not present

## 2019-05-01 DIAGNOSIS — J301 Allergic rhinitis due to pollen: Secondary | ICD-10-CM | POA: Diagnosis not present

## 2019-05-14 ENCOUNTER — Other Ambulatory Visit: Payer: Self-pay

## 2019-05-14 MED ORDER — BECLOMETHASONE DIPROPIONATE 40 MCG/ACT IN AERS: 2.0000 | INHALATION_SPRAY | Freq: Two times a day (BID) | RESPIRATORY_TRACT | 3 refills | Status: DC

## 2019-05-14 NOTE — Telephone Encounter (Signed)
LOV: 01/21/2019, NOV: 07/23/2019 with Park Liter, DO

## 2019-05-15 ENCOUNTER — Telehealth: Payer: Self-pay

## 2019-05-15 NOTE — Telephone Encounter (Signed)
Prior Authorization initiated via CoverMyMeds for Qvar [employer]er [employer]MCG/ACT [employer]: [id number]

## 2019-05-16 NOTE — Telephone Encounter (Signed)
PA Approved

## 2019-05-22 DIAGNOSIS — J301 Allergic rhinitis due to pollen: Secondary | ICD-10-CM | POA: Diagnosis not present

## 2019-05-29 DIAGNOSIS — J301 Allergic rhinitis due to pollen: Secondary | ICD-10-CM | POA: Diagnosis not present

## 2019-05-31 ENCOUNTER — Ambulatory Visit (INDEPENDENT_AMBULATORY_CARE_PROVIDER_SITE_OTHER): Payer: PPO | Admitting: General Practice

## 2019-05-31 ENCOUNTER — Telehealth: Payer: BC Managed Care – PPO | Admitting: General Practice

## 2019-05-31 DIAGNOSIS — J4540 Moderate persistent asthma, uncomplicated: Secondary | ICD-10-CM | POA: Diagnosis not present

## 2019-05-31 DIAGNOSIS — F3341 Major depressive disorder, recurrent, in partial remission: Secondary | ICD-10-CM | POA: Diagnosis not present

## 2019-05-31 NOTE — Chronic Care Management (AMB) (Signed)
Chronic Care Management   Initial Visit Note  05/31/2019 Name: Andrea Savage MRN: [id number] DOB: February 05, 1946  Referred by: Valerie Roys, DO Reason for referral : Chronic Care Management (RNCM: Chronic Disease Management and Care coordination needs )   Andrea Savage is a 74 y.o. year old female who is a primary care patient of Valerie Roys, DO. The CCM team was consulted for assistance with chronic disease management and care coordination needs related to Depression and Asthma  Review of patient status, including review of consultants reports, relevant laboratory and other test results, and collaboration with appropriate care team members and the patient's provider was performed as part of comprehensive patient evaluation and provision of chronic care management services.    SDOH (Social Determinants of Health) assessments performed: Yes See Care Plan activities for detailed interventions related to SDOH  SDOH Interventions     Most Recent Value  SDOH Interventions  SDOH Interventions for the Following Domains  Stress, Physical Activity  Physical Activity Interventions  Other (Comments)       Medications: Outpatient Encounter Medications as of 05/31/2019  Medication Sig  . busPIRone (BUSPAR) 5 MG tablet Take 1 tablet (5 mg total) by mouth daily.  Marland Kitchen CALCIUM-MAGNESIUM-ZINC PO Take by mouth daily.  . citalopram (CELEXA) 40 MG tablet Take 1 tablet (20 mg total) by mouth daily.  . Emollient (COLLAGEN EX) Apply topically.  . metoprolol succinate (TOPROL-XL) 25 MG 24 hr tablet Take 0.5 tablets (12.5 mg total) by mouth daily.  . naproxen sodium (ALEVE) 220 MG tablet Take 440 mg by mouth.  Marland Kitchen omeprazole (PRILOSEC) 40 MG capsule   . pantoprazole (PROTONIX) 40 MG tablet Take 40 mg by mouth daily.  Marland Kitchen albuterol (PROVENTIL HFA;VENTOLIN HFA) 108 (90 Base) MCG/ACT inhaler Inhale 2 puffs into the lungs every 6 (six) hours as needed for wheezing or shortness of breath. (Patient not  taking: Reported on 01/21/2019)  . beclomethasone (QVAR) 40 MCG/ACT inhaler Inhale 2 puffs into the lungs 2 (two) times daily.  . diphenhydrAMINE (BENADRYL) 25 MG tablet Take 25 mg by mouth daily.  Marland Kitchen loratadine (CLARITIN) 10 MG tablet Take 10 mg by mouth daily.   No facility-administered encounter medications on file as of 05/31/2019.     Objective:   Goals Addressed            This Visit's Progress   . RNCM: Pt- "I try to live a healthy lifestyle" (pt-stated)       CARE PLAN ENTRY (see longtitudinal plan of care for additional care plan information)  Current Barriers:  . Chronic Disease Management support, education, and care coordination needs related to Depression and Asthma   Clinical Goal(s) related to Depression and Asthma :  Over the next 120 days, patient will:  . Work with the care management team to address educational, disease management, and care coordination needs  . Begin or continue self health monitoring activities as directed today  improve activity level as tolerated, and monitor for changes in allergies and breathing issues related to Asthma  . Call provider office for new or worsened signs and symptoms Oxygen saturation lower than established parameter, Shortness of breath, and New or worsened symptom related to depression or change in cardiac functions . Call care management team with questions or concerns . Verbalize basic understanding of patient centered plan of care established today  Interventions related to Depression and Asthma :  . Evaluation of current treatment plans and patient's adherence to plan as established  by provider . Assessed patient understanding of disease states.  The patient has a good understanding of her health conditions and feels they are under good control at this time.  . Assessed patient's education and care coordination needs: The patient states that she needs refills for her inhalers. The patient ask if Dr. [name] could refill  her Ventolin (Albuterol, Proventil HFA; Ventolin HFA 108 mcg/ACT inhaler) inhaler and Qvar.  The patient is out of both and is no longer going to the pulmonary doctor at [facility]. Will collaborate with Dr. [name] concerning the patients request. Involve pharmacy support as needed.  . Assessed activity level. The patient has not been active due to not being able to get out due to COVID and foot surgery x 2. The patient is happy about the weather changes and feeling like getting out and being more active. The patient and her husband have had both vaccines and are excited to be able to get together with family again.  . Provided disease specific education to patient: the patient is following a heart healthy diet and loves to cook. Nash Dimmer with appropriate clinical care team members regarding patient needs- knows about LCSW and Pharmacist.   Patient Self Care Activities related to Depression and Asthma :  . Patient is unable to independently self-manage chronic health conditions  Initial goal documentation         Ms. Keys was given information about Chronic Care Management services today including:  1. CCM service includes personalized support from designated clinical staff supervised by her physician, including individualized plan of care and coordination with other care providers 2. 24/7 contact phone numbers for assistance for urgent and routine care needs. 3. Service will only be billed when office clinical staff spend 20 minutes or more in a month to coordinate care. 4. Only one practitioner may furnish and bill the service in a calendar month. 5. The patient may stop CCM services at any time (effective at the end of the month) by phone call to the office staff. 6. The patient will be responsible for cost sharing (co-pay) of up to 20% of the service fee (after annual deductible is met).  Patient agreed to services and verbal consent obtained.   Plan:   The care management team will  reach out to the patient again over the next 60 days.   Noreene Larsson RN, MSN, [facility] Mobile: [phone]

## 2019-05-31 NOTE — Patient Instructions (Signed)
Visit Information  Goals Addressed            This Visit's Progress   . RNCM: Pt- "I try to live a healthy lifestyle" (pt-stated)       CARE PLAN ENTRY (see longtitudinal plan of care for additional care plan information)  Current Barriers:  . Chronic Disease Management support, education, and care coordination needs related to Depression and Asthma   Clinical Goal(s) related to Depression and Asthma :  Over the next 120 days, patient will:  . Work with the care management team to address educational, disease management, and care coordination needs  . Begin or continue self health monitoring activities as directed today  improve activity level as tolerated, and monitor for changes in allergies and breathing issues related to Asthma  . Call provider office for new or worsened signs and symptoms Oxygen saturation lower than established parameter, Shortness of breath, and New or worsened symptom related to depression or change in cardiac functions . Call care management team with questions or concerns . Verbalize basic understanding of patient centered plan of care established today  Interventions related to Depression and Asthma :  . Evaluation of current treatment plans and patient's adherence to plan as established by provider . Assessed patient understanding of disease states.  The patient has a good understanding of her health conditions and feels they are under good control at this time.  . Assessed patient's education and care coordination needs: The patient states that she needs refills for her inhalers. The patient ask if Dr. [name] could refill her Ventolin (Albuterol, Proventil HFA; Ventolin HFA 108 mcg/ACT inhaler) inhaler and Qvar.  The patient is out of both and is no longer going to the pulmonary doctor at [facility]. Will collaborate with Dr. [name] concerning the patients request. Involve pharmacy support as needed.  . Assessed activity level. The patient has not been active due  to not being able to get out due to COVID and foot surgery x 2. The patient is happy about the weather changes and feeling like getting out and being more active. The patient and her husband have had both vaccines and are excited to be able to get together with family again.  . Provided disease specific education to patient: the patient is following a heart healthy diet and loves to cook. Nash Dimmer with appropriate clinical care team members regarding patient needs- knows about LCSW and Pharmacist.   Patient Self Care Activities related to Depression and Asthma :  . Patient is unable to independently self-manage chronic health conditions  Initial goal documentation        Ms. Saephan was given information about Chronic Care Management services today including:  1. CCM service includes personalized support from designated clinical staff supervised by her physician, including individualized plan of care and coordination with other care providers 2. 24/7 contact phone numbers for assistance for urgent and routine care needs. 3. Service will only be billed when office clinical staff spend 20 minutes or more in a month to coordinate care. 4. Only one practitioner may furnish and bill the service in a calendar month. 5. The patient may stop CCM services at any time (effective at the end of the month) by phone call to the office staff. 6. The patient will be responsible for cost sharing (co-pay) of up to 20% of the service fee (after annual deductible is met).  Patient agreed to services and verbal consent obtained.   Patient verbalizes understanding of instructions provided  today.   The care management team will reach out to the patient again over the next 60 days.   Noreene Larsson RN, MSN, [facility] Mobile: [phone]

## 2019-06-05 DIAGNOSIS — J301 Allergic rhinitis due to pollen: Secondary | ICD-10-CM | POA: Diagnosis not present

## 2019-06-12 DIAGNOSIS — J301 Allergic rhinitis due to pollen: Secondary | ICD-10-CM | POA: Diagnosis not present

## 2019-06-19 DIAGNOSIS — J301 Allergic rhinitis due to pollen: Secondary | ICD-10-CM | POA: Diagnosis not present

## 2019-06-20 ENCOUNTER — Encounter: Payer: Self-pay | Admitting: Physician Assistant

## 2019-06-20 ENCOUNTER — Ambulatory Visit (INDEPENDENT_AMBULATORY_CARE_PROVIDER_SITE_OTHER): Payer: PPO | Admitting: Physician Assistant

## 2019-06-20 ENCOUNTER — Other Ambulatory Visit: Payer: Self-pay

## 2019-06-20 VITALS — BP 144/72 | HR 58 | Ht 64.5 in | Wt 155.2 lb

## 2019-06-20 DIAGNOSIS — I739 Peripheral vascular disease, unspecified: Secondary | ICD-10-CM | POA: Diagnosis not present

## 2019-06-20 DIAGNOSIS — R0600 Dyspnea, unspecified: Secondary | ICD-10-CM | POA: Diagnosis not present

## 2019-06-20 DIAGNOSIS — I471 Supraventricular tachycardia: Secondary | ICD-10-CM

## 2019-06-20 DIAGNOSIS — R0602 Shortness of breath: Secondary | ICD-10-CM

## 2019-06-20 DIAGNOSIS — R42 Dizziness and giddiness: Secondary | ICD-10-CM | POA: Diagnosis not present

## 2019-06-20 DIAGNOSIS — R55 Syncope and collapse: Secondary | ICD-10-CM

## 2019-06-20 MED ORDER — ASPIRIN EC 81 MG PO TBEC: 81.0000 mg | DELAYED_RELEASE_TABLET | Freq: Every day | ORAL | 3 refills | Status: DC

## 2019-06-20 MED ORDER — FUROSEMIDE 20 MG PO TABS: 20.0000 mg | ORAL_TABLET | Freq: Every day | ORAL | 3 refills | Status: DC

## 2019-06-20 NOTE — Progress Notes (Signed)
Office Visit    Patient Name: Andrea Savage Date of Encounter: 06/23/2019  Primary Care Provider:  Valerie Roys, DO Primary Cardiologist:  No primary care provider on file.  Chief Complaint    Chief Complaint  Patient presents with  . office visit    Pt states legs feel heavy and weak (not sure if it is caused by activities done at home or something underlying). States having swelling in Legs ankles and feet. Meds verbally reviewed w/ pt.    74 year old female with history of PSVT, recurrent syncope/near syncope, possible asthma, fibromyalgia, depression, postherpetic neuralgia, and seen today for follow-up.  Past Medical History    Past Medical History:  Diagnosis Date  . Asthma   . Depression   . Fibromyalgia   . GERD (gastroesophageal reflux disease)   . H/O scarlet fever    as child  . Heart murmur    s/p scarlet fever as child  . IBS (irritable bowel syndrome)   . Low bone density   . Osteopenia   . Reflux   . Rosacea   . Wears hearing aid in both ears    Past Surgical History:  Procedure Laterality Date  . ABDOMINAL HYSTERECTOMY  2009   Total  . BREAST EXCISIONAL BIOPSY Left 1979   NEG  . COLONOSCOPY  [id number]   repeat 10 years  . EXCISION MORTON'S NEUROMA Left 09/21/2017   Procedure: EXCISION MORTON'S NEUROMA;  Surgeon: Albertine Patricia, DPM;  Location: [place];  Service: Podiatry;  Laterality: Left;  . FOOT SURGERY Right 1990  . HAND SURGERY Right   . LAPAROSCOPIC OOPHERECTOMY    . MINOR HARDWARE REMOVAL Left 11/22/2018   Procedure: REMOVAL SCREW;DEEP 2nd and 3RD LEFT CAPSULE RELEASE OF THIRD METATARSAL PHALANGEAL JOINT LEFT, TENDON LENGTHING THIRD TOE LEFT AND CORTISONE INJECTION LEFT;  Surgeon: Albertine Patricia, DPM;  Location: [place];  Service: Podiatry;  Laterality: Left;  LMA OR IVA LOCAL  . TONSILLECTOMY    . WEIL OSTEOTOMY Left 09/21/2017   Procedure: WEIL OSTEOTOMY-2ND & 3RD TOES;  Surgeon: Albertine Patricia, DPM;   Location: [place];  Service: Podiatry;  Laterality: Left;  LMA WITH LOCAL MINI MONSTER DR TROXLER WANTS TED HOSE ON PATIENT'S RIGHT LEG    Allergies  Allergies  Allergen Reactions  . Compazine [Prochlorperazine] Other (See Comments)    Grand mal seizure  . Dust Mite Extract     History of Present Illness    Andrea Savage is a 74 y.o. female with PMH as above.  She was last seen by her primary cardiologist 1 year ago on 06/13/2018.  At that time, it was noted that she was still awaiting scheduling of her MRI/MRA to exclude underlying neurologic process.  She was doing very well.  She had accidentally missed a couple of her doses of metoprolol and noted vague lightheadedness and chest discomfort/palpitations while off of this medication.  Symptoms resolved with resuming metoprolol succinate 12.5 mg daily.  She was exercising regularly on her treadmill and following COVID-19 precautions.  A month-long trip to [place] was canceled due to COVID-19.  She had recently adjusted the timing of her albuterol to QVAR, which had significantly improved her cough.  No medication changes or further testing was recommended at that time.   She presents to clinic today for follow-up and notes lower extremity edema and right sided lower extremity weakness, as well as some new shortness of breath. She fell on the sidewalk approximately  1 year ago but otherwise denies any recent falls or LOC.She reports that her right leg has felt heavy over the last 6 months and notes calf pain with ambulation. She denies She denies chest pain, palpitations, orthopnea, n, v, dizziness, syncope, or early satiety. She does note that she had recently loss some weight and then regained it. She has been dealing with some environmental allergies. She reports medication compliance. No s/sx consistent with bleeding.  Home Medications    Prior to Admission medications   Medication Sig Start Date End Date Taking? Authorizing  Provider  albuterol (PROVENTIL HFA;VENTOLIN HFA) 108 (90 Base) MCG/ACT inhaler Inhale 2 puffs into the lungs every 6 (six) hours as needed for wheezing or shortness of breath. Patient not taking: Reported on 01/21/2019 02/02/16   Volney American, PA-C  Ascorbic Acid (VITAMIN C) 100 MG tablet Take 100 mg by mouth daily.    [provider]  beclomethasone (QVAR) 40 MCG/ACT inhaler Inhale 2 puffs into the lungs 2 (two) times daily. 05/14/19   Johnson, Megan P, DO  busPIRone (BUSPAR) 5 MG tablet Take 1 tablet (5 mg total) by mouth daily. 01/21/19   Johnson, Megan P, DO  CALCIUM-MAGNESIUM-ZINC PO Take by mouth daily.    [provider]  calcium-vitamin D (OSCAL WITH D) 500-200 MG-UNIT tablet Take 1 tablet by mouth.    [provider]  cetirizine (ZYRTEC) 10 MG tablet Take 10 mg by mouth daily.    [provider]  citalopram (CELEXA) 40 MG tablet Take 1 tablet (20 mg total) by mouth daily. 01/21/19   Johnson, Megan P, DO  diphenhydrAMINE (BENADRYL) 25 MG tablet Take 25 mg by mouth daily.    [provider]  Emollient (AVEENO POSITIVELY AGELESS BODY EX) Apply topically.    [provider]  Emollient (COLLAGEN EX) Apply topically.    [provider]  KRILL OIL PO Take by mouth.    [provider]  loratadine (CLARITIN) 10 MG tablet Take 10 mg by mouth daily.    [provider]  metoprolol succinate (TOPROL-XL) 25 MG 24 hr tablet Take 0.5 tablets (12.5 mg total) by mouth daily. 06/13/18   End, Harrell Gave, MD  naproxen sodium (ALEVE) 220 MG tablet Take 440 mg by mouth.    [provider]  Nutritional Supplements (PROTEIN SUPPLEMENT 80% PO) Take by mouth.    [provider]  omega-3 acid ethyl esters (LOVAZA) 1 g capsule Take 1 g by mouth daily.    [provider]  omeprazole (PRILOSEC) 40 MG capsule  09/25/18   [provider]  pantoprazole (PROTONIX) 40 MG tablet Take 40 mg by mouth  daily. 11/10/17   [provider]    Review of Systems    She denies chest pain, palpitations, dyspnea, pnd, orthopnea, n, v, dizziness, syncope, or early satiety. She reports some SOB/DOE and R lower extremity weakness and b/l edema. She notes a fall 1 year ago. She lost some weight but has since regained it.  All other systems reviewed and are otherwise negative except as noted above.  Physical Exam    VS:  BP (!) 144/72 (BP Location: Left Arm, Patient Position: Sitting, Cuff Size: Normal)   Pulse (!) 58   Ht 5' 4.5" (1.638 m)   Wt 155 lb 4 oz (70.4 kg)   SpO2 98%   BMI 26.24 kg/m  , BMI Body mass index is 26.24 kg/m. GEN: Well nourished, well developed, in no acute distress. HEENT: normal.  Neck: Supple, no JVD, carotid bruits, or masses. Cardiac: RRR, no murmurs, rubs, or gallops. No clubbing, cyanosis. Mild b/l LEE.  Radials 2+ and equal bilaterally. DP/PT 1+ bilaterally and slow capillary refill. Respiratory:  Respirations regular and unlabored, clear to auscultation bilaterally. GI: Soft, nontender, nondistended, BS + x 4. MS: no deformity or atrophy. Skin: warm and dry, no rash. Neuro:  Strength and sensation are intact. Psych: Normal affect.  Accessory Clinical Findings    ECG personally reviewed by me today -NSR, 68 bpm, no acute changes from previous- no acute changes.  VITALS Reviewed today   Temp Readings from Last 3 Encounters:  01/21/19 98.2 F (36.8 C)  11/22/18 (!) 97.2 F (36.2 C)  04/25/18 98.3 F (36.8 C) (Oral)   BP Readings from Last 3 Encounters:  06/20/19 (!) 144/72  01/21/19 128/68  11/22/18 129/66   Pulse Readings from Last 3 Encounters:  06/20/19 (!) 58  01/21/19 70  11/22/18 81    Wt Readings from Last 3 Encounters:  06/20/19 155 lb 4 oz (70.4 kg)  01/21/19 147 lb (66.7 kg)  11/22/18 139 lb (63 kg)     LABS  reviewed today   CareEverwhere Labs present and most recent? Yes/No: No  Lab Results  Component Value Date   WBC  5.4 06/20/2019   HGB 13.7 06/20/2019   HCT 40.3 06/20/2019   MCV 95 06/20/2019   PLT 188 06/20/2019   Lab Results  Component Value Date   CREATININE 0.75 06/20/2019   BUN 18 06/20/2019   NA 139 06/20/2019   K 4.7 06/20/2019   CL 102 06/20/2019   CO2 25 06/20/2019   Lab Results  Component Value Date   ALT 16 06/20/2019   AST 21 06/20/2019   ALKPHOS 72 06/20/2019   BILITOT 0.3 06/20/2019   Lab Results  Component Value Date   CHOL 228 (H) 06/20/2019   HDL 59 06/20/2019   LDLCALC 142 (H) 06/20/2019   LDLDIRECT 149 (H) 06/20/2019   TRIG 154 (H) 06/20/2019   CHOLHDL 3.9 06/20/2019    Lab Results  Component Value Date   HGBA1C 5.2 09/11/2017   Lab Results  Component Value Date   TSH 1.640 09/11/2017     STUDIES/PROCEDURES reviewed today   [place] 10/2017  The patient was monitored for 12 days, 19 hours.  The predominant rhythm was sinus with an average rate of 76 bpm (range 53-135 bpm in sinus).  There were rare PAC's and PVC's.  33 atrial runs were observed, with a maximal rate of 203 bpm. The longest run lasted 17.3 seconds.  No sustained arrhythmia or prolonged pause was identified.  Patient triggered events correspond to sinus rhythm, PAC's, and atrial runs. Predominantly sinus rhythm with rare PAC's and PVC's.  Multiple atrial runs lasting up to 17 seconds were noted, consistent with paroxysmal supraventricular tachycardia.  09/2017 Echo - Left ventricle: The cavity size was normal. Systolic function was  normal. The estimated ejection fraction was in the range of 60%  to 65%. Wall motion was normal; there were no regional wall  motion abnormalities. Doppler parameters are consistent with  abnormal left ventricular relaxation (grade 1 diastolic  dysfunction).  - Mitral valve: There was mild regurgitation.  - Left atrium: The atrium was normal in size.  - Right ventricle: Systolic function was normal.  - Pulmonary arteries: Systolic pressure was  within the normal  range. PA peak pressure: 34 mm Hg (S).    Assessment & Plan  PSVT History of near syncope and lightheadedness --Sx fairly well controlled on current medications. Continue Toprol at current dose and consider increase at RTC if BP still elevated at that time as below versus start of alternative antihypertensive.   Acute on chronic HFpEF SOB --Slightly volume overloaded on exam with ReDS vest 36% and weight increase from previous visits. Reports SOB/DOE. Start lasix 20mg  daily with BMET today and in 1 week. Update echo with previous echo above showing EF 60-65% and G1DD, mild MR, and PASP 34mmHg. BP slightly elevated today; however, will defer any medication changes and reassess BP at RTC and after diuresis. If BP still sub-optimal at RTC, escalate BB if room in HR.   RLE Claudication sx RLE weakness --Reports right LE weakness and claudication sx that occur with ambulation. Reports a fall 1 year ago. Recommend arterial lower extremity studies, given reduced lower extremity pulses on exam. Recheck lipids and liver function. Start ASA 81mg  daily. Check CBC.  Medication changes: Start Lasix 20mg  daily, ASA 81mg  daily Labs ordered: Lipids / dLDL, LFTs, BMET, CBC Studies / Imaging ordered: Echo, ABI, LE arterial duplex Future considerations: Statin Disposition: RTC 2 weeks.    Arvil Chaco, PA-C

## 2019-06-20 NOTE — Patient Instructions (Signed)
Medication Instructions:  1- Lasix Take 1 tablet (20 mg total) by mouth daily 2- ASA Take 1 tablet (81 mg total) by mouth daily *If you need a refill on your cardiac medications before your next appointment, please call your pharmacy*   Lab Work: 1- Your physician recommends that you have lab work today(lipid w direct, lft, bmet, cbc)  2-Your physician recommends that you return for lab work in: [facility] ([facility]) No appt is needed. Hours are M-F 7AM- 6 PM.  If you have labs (blood work) drawn today and your tests are completely normal, you will receive your results only by: Marland Kitchen MyChart Message (if you have MyChart) OR . A paper copy in the mail If you have any lab test that is abnormal or we need to change your treatment, we will call you to review the results.   Testing/Procedures: 1- Echo  Please return to [facility] on ______________ at _______________ AM/PM for an Echocardiogram. Your physician has requested that you have an echocardiogram. Echocardiography is a painless test that uses sound waves to create images of your heart. It provides your doctor with information about the size and shape of your heart and how well your heart's chambers and valves are working. This procedure takes approximately one hour. There are no restrictions for this procedure. Please note; depending on visual quality an IV may need to be placed.    2- Your physician has requested that you have an ankle brachial index (ABI). During this test an ultrasound and blood pressure cuff are used to evaluate the arteries that supply the arms and legs with blood. Allow thirty minutes for this exam. There are no restrictions or special instructions.  Your physician has requested that you have a lower extremity arterial exercise duplex. During this test, exercise and ultrasound are used to evaluate arterial blood flow in the legs. Allow one hour for this exam. There are no restrictions or  special instructions.  Follow-Up: At [facility], you and your health needs are our priority.  As part of our continuing mission to provide you with exceptional heart care, we have created designated Provider Care Teams.  These Care Teams include your primary Cardiologist (physician) and Advanced Practice Providers (APPs -  Physician Assistants and Nurse Practitioners) who all work together to provide you with the care you need, when you need it.  We recommend signing up for the patient portal called "MyChart".  Sign up information is provided on this After Visit Summary.  MyChart is used to connect with patients for Virtual Visits (Telemedicine).  Patients are able to view lab/test results, encounter notes, upcoming appointments, etc.  Non-urgent messages can be sent to your provider as well.   To learn more about what you can do with MyChart, go to [email or web address].    Your next appointment:   2 week(s)  The format for your next appointment:   In Person  Provider:  Please establish primary cardiologist at your next visit

## 2019-06-21 ENCOUNTER — Ambulatory Visit (INDEPENDENT_AMBULATORY_CARE_PROVIDER_SITE_OTHER): Payer: PPO

## 2019-06-21 DIAGNOSIS — R0600 Dyspnea, unspecified: Secondary | ICD-10-CM

## 2019-06-21 LAB — CBC
Hematocrit: 40.3 % (ref 34.0–46.6)
Hemoglobin: 13.7 g/dL (ref 11.1–15.9)
MCH: 32.3 pg (ref 26.6–33.0)
MCHC: 34 g/dL (ref 31.5–35.7)
MCV: 95 fL (ref 79–97)
Platelets: 188 10*3/uL (ref 150–450)
RBC: 4.24 x10E6/uL (ref 3.77–5.28)
RDW: 12.5 % (ref 11.7–15.4)
WBC: 5.4 10*3/uL (ref 3.4–10.8)

## 2019-06-21 LAB — BASIC METABOLIC PANEL
BUN/Creatinine Ratio: 24 (ref 12–28)
BUN: 18 mg/dL (ref 8–27)
CO2: 25 mmol/L (ref 20–29)
Calcium: 9.3 mg/dL (ref 8.7–10.3)
Chloride: 102 mmol/L (ref 96–106)
Creatinine, Ser: 0.75 mg/dL (ref 0.57–1.00)
GFR calc Af Amer: 91 mL/min/{1.73_m2} (ref >59)
GFR calc non Af Amer: 79 mL/min/{1.73_m2} (ref >59)
Glucose: 92 mg/dL (ref 65–99)
Potassium: 4.7 mmol/L (ref 3.5–5.2)
Sodium: 139 mmol/L (ref 134–144)

## 2019-06-21 LAB — HEPATIC FUNCTION PANEL
ALT: 16 IU/L (ref 0–32)
AST: 21 IU/L (ref 0–40)
Albumin: 4.2 g/dL (ref 3.7–4.7)
Alkaline Phosphatase: 72 IU/L (ref 39–117)
Bilirubin Total: 0.3 mg/dL (ref 0.0–1.2)
Bilirubin, Direct: 0.1 mg/dL (ref 0.00–0.40)
Total Protein: 6.4 g/dL (ref 6.0–8.5)

## 2019-06-21 LAB — LIPID PANEL
Chol/HDL Ratio: 3.9 ratio (ref 0.0–4.4)
Cholesterol, Total: 228 mg/dL — ABNORMAL HIGH (ref 100–199)
HDL: 59 mg/dL (ref >39)
LDL Chol Calc (NIH): 142 mg/dL — ABNORMAL HIGH (ref 0–99)
Triglycerides: 154 mg/dL — ABNORMAL HIGH (ref 0–149)
VLDL Cholesterol Cal: 27 mg/dL (ref 5–40)

## 2019-06-21 LAB — LDL CHOLESTEROL, DIRECT: LDL Direct: 149 mg/dL — ABNORMAL HIGH (ref 0–99)

## 2019-06-24 ENCOUNTER — Telehealth: Payer: Self-pay

## 2019-06-24 DIAGNOSIS — E7800 Pure hypercholesterolemia, unspecified: Secondary | ICD-10-CM

## 2019-06-24 MED ORDER — ROSUVASTATIN CALCIUM 40 MG PO TABS: 40.0000 mg | ORAL_TABLET | Freq: Every day | ORAL | 3 refills | Status: DC

## 2019-06-24 NOTE — Telephone Encounter (Signed)
-----   Message from Arvil Chaco, PA-C sent at 06/24/2019  2:03 PM EDT ----- Blood counts stable. Continue ASA 81mg  daily.   Renal function stable at baseline with plan to recheck 1 week after start of lasix (please ensure she has a follow-up BMET lab scheduled for this week to reassess kidney function following start of lasix 20mg  daily).   Her liver function is stable. Her cholesterol was elevated, specifically her LDL at 149. Would she be agreeable to start Crestor 40mg  with repeat lipid and liver function in 6-8 weeks? We had tentatively discussed this at her last visit.

## 2019-06-24 NOTE — Telephone Encounter (Signed)
Call to patient to review labs.    Pt verbalized understanding and has no further questions at this time.    Advised pt to call for any further questions or concerns.  Orders placed as requested by provider.

## 2019-06-26 DIAGNOSIS — J301 Allergic rhinitis due to pollen: Secondary | ICD-10-CM | POA: Diagnosis not present

## 2019-06-27 ENCOUNTER — Other Ambulatory Visit: Payer: Self-pay

## 2019-06-27 ENCOUNTER — Other Ambulatory Visit: Payer: Self-pay | Admitting: Internal Medicine

## 2019-06-27 ENCOUNTER — Telehealth: Payer: Self-pay | Admitting: Physician Assistant

## 2019-06-27 ENCOUNTER — Ambulatory Visit (HOSPITAL_COMMUNITY)
Admission: RE | Admit: 2019-06-27 | Discharge: 2019-06-27 | Disposition: A | Discharge status: Home / Self Care | Payer: PPO | Source: Ambulatory Visit | Attending: Cardiology | Admitting: Cardiology

## 2019-06-27 DIAGNOSIS — I739 Peripheral vascular disease, unspecified: Secondary | ICD-10-CM

## 2019-06-27 NOTE — Telephone Encounter (Signed)
Arvil Chaco, PA-C  06/27/2019 4:07 PM EDT    Please let Andrea Savage know that her echo showed normal pump function as seen in her previous 09/2017 study. The walls of her heart are moving normally. This is very reassuring.  Her heart is slightly more stiff than in her 2019 study, which can happen with elevated blood pressure. Does she know what her blood pressure has been running lately at home?  Her mitral valve is mild to moderately leaky, which was seen in her 2019 study, at which time it was ruled to be at least mildly leaky. We will continue to keep an eye on this with periodic imaging.   She has mildly elevated pressures on the right side of her heart and additional findings that suggest that she might be holding on to some fluid --these echo findings are consistent with the physical exam from our office visit, at which time we started her on lasix 20mg  daily for elevated volume. Is she feeling better since the start of this lasix?

## 2019-06-27 NOTE — Telephone Encounter (Signed)
I spoke with the patient regarding her echo results. She states she does not consistently check her BP at home, but for the last 2 years, it has been running ~ 121/70. She has only been taking lasix 20 mg for ~ 3 days. She has noticed that she has been having more urine output, but did not state if this was making a difference in how she was feeling.   She was more concerned about being placed on Crestor 40 mg once daily. I reviewed her labs with her again (she had been previously contacted about this.) She could not understand why her LDL was up and if the 40 mg of Crestor was too high a dose.  I advised the patient that her total cholesterol & LDL have been elevated over the last 3-4 years: total cholesterol >200 & LDL ~ 112-120's.  The patient reports she was using a shake every morning for ~ 8 months that had around 60 mg of cholesterol in it.  She has stopped this. Her activity has also been affected by foot surgery over the last year or so.   I have advised the patient, that based on her numbers, the Crestor is being used as a preventative for progression of any plaque buildup. I have asked that she try the recommended dose as prescribed by Jacquelyn, PA and have her labs rechecked in ~ 6-8 weeks as recommended. She will call if she notices any signs of myalgias with this.   To [place], [place] as an [place].

## 2019-06-27 NOTE — Telephone Encounter (Signed)
Noted. Agree with your recommendations. We can certainly decrease her statin dose if she has any myalgias and based on her response to it at follow-up labs. Her blood pressure was running high at clinic; however, I suspect that this was at least in part in the setting of holding on to some fluid. I am hopeful that her blood pressure will be much improved at her next visit.

## 2019-06-28 NOTE — Telephone Encounter (Signed)
Noted  

## 2019-07-01 ENCOUNTER — Other Ambulatory Visit
Admission: RE | Admit: 2019-07-01 | Discharge: 2019-07-01 | Disposition: A | Discharge status: Home / Self Care | Payer: PPO | Source: Ambulatory Visit | Attending: Physician Assistant | Admitting: Physician Assistant

## 2019-07-01 DIAGNOSIS — E7800 Pure hypercholesterolemia, unspecified: Secondary | ICD-10-CM | POA: Diagnosis not present

## 2019-07-01 LAB — BASIC METABOLIC PANEL
Anion gap: 9 (ref 5–15)
BUN: 18 mg/dL (ref 8–23)
CO2: 29 mmol/L (ref 22–32)
Calcium: 9.2 mg/dL (ref 8.9–10.3)
Chloride: 101 mmol/L (ref 98–111)
Creatinine, Ser: 0.83 mg/dL (ref 0.44–1.00)
GFR calc Af Amer: >60 mL/min (ref >60)
GFR calc non Af Amer: >60 mL/min (ref >60)
Glucose, Bld: 106 mg/dL — ABNORMAL HIGH (ref 70–99)
Potassium: 4.3 mmol/L (ref 3.5–5.1)
Sodium: 139 mmol/L (ref 135–145)

## 2019-07-02 ENCOUNTER — Other Ambulatory Visit (HOSPITAL_COMMUNITY): Payer: PPO

## 2019-07-04 NOTE — Progress Notes (Signed)
Office Visit    Patient Name: Andrea Savage Date of Encounter: 07/08/2019  Primary Care Provider:  Valerie Roys, DO Primary Cardiologist:  Nelva Bush, MD  Chief Complaint    Chief Complaint  Patient presents with  . office visit    Pt having SOB/ swelling/ fatigue/ concern w/ statin/ has a stock feeling in hands and legs. Meds verbally reviewed w/ pt.    74 year old female with history of PSVT, recurrent syncope/near syncope, possible asthma, fibromyalgia, depression, postherpetic neuralgia, and seen today for 2 week follow-up and s/p start of statin, recent ABIs/arterial study, and updated echocardiogram.  Past Medical History    Past Medical History:  Diagnosis Date  . Asthma   . Depression   . Fibromyalgia   . GERD (gastroesophageal reflux disease)   . H/O scarlet fever    as child  . Heart murmur    s/p scarlet fever as child  . IBS (irritable bowel syndrome)   . Low bone density   . Osteopenia   . Reflux   . Rosacea   . Wears hearing aid in both ears    Past Surgical History:  Procedure Laterality Date  . ABDOMINAL HYSTERECTOMY  2009   Total  . BREAST EXCISIONAL BIOPSY Left 1979   NEG  . COLONOSCOPY  [id number]   repeat 10 years  . EXCISION MORTON'S NEUROMA Left 09/21/2017   Procedure: EXCISION MORTON'S NEUROMA;  Surgeon: Albertine Patricia, DPM;  Location: [facility];  Service: Podiatry;  Laterality: Left;  . FOOT SURGERY Right 1990  . HAND SURGERY Right   . LAPAROSCOPIC OOPHERECTOMY    . MINOR HARDWARE REMOVAL Left 11/22/2018   Procedure: REMOVAL SCREW;DEEP 2nd and 3RD LEFT CAPSULE RELEASE OF THIRD METATARSAL PHALANGEAL JOINT LEFT, TENDON LENGTHING THIRD TOE LEFT AND CORTISONE INJECTION LEFT;  Surgeon: Albertine Patricia, DPM;  Location: [facility];  Service: Podiatry;  Laterality: Left;  LMA OR IVA LOCAL  . TONSILLECTOMY    . WEIL OSTEOTOMY Left 09/21/2017   Procedure: WEIL OSTEOTOMY-2ND & 3RD TOES;  Surgeon: Albertine Patricia, DPM;   Location: [facility];  Service: Podiatry;  Laterality: Left;  LMA WITH LOCAL MINI MONSTER DR TROXLER WANTS TED HOSE ON PATIENT'S RIGHT LEG    Allergies  Allergies  Allergen Reactions  . Compazine [Prochlorperazine] Other (See Comments)    Grand mal seizure  . Dust Mite Extract     History of Present Illness    Andrea Savage is a 74 y.o. female with PMH as above.  She was last seen by her primary cardiologist 1 year ago on 06/13/2018 and was still awaiting scheduling of her MRI/MRA to exclude underlying neurologic process. She had accidentally missed a couple of her doses of metoprolol and noted vague lightheadedness and chest discomfort/palpitations while off of this medication.  Symptoms resolved with resuming metoprolol succinate 12.5 mg daily.  She was exercising regularly on her treadmill and following COVID-19 precautions.  A month-long trip to [place] was canceled due to COVID-19.  She had recently adjusted the timing of her albuterol to QVAR, which had significantly improved her cough.  No medication changes or further testing was recommended at that time.   She presented to clinic 06/20/19 and noted PSVT was well controlled on her current medications. Her BP was mildly elevated with medication changes deferred to RTC. She reported weight loss then weight gain. She also noted SOB/DOE and lower extremity edema. ReDS vest performed and 36%. She was started on  lasix 20mg  with repeat labs showing stable renal function. Echo was ordered and performed 4/16 with EF 60-65%, G2DD, PASP 30.47mmHg, RA pressure 66mmHg, and mild to moderate MR.  On 4/22, lower extremity arterial study / ABIs were performed due to R sided lower extremity weakness and claudication sx but without significant findings. Given her elevated cholesterol and LDL over the last several years, as well as on her updated labs, it was recommended that she start Crestor 40mg  once daily with repeat labs in 6-8 weeks.   She  presents to clinic today and is joined today by her daughter. She notes that she has not tolerated Crestor 40 mg once daily well. She reports myalgias and " weird electrical shocks throughout her body," as well as GI/nausea symptoms. In addition, she reports concern regarding progressive fatigue over many years or "from a young age." She drinks 5 cups of coffee daily without alleviation of this fatigue, and this is in addition to energy drinks. She denies CP or racing HR. She notes improved but ongoing and nonsustained palpitations since starting Toprol. She denies presyncope, syncope, or recent LOC.  She notes chronic and unchanged SOB/DOE with chronic productive cough with white phlegm for several years. She did note some relief with her inhaler and some cough syrup; however, she has had difficulty with her most recent refills. No LEE, orthopnea, PND, or early satiety. She does not feel volume overloaded. She is taking her Lasix daily and reports stable weight since starting this medication; however, she has not noticed improvement in her cough or breathing status. She is uncertain if her LEE is improved and notes her main concern regarding her lower extremities is chronic and progressive right lower extremity heaviness and claudication sx, along with R thigh size > L thigh size for at least a year. She would like to get to the bottom of these lower extremity sx, as she reports the pain and weakness is very different than that of her nerve pain 2/2 her back issues or parathesias 2/2 restless leg syndrome. The pain now occurs both with ambulation and at rest. She reports a recent fall 2/2 lower extremity weakness after our last visit and before today, which she states occurred because of her RLE weakness that caused her to trip and hit the ground with both hip and knee but not hit her head (and no LOC). She denies any preceding sx before the fall and is certain it was a mechanical fall.  She reports medication  compliance. No s/sx of bleeding. She notes reduced activity 2/2 RLE sx.  -------------------------- *Of note, she also reported several symptoms today that are ongoing and concern her for possible autoimmune dz, given she has a strong family h/o autoimmune conditions. She notes joint pain and swelling, especially in her hands / knuckles. She has mouth ulcers and a rash down her back that is very itchy and bleeds with scratching it. She is concerned the rash is shingles, which she states she has had 5 times; however, she states her dermatologist attributes itto eczema. She has not yet seen an endocrinologist or rheumatologist regarding these sx. She wonders if an undiagnosed autoimmune condition could be causing her fatigue and R leg pain.  Home Medications    Prior to Admission medications   Medication Sig Start Date End Date Taking? Authorizing Provider  albuterol (PROVENTIL HFA;VENTOLIN HFA) 108 (90 Base) MCG/ACT inhaler Inhale 2 puffs into the lungs every 6 (six) hours as needed for wheezing or shortness of  breath. Patient not taking: Reported on 01/21/2019 02/02/16   Volney American, PA-C  Ascorbic Acid (VITAMIN C) 100 MG tablet Take 100 mg by mouth daily.    [provider]  beclomethasone (QVAR) 40 MCG/ACT inhaler Inhale 2 puffs into the lungs 2 (two) times daily. 05/14/19   Johnson, Megan P, DO  busPIRone (BUSPAR) 5 MG tablet Take 1 tablet (5 mg total) by mouth daily. 01/21/19   Johnson, Megan P, DO  CALCIUM-MAGNESIUM-ZINC PO Take by mouth daily.    [provider]  calcium-vitamin D (OSCAL WITH D) 500-200 MG-UNIT tablet Take 1 tablet by mouth.    [provider]  cetirizine (ZYRTEC) 10 MG tablet Take 10 mg by mouth daily.    [provider]  citalopram (CELEXA) 40 MG tablet Take 1 tablet (20 mg total) by mouth daily. 01/21/19   Johnson, Megan P, DO  diphenhydrAMINE (BENADRYL) 25 MG tablet Take 25 mg by mouth daily.    [provider]    Emollient (AVEENO POSITIVELY AGELESS BODY EX) Apply topically.    [provider]  Emollient (COLLAGEN EX) Apply topically.    [provider]  KRILL OIL PO Take by mouth.    [provider]  loratadine (CLARITIN) 10 MG tablet Take 10 mg by mouth daily.    [provider]  metoprolol succinate (TOPROL-XL) 25 MG 24 hr tablet Take 0.5 tablets (12.5 mg total) by mouth daily. 06/13/18   End, Harrell Gave, MD  naproxen sodium (ALEVE) 220 MG tablet Take 440 mg by mouth.    [provider]  Nutritional Supplements (PROTEIN SUPPLEMENT 80% PO) Take by mouth.    [provider]  omega-3 acid ethyl esters (LOVAZA) 1 g capsule Take 1 g by mouth daily.    [provider]  omeprazole (PRILOSEC) 40 MG capsule  09/25/18   [provider]  pantoprazole (PROTONIX) 40 MG tablet Take 40 mg by mouth daily. 11/10/17   [provider]    Review of Systems    She denies chest pain, pnd, orthopnea, n, v, dizziness, syncope, weight gain, or early satiety. She reports chronic palpitations and improved since starting Toprol XL. She notes significant fatigue. She has ongoing DOE/SOB and productive cough with white phlegm. She notes ongoing right sided LEE with R calf > L calf in size, RLE pain with ambulation and now at rest, and RLE weakness. She notes recent nausea and myalgias, attributed to recently starting Crestor. She notes chronic constipation. She notes a recent mechanical fall attributed to RLE weakness and without hitting her head or LOC. All other systems reviewed and are otherwise negative except as noted above.  Physical Exam    VS:  BP 128/76 (BP Location: Left Arm, Patient Position: Sitting, Cuff Size: Normal)   Pulse 69   Ht 5' 4.5" (1.638 m)   Wt 156 lb 6 oz (70.9 kg)   SpO2 97%   BMI 26.43 kg/m  , BMI Body mass index is 26.43 kg/m. GEN: Well nourished, well developed, in no acute distress. Joined by her daughter. HEENT:  normal. Neck: Supple, no JVD, carotid bruits, or masses. Cardiac: RRR, no murmurs, rubs, or gallops. No clubbing, cyanosis. Mild non-pitting b/l LEE with R calf >L calf.  Radials 2+ and equal bilaterally. DP/PT 1+ bilaterally and slow capillary refill. Pedal erythema that improves with elevation and returns with dependent position.  Respiratory:  Respirations regular and unlabored, clear to auscultation bilaterally. GI: Soft, nontender, nondistended, BS + x  4. MS: no deformity or atrophy. Skin: warm and dry, no rash. maculopapular rash noted along the length of her back and extending from shoulders down. No signs of infection or bleeding.  Neuro:  Strength and sensation are intact. Psych: Normal affect.  Accessory Clinical Findings    ECG personally reviewed by me today - NSR, 69 bpm, no acute changes when compared with previous EKG  VITALS Reviewed today   Temp Readings from Last 3 Encounters:  01/21/19 98.2 F (36.8 C)  11/22/18 (!) 97.2 F (36.2 C)  04/25/18 98.3 F (36.8 C) (Oral)   BP Readings from Last 3 Encounters:  07/08/19 128/76  06/20/19 (!) 144/72  01/21/19 128/68   Pulse Readings from Last 3 Encounters:  07/08/19 69  06/20/19 (!) 58  01/21/19 70    Wt Readings from Last 3 Encounters:  07/08/19 156 lb 6 oz (70.9 kg)  06/20/19 155 lb 4 oz (70.4 kg)  01/21/19 147 lb (66.7 kg)     LABS  reviewed today   CareEverwhere Labs present and most recent? Yes/No: No  Lab Results  Component Value Date   WBC 5.4 06/20/2019   HGB 13.7 06/20/2019   HCT 40.3 06/20/2019   MCV 95 06/20/2019   PLT 188 06/20/2019   Lab Results  Component Value Date   CREATININE 0.83 07/01/2019   BUN 18 07/01/2019   NA 139 07/01/2019   K 4.3 07/01/2019   CL 101 07/01/2019   CO2 29 07/01/2019   Lab Results  Component Value Date   ALT 16 06/20/2019   AST 21 06/20/2019   ALKPHOS 72 06/20/2019   BILITOT 0.3 06/20/2019   Lab Results  Component Value Date   CHOL 228 (H)  06/20/2019   HDL 59 06/20/2019   LDLCALC 142 (H) 06/20/2019   LDLDIRECT 149 (H) 06/20/2019   TRIG 154 (H) 06/20/2019   CHOLHDL 3.9 06/20/2019    Lab Results  Component Value Date   HGBA1C 5.2 09/11/2017   Lab Results  Component Value Date   TSH 1.640 09/11/2017     STUDIES/PROCEDURES reviewed today   Echo 06/21/19 1. Left ventricular ejection fraction, by estimation, is 60 to 65%. The  left ventricle has normal function. The left ventricle has no regional  wall motion abnormalities. Left ventricular diastolic parameters are  consistent with Grade II diastolic  dysfunction (pseudonormalization).  2. Right ventricular systolic function is normal. The right ventricular  size is normal. There is mildly elevated pulmonary artery systolic  pressure.  3. The mitral valve is normal in structure. Mild to moderate mitral valve  regurgitation.  4. The inferior vena cava is dilated in size with <50% respiratory  variability, suggesting right atrial pressure of 15 mmHg.   LE Arterial study 06/27/2019 Summary:  Right: Resting right ankle-brachial index is within normal range.  No evidence of significant right lower extremity arterial disease. The  right toe-brachial index is normal.  Left: Resting left ankle-brachial index is within normal range.  No evidence of significant left lower extremity arterial disease. The left  toe-brachial index is normal.   Zio 10/2017  The patient was monitored for 12 days, 19 hours.  The predominant rhythm was sinus with an average rate of 76 bpm (range 53-135 bpm in sinus).  There were rare PAC's and PVC's.  33 atrial runs were observed, with a maximal rate of 203 bpm. The longest run lasted 17.3 seconds.  No sustained arrhythmia or prolonged pause was identified.  Patient triggered events  correspond to sinus rhythm, PAC's, and atrial runs. Predominantly sinus rhythm with rare PAC's and PVC's.  Multiple atrial runs lasting up to 17 seconds  were noted, consistent with paroxysmal supraventricular tachycardia.   Assessment & Plan    Fatigue, chronic --Reports her main concern is regarding chronic fatigue today.  She continues to report improved palpitations on Toprol-XL and denies racing HR, presyncope, or recent LOC. No s/sx of undiagnosed sleep apnea.  Most recent echo as above with hyperdynamic EF, G2DD, RAP 68mmHgn NWMA, and mild to moderate MR. No angina reported. Euvolemic on exam today. Previous TSH, electrolytes, and Hgb wnl. Also considered is some element of deconditioning as contributing to her fatigue and given her increasingly sedentary lifestyle 2/2 her constellation of symptoms, as above in HPI, and including her right lower extremity weakness/pain. She notes concern that fatigue is 2/2 undiagnosed autoimmune disease, given her family history and constellation of symptoms as detailed above.  -- Recommend increasing activity as tolerated. Her daughter is with her today and does intend to go on daily walks with her mother now to increase activity.Will order ANA, CRP, TSH, and RF today and reach out to her PCP regarding follow-up with a rheumatoid referral or endocrinology referral and further workup if needed. Cautioned her against continuing to drink 5 cups of coffee and energy drinks on a daily basis.. Future considerations include repeat Zio, which we deferred today given stable palpitations and recent 2019 Zio as above. With ongoing fatigue, SOB, and elevated cholesterol as below, could consider MPI in the future as well.   PSVT Chronic palpitations --Palpitations unchanged and still well controlled on current dose of Toprol. No racing HR reported other than her occasional and non-sustained palpitations or "catching," which is unchanged for some time. No further dizziness or near syncope as in the past. No LOC as recent fall is mechanical as below. Given her fatigue as above, considered repeat Zio to rule out asx arrhythmia  or one not captured on 2019 Zio. Will defer for now given stable sx.  Continue Toprol.   HLD  Statin Intolerance --Most recent total cholesterol 228 with direct LDL 149 and triglycerides 154.  She has a history of total lipids > 200 and LDL >100 on labs as early as 2017.   --Started Crestor 40mg  daily since her last visit for risk factor modification.  She did not tolerate this with reported myalgias and significant nausea on this medication; therefore, we will discontinue it. --Patient preference is for lifestyle modification over medicine. Long discussion regarding the likely benefit of Zetia +/- alternate statin or reduced dose statin.  Also discussed were [id number], lipid clinic / pharmacy referral.  --She wishes to research Zetia, and if comfortable with her findings, assess her response to Zetia and lifestyle modifications alone. Prescription provided for Zetia 10mg  daily - she will update [place] if she starts it, and if so, we will plan for lipids in 6-8 weeks to assess her response. She understands that Zetia and lifetyle changes alone may not be sufficient enough to lower her cholesterol. She also understands the need for lipid control for risk factor modification.  Chronic HFpEF  --Reports chronic and unchanged SOB/DOE and chronic productive cough with white phlegm.  Recent echo as above with RAP 61mmHg. She is s/p recent start of Lasix 20mg  daily with improved volume status / euvolemic on exam despite unchanged breathing status, LEE, and cough since start of diuretic. She has, however, noticed stable weight at home (previously labile wt /  wt gain). Clinic weight today relatively stable though did note 1 lb increase. BP improved from last visit. Suspect that her inability to refill her inhaler and cough medication may be contributing to her ongoing sx, given her presentation today is not consistent with volume overload despite the 1 lb weight increase. No medication changes today. Continue current Lasix  20mg  daily. She confirms compliance and continued urine output. Reviewed volume intake and low salt diet. Reassess volume status at RTC.  SOB/DOE, chronic Productive Cough, chronic --Euvolemic on exam s/p start of Lasix and with recent echo above. She reports today that she has been unable to refill her inhaler prescription, noted to improve her breathing status in the past on review of EMR. She also is out of a cough syrup. She will reach out to her PCP regarding refills. Considered also is SOB/DOE 2/2 deconditioning as above with agreement to increase daily activity with daily walks. She will also discuss rheumatology referral with her PCP as above. Low suspicion for SOB/cough 2/2 PE given asymmetric lower extremity size is chronic and 1+ years with no other s/sx suspicious for DVT/PE. Reassess at RTC.   RLE claudication sx and weakness --Reports ongoing R calf pain with ambulation and  now occurring with rest. She also reports lower extremity right-sided weakness and chronic RLE>LLE size 1+ years with lower suspicion for DVT/PE as outlined directly above. Recent arterial/ABI studies without significant findings.  She reports sx are different than those of her lower back/nerve pain, as well as her paresthesias/restless leg syndrome. She has not noticed any change in LEE with lasix 20mg  daily and is otherwise euvolemic on exam. Considered is recheck of A1C / rheumatology referral per PCP. We discussed future repeat vascular studies +/- vascular surgery referral if ongoing / worsening sx for PAD given reduced pulses / exam.  Also discussed risk factor modification with ASA and statin, given her sx and high cholesterol. Considered is some element of deconditioning as well with plan for increased activity / walking. Reassess at RTC.  Recent Mechanical fall --She suffered a recent mechanical fall after her previous clinic visit that she attributes 2/2 her right lower extremity weakness. No dizziness or  concerning sx before her fall and no LOC. No change in racing HR or palpitations as above. She did not hit her head. Reassess at RTC and as outlined directly above.   Rash, cold sores --As above, consider rheumatology referral per PCP and continued follow-up with dermatology. Reports family history of autoimmune sx as above in HPI with fatigue, low immune system, cold sores, frequent shingles, and ongoing rash. Defer to PCP with labs ordered as above. Rash inspected today with no signs of bleeding or infection and maculopapular rash present. Dermatology follow-up and PCP follow-up encouraged.  Medication changes: Start Zetia 10mg  daily, if agreeable after researching it. She will let us know if starting it. Discontinue Crestor 40mg  daily. She will reach out to PCP regarding inhaler and cough syrup. Labs ordered: CRP, RF, ANA. Studies / Imaging ordered: None. Future considerations: Repeat lipids in 6-8 weeks (s/p lifestyle changes +/- Zetia). If still uncontrolled, consider decreased statin versus PCSK9i / lipid clinic. Vascular surgery referral or repeat vascular studies for ongoing / progressive RLE sx consistent with PAD. Referral to rheumatology per PCP as above. Repeat MPI/Zio if ongoing or progressive sx as above. Disposition: RTC 1 month.  Total time spent with patient today 60 minutes. This includes reviewing records, evaluating the patient, and coordinating care. Face-to-face time >50%.  Arvil Chaco, PA-C

## 2019-07-08 ENCOUNTER — Other Ambulatory Visit: Payer: Self-pay

## 2019-07-08 ENCOUNTER — Encounter: Payer: Self-pay | Admitting: Physician Assistant

## 2019-07-08 ENCOUNTER — Ambulatory Visit (INDEPENDENT_AMBULATORY_CARE_PROVIDER_SITE_OTHER): Payer: PPO | Admitting: Physician Assistant

## 2019-07-08 VITALS — BP 128/76 | HR 69 | Ht 64.5 in | Wt 156.4 lb

## 2019-07-08 DIAGNOSIS — R0602 Shortness of breath: Secondary | ICD-10-CM

## 2019-07-08 DIAGNOSIS — R0600 Dyspnea, unspecified: Secondary | ICD-10-CM | POA: Diagnosis not present

## 2019-07-08 DIAGNOSIS — I5032 Chronic diastolic (congestive) heart failure: Secondary | ICD-10-CM

## 2019-07-08 DIAGNOSIS — I739 Peripheral vascular disease, unspecified: Secondary | ICD-10-CM | POA: Diagnosis not present

## 2019-07-08 DIAGNOSIS — R002 Palpitations: Secondary | ICD-10-CM

## 2019-07-08 DIAGNOSIS — Z87898 Personal history of other specified conditions: Secondary | ICD-10-CM | POA: Diagnosis not present

## 2019-07-08 DIAGNOSIS — Z789 Other specified health status: Secondary | ICD-10-CM | POA: Diagnosis not present

## 2019-07-08 DIAGNOSIS — E785 Hyperlipidemia, unspecified: Secondary | ICD-10-CM | POA: Diagnosis not present

## 2019-07-08 DIAGNOSIS — J4540 Moderate persistent asthma, uncomplicated: Secondary | ICD-10-CM | POA: Diagnosis not present

## 2019-07-08 DIAGNOSIS — R5382 Chronic fatigue, unspecified: Secondary | ICD-10-CM

## 2019-07-08 DIAGNOSIS — I471 Supraventricular tachycardia: Secondary | ICD-10-CM | POA: Diagnosis not present

## 2019-07-08 DIAGNOSIS — Z8709 Personal history of other diseases of the respiratory system: Secondary | ICD-10-CM | POA: Diagnosis not present

## 2019-07-08 DIAGNOSIS — W108XXA Fall (on) (from) other stairs and steps, initial encounter: Secondary | ICD-10-CM

## 2019-07-08 MED ORDER — EZETIMIBE 10 MG PO TABS: 10.0000 mg | ORAL_TABLET | Freq: Every day | ORAL | 3 refills | Status: DC

## 2019-07-08 NOTE — Patient Instructions (Addendum)
Medication Instructions:  - Your physician has recommended you make the following change in your medication:   1) Start Zetia (ezetimibe) 10 mg- take 1 tablet by mouth once daily  *If you need a refill on your cardiac medications before your next appointment, please call your pharmacy*   Lab Work: - Your physician recommends that you have lab work today: TSH/ CRP/ Rheumatoid Factor/ ANA  If you have labs (blood work) drawn today and your tests are completely normal, you will receive your results only by: Marland Kitchen MyChart Message (if you have MyChart) OR . A paper copy in the mail If you have any lab test that is abnormal or we need to change your treatment, we will call you to review the results.   Testing/Procedures: - none ordered   Follow-Up: At [facility], you and your health needs are our priority.  As part of our continuing mission to provide you with exceptional heart care, we have created designated Provider Care Teams.  These Care Teams include your primary Cardiologist (physician) and Advanced Practice Providers (APPs -  Physician Assistants and Nurse Practitioners) who all work together to provide you with the care you need, when you need it.  We recommend signing up for the patient portal called "MyChart".  Sign up information is provided on this After Visit Summary.  MyChart is used to connect with patients for Virtual Visits (Telemedicine).  Patients are able to view lab/test results, encounter notes, upcoming appointments, etc.  Non-urgent messages can be sent to your provider as well.   To learn more about what you can do with MyChart, go to [email or web address].    Your next appointment:   4 week(s)  The format for your next appointment:   In Person  Provider:    You may see Nelva Bush, MD or one of the following Advanced Practice Providers on your designated Care Team:    Murray Hodgkins, NP  Christell Faith, PA-C  Marrianne Mood, PA-C    Other  Instructions n/a   Ezetimibe Tablets What is this medicine? EZETIMIBE (ez ET i mibe) blocks the absorption of cholesterol from the stomach. It can help lower blood cholesterol for patients who are at risk of getting heart disease or a stroke. It is only for patients whose cholesterol level is not controlled by diet. This medicine may be used for other purposes; ask your health care provider or pharmacist if you have questions. COMMON BRAND NAME(S): Zetia What should I tell my health care provider before I take this medicine? They need to know if you have any of these conditions:  liver disease  an unusual or allergic reaction to ezetimibe, medicines, foods, dyes, or preservatives  pregnant or trying to get pregnant  breast-feeding How should I use this medicine? Take this medicine by mouth with a glass of water. Follow the directions on the prescription label. This medicine can be taken with or without food. Take your doses at regular intervals. Do not take your medicine more often than directed. Talk to your pediatrician regarding the use of this medicine in children. Special care may be needed. Overdosage: If you think you have taken too much of this medicine contact a poison control center or emergency room at once. NOTE: This medicine is only for you. Do not share this medicine with others. What if I miss a dose? If you miss a dose, take it as soon as you can. If it is almost time for your next dose, take only  that dose. Do not take double or extra doses. What may interact with this medicine? Do not take this medicine with any of the following medications:  fenofibrate  gemfibrozil This medicine may also interact with the following medications:  antacids  cyclosporine  herbal medicines like red yeast rice  other medicines to lower cholesterol or triglycerides This list may not describe all possible interactions. Give your health care provider a list of all the medicines,  herbs, non-prescription drugs, or dietary supplements you use. Also tell them if you smoke, drink alcohol, or use illegal drugs. Some items may interact with your medicine. What should I watch for while using this medicine? Visit your doctor or health care professional for regular checks on your progress. You will need to have your cholesterol levels checked. If you are also taking some other cholesterol medicines, you will also need to have tests to make sure your liver is working properly. Tell your doctor or health care professional if you get any unexplained muscle pain, tenderness, or weakness, especially if you also have a fever and tiredness. You need to follow a low-cholesterol, low-fat diet while you are taking this medicine. This will decrease your risk of getting heart and blood vessel disease. Exercising and avoiding alcohol and smoking can also help. Ask your doctor or dietician for advice. What side effects may I notice from receiving this medicine? Side effects that you should report to your doctor or health care professional as soon as possible:  allergic reactions like skin rash, itching or hives, swelling of the face, lips, or tongue  dark yellow or brown urine  unusually weak or tired  yellowing of the skin or eyes Side effects that usually do not require medical attention (report to your doctor or health care professional if they continue or are bothersome):  diarrhea  dizziness  headache  stomach upset or pain This list may not describe all possible side effects. Call your doctor for medical advice about side effects. You may report side effects to FDA at 1-800-FDA-1088. Where should I keep my medicine? Keep out of the reach of children. Store at room temperature between 15 and 30 degrees C (59 and 86 degrees F). Protect from moisture. Keep container tightly closed. Throw away any unused medicine after the expiration date. NOTE: This sheet is a summary. It may not  cover all possible information. If you have questions about this medicine, talk to your doctor, pharmacist, or health care provider.  2020 Elsevier/Gold Standard (2011-08-29 15:39:09)

## 2019-07-09 LAB — C-REACTIVE PROTEIN: CRP: 10 mg/L (ref 0–10)

## 2019-07-09 LAB — ANA: Anti Nuclear Antibody (ANA): NEGATIVE

## 2019-07-09 LAB — TSH: TSH: 1.84 u[IU]/mL (ref 0.450–4.500)

## 2019-07-09 LAB — RHEUMATOID FACTOR: Rheumatoid fact SerPl-aCnc: <10 IU/mL (ref 0.0–13.9)

## 2019-07-10 DIAGNOSIS — J301 Allergic rhinitis due to pollen: Secondary | ICD-10-CM | POA: Diagnosis not present

## 2019-07-16 DIAGNOSIS — J301 Allergic rhinitis due to pollen: Secondary | ICD-10-CM | POA: Diagnosis not present

## 2019-07-17 DIAGNOSIS — J301 Allergic rhinitis due to pollen: Secondary | ICD-10-CM | POA: Diagnosis not present

## 2019-07-22 DIAGNOSIS — E785 Hyperlipidemia, unspecified: Secondary | ICD-10-CM | POA: Insufficient documentation

## 2019-07-23 ENCOUNTER — Encounter: Payer: Self-pay | Admitting: Family Medicine

## 2019-07-26 ENCOUNTER — Other Ambulatory Visit
Admission: RE | Admit: 2019-07-26 | Discharge: 2019-07-26 | Disposition: A | Discharge status: Home / Self Care | Payer: PPO | Source: Ambulatory Visit | Attending: Physician Assistant | Admitting: Physician Assistant

## 2019-07-26 DIAGNOSIS — E7800 Pure hypercholesterolemia, unspecified: Secondary | ICD-10-CM | POA: Insufficient documentation

## 2019-07-26 LAB — HEPATIC FUNCTION PANEL
ALT: 16 U/L (ref 0–44)
AST: 22 U/L (ref 15–41)
Albumin: 3.9 g/dL (ref 3.5–5.0)
Alkaline Phosphatase: 64 U/L (ref 38–126)
Bilirubin, Direct: <0.1 mg/dL (ref 0.0–0.2)
Total Bilirubin: 1 mg/dL (ref 0.3–1.2)
Total Protein: 6.5 g/dL (ref 6.5–8.1)

## 2019-07-26 LAB — LIPID PANEL
Cholesterol: 213 mg/dL — ABNORMAL HIGH (ref 0–200)
HDL: 60 mg/dL (ref >40)
LDL Cholesterol: 130 mg/dL — ABNORMAL HIGH (ref 0–99)
Total CHOL/HDL Ratio: 3.6 RATIO
Triglycerides: 113 mg/dL (ref <150)
VLDL: 23 mg/dL (ref 0–40)

## 2019-07-30 ENCOUNTER — Telehealth: Payer: Self-pay | Admitting: General Practice

## 2019-07-30 ENCOUNTER — Ambulatory Visit (INDEPENDENT_AMBULATORY_CARE_PROVIDER_SITE_OTHER): Payer: PPO | Admitting: General Practice

## 2019-07-30 DIAGNOSIS — R5383 Other fatigue: Secondary | ICD-10-CM

## 2019-07-30 DIAGNOSIS — J4540 Moderate persistent asthma, uncomplicated: Secondary | ICD-10-CM

## 2019-07-30 DIAGNOSIS — R52 Pain, unspecified: Secondary | ICD-10-CM

## 2019-07-30 DIAGNOSIS — F3341 Major depressive disorder, recurrent, in partial remission: Secondary | ICD-10-CM

## 2019-07-30 DIAGNOSIS — M797 Fibromyalgia: Secondary | ICD-10-CM

## 2019-07-30 DIAGNOSIS — M8580 Other specified disorders of bone density and structure, unspecified site: Secondary | ICD-10-CM

## 2019-07-30 NOTE — Chronic Care Management (AMB) (Signed)
Chronic Care Management   Follow Up Note   07/30/2019 Name: Andrea Savage MRN: [id number] DOB: 04-Jan-1946  Referred by: Valerie Roys, DO Reason for referral : Chronic Care Management (Follow up: Asthma, Depression, Osteopenia- right leg pain and new concerns)   Andrea Savage is a 74 y.o. year old female who is a primary care patient of Valerie Roys, DO. The CCM team was consulted for assistance with chronic disease management and care coordination needs.    Review of patient status, including review of consultants reports, relevant laboratory and other test results, and collaboration with appropriate care team members and the patient's provider was performed as part of comprehensive patient evaluation and provision of chronic care management services.    SDOH (Social Determinants of Health) assessments performed: No See Care Plan activities for detailed interventions related to [facility])     Outpatient Encounter Medications as of 07/30/2019  Medication Sig Note  . albuterol (PROVENTIL HFA;VENTOLIN HFA) 108 (90 Base) MCG/ACT inhaler Inhale 2 puffs into the lungs every 6 (six) hours as needed for wheezing or shortness of breath. 05/31/2019: Needs a refill  . Ascorbic Acid (VITAMIN C) 100 MG tablet Take 100 mg by mouth daily.   Marland Kitchen aspirin EC 81 MG tablet Take 1 tablet (81 mg total) by mouth daily.   . beclomethasone (QVAR) 40 MCG/ACT inhaler Inhale 2 puffs into the lungs 2 (two) times daily. 05/31/2019: Needs a refil  . busPIRone (BUSPAR) 5 MG tablet Take 1 tablet (5 mg total) by mouth daily.   Marland Kitchen CALCIUM-MAGNESIUM-ZINC PO Take by mouth daily.   . cetirizine (ZYRTEC) 10 MG tablet Take 10 mg by mouth daily.   Marland Kitchen CINNAMON PO Take by mouth.   . citalopram (CELEXA) 40 MG tablet Take 1 tablet (20 mg total) by mouth daily.   . COLLAGEN PO Take by mouth.   . D-Ribose (RIBOSE, D,) POWD by Does not apply route.   . diphenhydrAMINE (BENADRYL) 25 MG tablet Take 25 mg by mouth daily.   .  Emollient (AVEENO POSITIVELY AGELESS BODY EX) Apply topically.   . Emollient (COLLAGEN EX) Apply topically.   Marland Kitchen ezetimibe (ZETIA) 10 MG tablet Take 1 tablet (10 mg total) by mouth daily.   . furosemide (LASIX) 20 MG tablet Take 1 tablet (20 mg total) by mouth daily.   . Ginger, Zingiber officinalis, (GINGER PO) Take by mouth.   Marland Kitchen KRILL OIL PO Take by mouth.   . levOCARNitine (CARNITINE PO) Take by mouth.   . metoprolol succinate (TOPROL-XL) 25 MG 24 hr tablet Take 0.5 tablets (12.5 mg total) by mouth daily.   . Multiple Vitamins-Minerals (MULTIVITAMIN/EXTRA VITAMIN D3 PO) Take by mouth.   . naproxen sodium (ALEVE) 220 MG tablet Take 440 mg by mouth.   . Nutritional Supplements (PROTEIN SUPPLEMENT 80% PO) Take by mouth.   . omega-3 acid ethyl esters (LOVAZA) 1 g capsule Take 1 g by mouth daily.   Marland Kitchen omeprazole (PRILOSEC) 40 MG capsule    . pantoprazole (PROTONIX) 40 MG tablet Take 40 mg by mouth daily.   . rosuvastatin (CRESTOR) 40 MG tablet Take 1 tablet (40 mg total) by mouth daily.   . TURMERIC PO Take by mouth.    No facility-administered encounter medications on file as of 07/30/2019.     Objective:  BP Readings from Last 3 Encounters:  07/08/19 128/76  06/20/19 (!) 144/72  01/21/19 128/68    Goals Addressed  This Visit's Progress   . RNCM: Pt- "I try to live a healthy lifestyle" (pt-stated)       CARE PLAN ENTRY (see longtitudinal plan of care for additional care plan information)  Current Barriers:  . Chronic Disease Management support, education, and care coordination needs related to Depression and Asthma   Clinical Goal(s) related to Depression and Asthma :  Over the next 120 days, patient will:  . Work with the care management team to address educational, disease management, and care coordination needs  . Begin or continue self health monitoring activities as directed today  improve activity level as tolerated, and monitor for changes in allergies and  breathing issues related to Asthma  . Call provider office for new or worsened signs and symptoms Oxygen saturation lower than established parameter, Shortness of breath, and New or worsened symptom related to depression or change in cardiac functions . Call care management team with questions or concerns . Verbalize basic understanding of patient centered plan of care established today  Interventions related to Depression and Asthma :  . Evaluation of current treatment plans and patient's adherence to plan as established by provider . Assessed patient understanding of disease states.  The patient has a good understanding of her health conditions and feels they are under good control at this time.  . Assessed patient's education and care coordination needs: The patient states that she needs refills for her inhalers. The patient ask if Dr. [name] could refill her Ventolin (Albuterol, Proventil HFA; Ventolin HFA 108 mcg/ACT inhaler) inhaler and Qvar.  The patient is out of both and is no longer going to the pulmonary doctor at [facility]. Will collaborate with Dr. [name] concerning the patients request. Involve pharmacy support as needed. The patient states she has not heard back from the pharmacy. Wanted to know if this could be addressed again with pcp. Also ask for a refill for prescription cough medication.  The patient verbalized she has not had any in over a year but needs a refill because sometimes she starts coughing and this is the only thing that helps her stop. Will send in basket message to pcp and pharmacist for assistance.  . Provided disease specific education to patient: the patient is following a heart healthy diet and loves to cook. . Evaluation of recent Echocardiogram. The patients EF is 60-65%.  The patient verbalized they told her there is nothing wrong with her heart.  Nash Dimmer with appropriate clinical care team members regarding patient needs- knows about LCSW and Pharmacist.    Patient Self Care Activities related to Depression and Asthma :  . Patient is unable to independently self-manage chronic health conditions  Please see past updates related to this goal by clicking on the "Past Updates" button in the selected goal      . RNCM: Pt-"My right leg is still hurting" (pt-stated)       [place] (see longitudinal plan of care for additional care plan information)  Current Barriers:  Marland Kitchen Knowledge Deficits related to etiology of right leg pain and staying tired "all" the time. The patient feels there is something wrong with her thyroid even though she has had testing and it shows no issues. She has a significant family history of thyroid issues.  . Care Coordination needs related to right leg pain and chronic fatigue in a patient with Osteopenia and other chronic conditions (disease states)  Nurse Case Manager Clinical Goal(s):  Marland Kitchen Over the next 120 days, patient will  verbalize understanding of plan for support and education related to pain in right leg and chronic fatigue  . Over the next 120 days, patient will work with pcp and CCM team  to address needs related to right leg pain and chronic fatigue  . Over the next 120 days, patient will attend all scheduled medical appointments: no upcoming appointments with the pcp but ask the patient when she returned from her vacation to call the office and make an appointment.   Interventions:  . Inter-disciplinary care team collaboration (see longitudinal plan of care) . Advised patient to discuss her concerns about the continued leg pain and discomfort and chronic fatigue with the pcp.  The patient believes there is a correlation with her thyroid to these problems. Has a significant family history of thyroid problems.  She is frustrated and doesn't understand why she feels the way she does. Sometimes her leg feels heavy like she can't even move it. She verbalized that she has not found anything to relieve the pain.   Denies use of DME or falls.  . Provided education to patient re: talking to pcp about recommendations. The patient states her niece was having the same symptoms as she does and got involved with a holistic provider and is now doing great. Education and support given. The patient states her mother, her siblings, her daughters, her nieces and other family members all have or had thyroid issues. She has been tested and they can not find anything wrong with her thyroid but she feels if she could go on a thyroid supplement it would make a huge difference in the way she feels. She states that she has to drink a lot of coffee and energy drinks to keep going.  Marland Kitchen Collaborated with pcp and CCM pharmacist  regarding right leg pain and chronic fatigue.  Marland Kitchen Discussed plans with patient for ongoing care management follow up and provided patient with direct contact information for care management team . Reviewed scheduled/upcoming provider appointments including: the patient to make an appointment to come in to be seen by the pcp upon arrival back home from vacation.   Patient Self Care Activities:  . Patient verbalizes understanding of plan to discuss her leg pain and chronic fatigue with the pcp for treatment and recommendations . Attends all scheduled provider appointments . Calls provider office for new concerns or questions . Unable to independently manage right leg pain and chronic fatigue  Initial goal documentation         Plan:   The care management team will reach out to the patient again over the next 60 days.    Noreene Larsson RN, MSN, [facility] Mobile: [phone]

## 2019-07-30 NOTE — Patient Instructions (Signed)
Visit Information  Goals Addressed            This Visit's Progress   . RNCM: Pt- "I try to live a healthy lifestyle" (pt-stated)       CARE PLAN ENTRY (see longtitudinal plan of care for additional care plan information)  Current Barriers:  . Chronic Disease Management support, education, and care coordination needs related to Depression and Asthma   Clinical Goal(s) related to Depression and Asthma :  Over the next 120 days, patient will:  . Work with the care management team to address educational, disease management, and care coordination needs  . Begin or continue self health monitoring activities as directed today  improve activity level as tolerated, and monitor for changes in allergies and breathing issues related to Asthma  . Call provider office for new or worsened signs and symptoms Oxygen saturation lower than established parameter, Shortness of breath, and New or worsened symptom related to depression or change in cardiac functions . Call care management team with questions or concerns . Verbalize basic understanding of patient centered plan of care established today  Interventions related to Depression and Asthma :  . Evaluation of current treatment plans and patient's adherence to plan as established by provider . Assessed patient understanding of disease states.  The patient has a good understanding of her health conditions and feels they are under good control at this time.  . Assessed patient's education and care coordination needs: The patient states that she needs refills for her inhalers. The patient ask if Dr. [name] could refill her Ventolin (Albuterol, Proventil HFA; Ventolin HFA 108 mcg/ACT inhaler) inhaler and Qvar.  The patient is out of both and is no longer going to the pulmonary doctor at [facility]. Will collaborate with Dr. [name] concerning the patients request. Involve pharmacy support as needed. The patient states she has not heard back from the pharmacy.  Wanted to know if this could be addressed again with pcp. Also ask for a refill for prescription cough medication.  The patient verbalized she has not had any in over a year but needs a refill because sometimes she starts coughing and this is the only thing that helps her stop. Will send in basket message to pcp and pharmacist for assistance.  . Provided disease specific education to patient: the patient is following a heart healthy diet and loves to cook. . Evaluation of recent Echocardiogram. The patients EF is 60-65%.  The patient verbalized they told her there is nothing wrong with her heart.  Nash Dimmer with appropriate clinical care team members regarding patient needs- knows about LCSW and Pharmacist.   Patient Self Care Activities related to Depression and Asthma :  . Patient is unable to independently self-manage chronic health conditions  Please see past updates related to this goal by clicking on the "Past Updates" button in the selected goal      . RNCM: Pt-"My right leg is still hurting" (pt-stated)       [place] (see longitudinal plan of care for additional care plan information)  Current Barriers:  Marland Kitchen Knowledge Deficits related to etiology of right leg pain and staying tired "all" the time. The patient feels there is something wrong with her thyroid even though she has had testing and it shows no issues. She has a significant family history of thyroid issues.  . Care Coordination needs related to right leg pain and chronic fatigue in a patient with Osteopenia and other chronic conditions (disease states)  Nurse Case Manager Clinical Goal(s):  Marland Kitchen Over the next 120 days, patient will verbalize understanding of plan for support and education related to pain in right leg and chronic fatigue  . Over the next 120 days, patient will work with pcp and CCM team  to address needs related to right leg pain and chronic fatigue  . Over the next 120 days, patient will attend all  scheduled medical appointments: no upcoming appointments with the pcp but ask the patient when she returned from her vacation to call the office and make an appointment.   Interventions:  . Inter-disciplinary care team collaboration (see longitudinal plan of care) . Advised patient to discuss her concerns about the continued leg pain and discomfort and chronic fatigue with the pcp.  The patient believes there is a correlation with her thyroid to these problems. Has a significant family history of thyroid problems.  She is frustrated and doesn't understand why she feels the way she does. Sometimes her leg feels heavy like she can't even move it. She verbalized that she has not found anything to relieve the pain.  Denies use of DME or falls.  . Provided education to patient re: talking to pcp about recommendations. The patient states her niece was having the same symptoms as she does and got involved with a holistic provider and is now doing great. Education and support given. The patient states her mother, her siblings, her daughters, her nieces and other family members all have or had thyroid issues. She has been tested and they can not find anything wrong with her thyroid but she feels if she could go on a thyroid supplement it would make a huge difference in the way she feels. She states that she has to drink a lot of coffee and energy drinks to keep going.  Marland Kitchen Collaborated with pcp and CCM pharmacist  regarding right leg pain and chronic fatigue.  Marland Kitchen Discussed plans with patient for ongoing care management follow up and provided patient with direct contact information for care management team . Reviewed scheduled/upcoming provider appointments including: the patient to make an appointment to come in to be seen by the pcp upon arrival back home from vacation.   Patient Self Care Activities:  . Patient verbalizes understanding of plan to discuss her leg pain and chronic fatigue with the pcp for treatment  and recommendations . Attends all scheduled provider appointments . Calls provider office for new concerns or questions . Unable to independently manage right leg pain and chronic fatigue  Initial goal documentation        Patient verbalizes understanding of instructions provided today.   The care management team will reach out to the patient again over the next 60 days.   Noreene Larsson RN, MSN, [facility] Mobile: [phone]

## 2019-08-07 ENCOUNTER — Ambulatory Visit: Payer: PPO | Admitting: Physician Assistant

## 2019-08-07 ENCOUNTER — Ambulatory Visit: Payer: PPO

## 2019-08-12 ENCOUNTER — Ambulatory Visit: Payer: PPO

## 2019-08-12 ENCOUNTER — Ambulatory Visit: Payer: PPO | Admitting: Family Medicine

## 2019-08-13 NOTE — Progress Notes (Signed)
Office Visit    Patient Name: Andrea Savage Date of Encounter: 08/14/2019  Primary Care Provider:  Valerie Roys, DO Primary Cardiologist:  Nelva Bush, MD Electrophysiologist:  None   Chief Complaint    Andrea Savage is a 74 y.o. female with a hx of PSVT, HLD, HFpEF, palpitations/PSVT, recurrent syncope/near syncope, possible asthma, fibromyalgia, depression, postherpetic neuralgia presents today for follow up after medication changes.    Past Medical History    Past Medical History:  Diagnosis Date  . Asthma   . Depression   . Fibromyalgia   . GERD (gastroesophageal reflux disease)   . H/O scarlet fever    as child  . Heart murmur    s/p scarlet fever as child  . IBS (irritable bowel syndrome)   . Low bone density   . Osteopenia   . Reflux   . Rosacea   . Wears hearing aid in both ears    Past Surgical History:  Procedure Laterality Date  . ABDOMINAL HYSTERECTOMY  2009   Total  . BREAST EXCISIONAL BIOPSY Left 1979   NEG  . COLONOSCOPY  [id number]   repeat 10 years  . EXCISION MORTON'S NEUROMA Left 09/21/2017   Procedure: EXCISION MORTON'S NEUROMA;  Surgeon: Albertine Patricia, DPM;  Location: [facility];  Service: Podiatry;  Laterality: Left;  . FOOT SURGERY Right 1990  . HAND SURGERY Right   . LAPAROSCOPIC OOPHERECTOMY    . MINOR HARDWARE REMOVAL Left 11/22/2018   Procedure: REMOVAL SCREW;DEEP 2nd and 3RD LEFT CAPSULE RELEASE OF THIRD METATARSAL PHALANGEAL JOINT LEFT, TENDON LENGTHING THIRD TOE LEFT AND CORTISONE INJECTION LEFT;  Surgeon: Albertine Patricia, DPM;  Location: [facility];  Service: Podiatry;  Laterality: Left;  LMA OR IVA LOCAL  . TONSILLECTOMY    . WEIL OSTEOTOMY Left 09/21/2017   Procedure: WEIL OSTEOTOMY-2ND & 3RD TOES;  Surgeon: Albertine Patricia, DPM;  Location: [place];  Service: Podiatry;  Laterality: Left;  LMA WITH LOCAL MINI MONSTER DR TROXLER WANTS TED HOSE ON PATIENT'S RIGHT LEG     Allergies  Allergies  Allergen Reactions  . Compazine [Prochlorperazine] Other (See Comments)    Grand mal seizure  . Dust Mite Extract     History of Present Illness    Andrea Savage is a 74 y.o. female with a hx of PSVT, HLD, HFpEF, palpitations/PSVT, recurrent syncope/near syncope, possible asthma, fibromyalgia, depression, postherpetic neuralgia. She was last seen 07/08/19 by Elenor Quinones, PA.  Echo 09/2017 with normal LVEF and mild MR. Lexiscan myoview 09/2017 low risk study with no evidence of ischemia . Long term monitor 10/2017 with rare PAC/PVC and several episodes of PSVT. She was subsequently started on Metoprolol with improvement.   Echo 06/2019 with LVEF 60-65%, gr2DD, mild to moderate MR, mildly elevated PASP, and RA pressure 1mmHg. She was started on Lasix 20mg  daily. Lower extremity ABI ordered due to RLE weakness with results 06/27/19 with bilateral LE ABI normal.   She has previously been on Crestor 40mg  daily with reported myalgias, this was stopped. At last clinic visit she was recommended to start Zetia 10mg  daily.   Last clinic visit with multiple concerns including fatigue, statin intolerance, cough, leg pain. Also reported recent mechanical fall without dizziness, lightheadedness, LOC. Her cough was noted to be in the setting of not using her inhaler or cough syrup. She was encouraged to follow up with her PCP regarding possible referral to rheumatology vs endocrinology.   Tells me her body  aches have resolved since being off the Crestor. Started taking Zetia approximately the end of May. Tolerating well.   Reports continued leg pain that is the same as previous. Started from her knee and radiating down her leg as a "toothache" like pain. Tells me she had an appointment with primary care but plans to rescheduled as she had a friend visiting from [place] and had to cancel.  Reports fatigue is unchanged.  Tells me she plans to establish with a holistic provider, per her  description. She is concerned about autoimmune disease or thyroid disorder due to family history.   Reports no shortness of breath at rest. DOE is stable at her baseline. Reports no chest pain, pressure, or tightness. No  orthopnea, PND. Reports intermittent LE edema if she sits or stands a long time - she manages this with PRN lasix. Reports no palpitations.   EKGs/Labs/Other Studies Reviewed:   The following studies were reviewed today:  Echo 06/21/19  1. Left ventricular ejection fraction, by estimation, is 60 to 65%. The  left ventricle has normal function. The left ventricle has no regional  wall motion abnormalities. Left ventricular diastolic parameters are  consistent with Grade II diastolic  dysfunction (pseudonormalization).   2. Right ventricular systolic function is normal. The right ventricular  size is normal. There is mildly elevated pulmonary artery systolic  pressure.   3. The mitral valve is normal in structure. Mild to moderate mitral valve  regurgitation.   4. The inferior vena cava is dilated in size with <50% respiratory  variability, suggesting right atrial pressure of 15 mmHg.    LE Arterial study 06/27/2019 Summary:  Right: Resting right ankle-brachial index is within normal range.  No evidence of significant right lower extremity arterial disease. The  right toe-brachial index is normal.  Left: Resting left ankle-brachial index is within normal range.  No evidence of significant left lower extremity arterial disease. The left  toe-brachial index is normal.    Zio 10/2017  The patient was monitored for 12 days, 19 hours.  The predominant rhythm was sinus with an average rate of 76 bpm (range 53-135 bpm in sinus).  There were rare PAC's and PVC's.  33 atrial runs were observed, with a maximal rate of 203 bpm. The longest run lasted 17.3 seconds.  No sustained arrhythmia or prolonged pause was identified.  Patient triggered events correspond to sinus  rhythm, PAC's, and atrial runs. Predominantly sinus rhythm with rare PAC's and PVC's.  Multiple atrial runs lasting up to 17 seconds were noted, consistent with paroxysmal supraventricular tachycardia.  09/2017 Lexiscan  No T wave inversion was noted during stress.  The study is normal.  This is a low risk study.  The left ventricular ejection fraction is hyperdynamic (>65%).  EKG:  EKG is ordered today.  The ekg ordered today demonstrates NSR 66 bpm with no acute ST/T wave changes.   Recent Labs: 06/20/2019: Hemoglobin 13.7; Platelets 188 07/01/2019: BUN 18; Creatinine, Ser 0.83; Potassium 4.3; Sodium 139 07/08/2019: TSH 1.840 07/26/2019: ALT 16   Recent Lipid Panel    Component Value Date/Time   CHOL 213 (H) 07/26/2019 0906   CHOL 228 (H) 06/20/2019 1053   TRIG 113 07/26/2019 0906   HDL 60 07/26/2019 0906   HDL 59 06/20/2019 1053   CHOLHDL 3.6 07/26/2019 0906   VLDL 23 07/26/2019 0906   LDLCALC 130 (H) 07/26/2019 0906   LDLCALC 142 (H) 06/20/2019 1053   LDLDIRECT 149 (H) 06/20/2019 1053  Home Medications   Current Meds  Medication Sig  . Ascorbic Acid (VITAMIN C) 100 MG tablet Take 100 mg by mouth daily.  Marland Kitchen aspirin EC 81 MG tablet Take 1 tablet (81 mg total) by mouth daily.  . busPIRone (BUSPAR) 5 MG tablet Take 1 tablet (5 mg total) by mouth daily.  . cetirizine (ZYRTEC) 10 MG tablet Take 10 mg by mouth daily.  Marland Kitchen CINNAMON PO Take by mouth. Uses in shakes 2-3 times per week  . citalopram (CELEXA) 40 MG tablet Take 1 tablet (20 mg total) by mouth daily.  Marland Kitchen ezetimibe (ZETIA) 10 MG tablet Take 1 tablet (10 mg total) by mouth daily.  . furosemide (LASIX) 20 MG tablet Take 1 tablet (20 mg total) by mouth daily.  . metoprolol succinate (TOPROL-XL) 25 MG 24 hr tablet Take 0.5 tablets (12.5 mg total) by mouth daily.  . naproxen sodium (ALEVE) 220 MG tablet Take 440 mg by mouth at bedtime.   Marland Kitchen omeprazole (PRILOSEC) 40 MG capsule Take 40 mg by mouth daily.   Marland Kitchen Specialty  Vitamins Products (MAGNESIUM, AMINO ACID CHELATE,) 133 MG tablet Take 2-3 tablets by mouth as directed. Takes most evenings for leg pain  . TURMERIC PO Take by mouth. Does not take daily  . [DISCONTINUED] Emollient (COLLAGEN EX) Apply topically. Uses in shakes 2-3 times per week      Review of Systems     Review of Systems  Constitution: Positive for malaise/fatigue. Negative for chills and fever.  Cardiovascular: Positive for dyspnea on exertion. Negative for chest pain, irregular heartbeat, leg swelling, near-syncope, orthopnea, palpitations and syncope.  Respiratory: Negative for cough, shortness of breath and wheezing.   Musculoskeletal:       Leg pain R>L  Gastrointestinal: Negative for melena, nausea and vomiting.  Genitourinary: Negative for hematuria.  Neurological: Negative for dizziness, light-headedness and weakness.   All other systems reviewed and are otherwise negative except as noted above.  Physical Exam    VS:  BP 110/78 (BP Location: Left Arm, Patient Position: Sitting, Cuff Size: Normal)   Pulse 66   Ht 5' 4.5" (1.638 m)   Wt 159 lb 4 oz (72.2 kg)   SpO2 96%   BMI 26.91 kg/m  , BMI Body mass index is 26.91 kg/m. GEN: Well nourished, well developed, in no acute distress. HEENT: normal. Neck: Supple, no JVD, carotid bruits, or masses. Cardiac: RRR, no murmurs, rubs, or gallops. No clubbing, cyanosis, edema.  Radials/DP/PT 2+ and equal bilaterally.  Respiratory:  Respirations regular and unlabored, clear to auscultation bilaterally. GI: Soft, nontender, nondistended. MS: No deformity or atrophy. Skin: Warm and dry, no rash. Varicose veins bilateral LE.  Neuro:  Strength and sensation are intact. Psych: Normal affect.  Assessment & Plan  1.  PSVT/palpitations -reports no recurrence of palpitations while on Toprol.  Encouraged to continue to avoid caffeine, alcohol.  Continue metoprolol succinate 12.5 mg daily.  2. HLD with statin intolerance -previously  intolerant of Crestor.  Tolerating Zetia 10 mg daily have difficulty.  We will plan for repeat lipid panel and liver function at the end of July for reassessment.  If LDL remains greater than 100, consider PCSK9i vs Nexlizet.  3. HFpEF -euvolemic and well compensated on exam.  Has been taking Lasix as needed with good response.  Continue Lasix 20 mg daily as needed, Toprol 12.5 mg daily.  No ACE/ARB/ARNI due to low normal blood pressure.  4. DOE chronic -stable at baseline.  Reports she has been  off her inhalers as she has been unable to have a refill, encouraged to follow-up with primary care.  No signs of volume overload on exam.  5. Leg pain -recent lower extremity duplex with no evidence of PAD.  Encouraged to follow-up with her primary care provider.  Disposition: Follow up in 4 month(s) with Dr. Saunders Revel or APP.    Loel Dubonnet, NP 08/14/2019, 9:58 AM

## 2019-08-14 ENCOUNTER — Encounter: Payer: Self-pay | Admitting: Family

## 2019-08-14 ENCOUNTER — Other Ambulatory Visit: Payer: Self-pay

## 2019-08-14 ENCOUNTER — Ambulatory Visit (INDEPENDENT_AMBULATORY_CARE_PROVIDER_SITE_OTHER): Payer: PPO | Admitting: Family

## 2019-08-14 VITALS — BP 110/78 | HR 66 | Ht 64.5 in | Wt 159.2 lb

## 2019-08-14 DIAGNOSIS — Z79899 Other long term (current) drug therapy: Secondary | ICD-10-CM | POA: Diagnosis not present

## 2019-08-14 DIAGNOSIS — E785 Hyperlipidemia, unspecified: Secondary | ICD-10-CM | POA: Diagnosis not present

## 2019-08-14 DIAGNOSIS — J301 Allergic rhinitis due to pollen: Secondary | ICD-10-CM | POA: Diagnosis not present

## 2019-08-14 DIAGNOSIS — I471 Supraventricular tachycardia: Secondary | ICD-10-CM

## 2019-08-14 NOTE — Patient Instructions (Addendum)
Medication Instructions:  No medication changes today.   *If you need a refill on your cardiac medications before your next appointment, please call your pharmacy*   Lab Work: Your physician recommends that you return for FASTING lab work the last week of July or first week of August for fasting lipid panel at the [facility].  - come to the [place] entrance at [facility] - 1st desk on the right (Registration) past the screening table to check in - labs are done on a walk-in basis Monday- Friday (7:30 am- 5:30 pm) - you will need to be FASTING for 8 hours prior to your lab draw (no food or drink, except you may have water/ black coffee up until the time of your labs).   If you have labs (blood work) drawn today and your tests are completely normal, you will receive your results only by: Marland Kitchen MyChart Message (if you have MyChart) OR . A paper copy in the mail If you have any lab test that is abnormal or we need to change your treatment, we will call you to review the results.  Testing/Procedures: Your EKG today shows normal sinus rhythm which is a good result.  Follow-Up: At [facility], you and your health needs are our priority.  As part of our continuing mission to provide you with exceptional heart care, we have created designated Provider Care Teams.  These Care Teams include your primary Cardiologist (physician) and Advanced Practice Providers (APPs -  Physician Assistants and Nurse Practitioners) who all work together to provide you with the care you need, when you need it.  We recommend signing up for the patient portal called "MyChart".  Sign up information is provided on this After Visit Summary.  MyChart is used to connect with patients for Virtual Visits (Telemedicine).  Patients are able to view lab/test results, encounter notes, upcoming appointments, etc.  Non-urgent messages can be sent to your provider as well.   To learn more about what you can do with MyChart, go to  [email or web address].    Your next appointment:   4 month(s)  The format for your next appointment:   In Person  Provider:   You may see Nelva Bush, MD or one of the following Advanced Practice Providers on your designated Care Team:    Murray Hodgkins, NP  Christell Faith, PA-C  Marrianne Mood, PA-C  Laurann Montana, NP   Other Instructions  Recommend keeping your legs elevated and continuing to eat a low salt diet to prevent leg swelling.

## 2019-08-21 DIAGNOSIS — J301 Allergic rhinitis due to pollen: Secondary | ICD-10-CM | POA: Diagnosis not present

## 2019-08-23 ENCOUNTER — Other Ambulatory Visit: Payer: Self-pay

## 2019-08-23 ENCOUNTER — Other Ambulatory Visit: Payer: Self-pay | Admitting: Family Medicine

## 2019-08-23 MED ORDER — ALBUTEROL SULFATE HFA 108 (90 BASE) MCG/ACT IN AERS: 2.0000 | INHALATION_SPRAY | Freq: Four times a day (QID) | RESPIRATORY_TRACT | 0 refills | Status: AC | PRN

## 2019-09-04 DIAGNOSIS — J301 Allergic rhinitis due to pollen: Secondary | ICD-10-CM | POA: Diagnosis not present

## 2019-09-06 DIAGNOSIS — M1711 Unilateral primary osteoarthritis, right knee: Secondary | ICD-10-CM | POA: Diagnosis not present

## 2019-09-06 DIAGNOSIS — M65341 Trigger finger, right ring finger: Secondary | ICD-10-CM | POA: Diagnosis not present

## 2019-09-11 DIAGNOSIS — J301 Allergic rhinitis due to pollen: Secondary | ICD-10-CM | POA: Diagnosis not present

## 2019-09-13 ENCOUNTER — Ambulatory Visit (INDEPENDENT_AMBULATORY_CARE_PROVIDER_SITE_OTHER): Payer: PPO

## 2019-09-13 VITALS — Ht 64.5 in | Wt 156.0 lb

## 2019-09-13 DIAGNOSIS — Z0000 Encounter for general adult medical examination without abnormal findings: Secondary | ICD-10-CM

## 2019-09-13 NOTE — Patient Instructions (Signed)
Andrea Savage , Thank you for taking time to come for your Medicare Wellness Visit. I appreciate your ongoing commitment to your health goals. Please review the following plan we discussed and let me know if I can assist you in the future.   Screening recommendations/referrals: Colonoscopy: completed 01/25/2011, due 11/202/2022 Mammogram: completed 12/26/2017, due now Bone Density: completed 06/05/2008 Recommended yearly ophthalmology/optometry visit for glaucoma screening and checkup Recommended yearly dental visit for hygiene and checkup  Vaccinations: Influenza vaccine: completed 12/18/2018, due 10/06/2019 Pneumococcal vaccine: completed 11/30/2017 Tdap vaccine: completed 12/01/2008 Shingles vaccine: discussed   Covid-19:04/17/2019, 05/08/2019  Advanced directives: Please bring a copy of your POA (Power of Attorney) and/or Living Will to your next appointment.   Conditions/risks identified: none  Next appointment: Follow up in one year for your annual wellness visit    Preventive Care 65 Years and Older, Female Preventive care refers to lifestyle choices and visits with your health care provider that can promote health and wellness. What does preventive care include?  A yearly physical exam. This is also called an annual well check.  Dental exams once or twice a year.  Routine eye exams. Ask your health care provider how often you should have your eyes checked.  Personal lifestyle choices, including:  Daily care of your teeth and gums.  Regular physical activity.  Eating a healthy diet.  Avoiding tobacco and drug use.  Limiting alcohol use.  Practicing safe sex.  Taking low-dose aspirin every day.  Taking vitamin and mineral supplements as recommended by your health care provider. What happens during an annual well check? The services and screenings done by your health care provider during your annual well check will depend on your age, overall health, lifestyle risk  factors, and family history of disease. Counseling  Your health care provider may ask you questions about your:  Alcohol use.  Tobacco use.  Drug use.  Emotional well-being.  Home and relationship well-being.  Sexual activity.  Eating habits.  History of falls.  Memory and ability to understand (cognition).  Work and work Statistician.  Reproductive health. Screening  You may have the following tests or measurements:  Height, weight, and BMI.  Blood pressure.  Lipid and cholesterol levels. These may be checked every 5 years, or more frequently if you are over 32 years old.  Skin check.  Lung cancer screening. You may have this screening every year starting at age 48 if you have a 30-pack-year history of smoking and currently smoke or have quit within the past 15 years.  Fecal occult blood test (FOBT) of the stool. You may have this test every year starting at age 59.  Flexible sigmoidoscopy or colonoscopy. You may have a sigmoidoscopy every 5 years or a colonoscopy every 10 years starting at age 60.  Hepatitis C blood test.  Hepatitis B blood test.  Sexually transmitted disease (STD) testing.  Diabetes screening. This is done by checking your blood sugar (glucose) after you have not eaten for a while (fasting). You may have this done every 1-3 years.  Bone density scan. This is done to screen for osteoporosis. You may have this done starting at age 61.  Mammogram. This may be done every 1-2 years. Talk to your health care provider about how often you should have regular mammograms. Talk with your health care provider about your test results, treatment options, and if necessary, the need for more tests. Vaccines  Your health care provider may recommend certain vaccines, such as:  Influenza  vaccine. This is recommended every year.  Tetanus, diphtheria, and acellular pertussis (Tdap, Td) vaccine. You may need a Td booster every 10 years.  Zoster vaccine. You  may need this after age 58.  Pneumococcal 13-valent conjugate (PCV13) vaccine. One dose is recommended after age 64.  Pneumococcal polysaccharide (PPSV23) vaccine. One dose is recommended after age 54. Talk to your health care provider about which screenings and vaccines you need and how often you need them. This information is not intended to replace advice given to you by your health care provider. Make sure you discuss any questions you have with your health care provider. Document Released: 03/20/2015 Document Revised: 11/11/2015 Document Reviewed: 12/23/2014 Elsevier Interactive Patient Education  2017 [place] Prevention in the Home Falls can cause injuries. They can happen to people of all ages. There are many things you can do to make your home safe and to help prevent falls. What can I do on the outside of my home?  Regularly fix the edges of walkways and driveways and fix any cracks.  Remove anything that might make you trip as you walk through a door, such as a raised step or threshold.  Trim any bushes or trees on the path to your home.  Use bright outdoor lighting.  Clear any walking paths of anything that might make someone trip, such as rocks or tools.  Regularly check to see if handrails are loose or broken. Make sure that both sides of any steps have handrails.  Any raised decks and porches should have guardrails on the edges.  Have any leaves, snow, or ice cleared regularly.  Use sand or salt on walking paths during winter.  Clean up any spills in your garage right away. This includes oil or grease spills. What can I do in the bathroom?  Use night lights.  Install grab bars by the toilet and in the tub and shower. Do not use towel bars as grab bars.  Use non-skid mats or decals in the tub or shower.  If you need to sit down in the shower, use a plastic, non-slip stool.  Keep the floor dry. Clean up any water that spills on the floor as soon as  it happens.  Remove soap buildup in the tub or shower regularly.  Attach bath mats securely with double-sided non-slip rug tape.  Do not have throw rugs and other things on the floor that can make you trip. What can I do in the bedroom?  Use night lights.  Make sure that you have a light by your bed that is easy to reach.  Do not use any sheets or blankets that are too big for your bed. They should not hang down onto the floor.  Have a firm chair that has side arms. You can use this for support while you get dressed.  Do not have throw rugs and other things on the floor that can make you trip. What can I do in the kitchen?  Clean up any spills right away.  Avoid walking on wet floors.  Keep items that you use a lot in easy-to-reach places.  If you need to reach something above you, use a strong step stool that has a grab bar.  Keep electrical cords out of the way.  Do not use floor polish or wax that makes floors slippery. If you must use wax, use non-skid floor wax.  Do not have throw rugs and other things on the floor that can  make you trip. What can I do with my stairs?  Do not leave any items on the stairs.  Make sure that there are handrails on both sides of the stairs and use them. Fix handrails that are broken or loose. Make sure that handrails are as long as the stairways.  Check any carpeting to make sure that it is firmly attached to the stairs. Fix any carpet that is loose or worn.  Avoid having throw rugs at the top or bottom of the stairs. If you do have throw rugs, attach them to the floor with carpet tape.  Make sure that you have a light switch at the top of the stairs and the bottom of the stairs. If you do not have them, ask someone to add them for you. What else can I do to help prevent falls?  Wear shoes that:  Do not have high heels.  Have rubber bottoms.  Are comfortable and fit you well.  Are closed at the toe. Do not wear sandals.  If  you use a stepladder:  Make sure that it is fully opened. Do not climb a closed stepladder.  Make sure that both sides of the stepladder are locked into place.  Ask someone to hold it for you, if possible.  Clearly mark and make sure that you can see:  Any grab bars or handrails.  First and last steps.  Where the edge of each step is.  Use tools that help you move around (mobility aids) if they are needed. These include:  Canes.  Walkers.  Scooters.  Crutches.  Turn on the lights when you go into a dark area. Replace any light bulbs as soon as they burn out.  Set up your furniture so you have a clear path. Avoid moving your furniture around.  If any of your floors are uneven, fix them.  If there are any pets around you, be aware of where they are.  Review your medicines with your doctor. Some medicines can make you feel dizzy. This can increase your chance of falling. Ask your doctor what other things that you can do to help prevent falls. This information is not intended to replace advice given to you by your health care provider. Make sure you discuss any questions you have with your health care provider. Document Released: 12/18/2008 Document Revised: 07/30/2015 Document Reviewed: 03/28/2014 Elsevier Interactive Patient Education  2017 [employer].

## 2019-09-13 NOTE — Progress Notes (Signed)
I connected with Andrea Savage today by telephone and verified that I am speaking with the correct person using two identifiers. Location patient: home Location provider: work Persons participating in the virtual visit: Zaakirah, [place]ner LPN.   I discussed the limitations, risks, security and privacy concerns of performing an evaluation and management service by telephone and the availability of in person appointments. I also discussed with the patient that there may be a patient responsible charge related to this service. The patient expressed understanding and verbally consented to this telephonic visit.    Interactive audio and video telecommunications were attempted between this provider and patient, however failed, due to patient having technical difficulties OR patient did not have access to video capability.  We continued and completed visit with audio only.  Vital signs maybe patient reported of missing.     Subjective:   Andrea Savage is a 74 y.o. female who presents for Medicare Annual (Subsequent) preventive examination.  Review of Systems     Cardiac Risk Factors include: advanced age (>33men, >47 women);dyslipidemia;sedentary lifestyle     Objective:    Today's Vitals   09/13/19 0942 09/13/19 0943  Weight: 156 lb (70.8 kg)   Height: 5' 4.5" (1.638 m)   PainSc:  2    Body mass index is 26.36 kg/m.  Advanced Directives 09/13/2019 11/22/2018 11/30/2017 09/21/2017 11/30/2016 06/29/2015  Does Patient Have a Medical Advance Directive? Yes No Yes No No No  Type of Paramedic of [place];Living will - [place];Living will - - -  Copy of [place] in Chart? No - copy requested - No - copy requested - - -  Would patient like information on creating a medical advance directive? - No - Patient declined - No - Patient declined Yes (MAU/Ambulatory/Procedural Areas - Information given) Yes - Educational  materials given    Current Medications (verified) Outpatient Encounter Medications as of 09/13/2019  Medication Sig  . albuterol (VENTOLIN HFA) 108 (90 Base) MCG/ACT inhaler Inhale 2 puffs into the lungs every 6 (six) hours as needed for wheezing or shortness of breath.  . Ascorbic Acid (VITAMIN C) 100 MG tablet Take 100 mg by mouth daily.  Marland Kitchen aspirin EC 81 MG tablet Take 1 tablet (81 mg total) by mouth daily.  . busPIRone (BUSPAR) 5 MG tablet Take 1 tablet (5 mg total) by mouth daily.  Marland Kitchen CALCIUM-MAGNESIUM-ZINC PO Take by mouth daily.  . cetirizine (ZYRTEC) 10 MG tablet Take 10 mg by mouth daily.  . citalopram (CELEXA) 40 MG tablet Take 1 tablet (20 mg total) by mouth daily.  . COLLAGEN PO Take by mouth. Takes 2-3 times per week in shakes  . ezetimibe (ZETIA) 10 MG tablet Take 1 tablet (10 mg total) by mouth daily.  . furosemide (LASIX) 20 MG tablet Take 1 tablet (20 mg total) by mouth daily.  . metoprolol succinate (TOPROL-XL) 25 MG 24 hr tablet Take 0.5 tablets (12.5 mg total) by mouth daily.  . naproxen sodium (ALEVE) 220 MG tablet Take 440 mg by mouth at bedtime.   Marland Kitchen omeprazole (PRILOSEC) 40 MG capsule Take 40 mg by mouth daily.   Marland Kitchen Specialty Vitamins Products (MAGNESIUM, AMINO ACID CHELATE,) 133 MG tablet Take 2-3 tablets by mouth as directed. Takes most evenings for leg pain  . beclomethasone (QVAR) 40 MCG/ACT inhaler Inhale 2 puffs into the lungs 2 (two) times daily.  Marland Kitchen CINNAMON PO Take by mouth. Uses in shakes 2-3 times per  week  . Ginger, Zingiber officinalis, (GINGER PO) Take by mouth.  . Multiple Vitamins-Minerals (MULTIVITAMIN/EXTRA VITAMIN D3 PO) Take by mouth. (Patient not taking: Reported on 09/13/2019)  . TURMERIC PO Take by mouth. Does not take daily   No facility-administered encounter medications on file as of 09/13/2019.    Allergies (verified) Compazine [prochlorperazine] and Dust mite extract   History: Past Medical History:  Diagnosis Date  . Asthma   . Depression    . Fibromyalgia   . GERD (gastroesophageal reflux disease)   . H/O scarlet fever    as child  . Heart murmur    s/p scarlet fever as child  . IBS (irritable bowel syndrome)   . Low bone density   . Osteopenia   . Reflux   . Rosacea   . Wears hearing aid in both ears    Past Surgical History:  Procedure Laterality Date  . ABDOMINAL HYSTERECTOMY  2009   Total  . BREAST EXCISIONAL BIOPSY Left 1979   NEG  . COLONOSCOPY  [id number]   repeat 10 years  . EXCISION MORTON'S NEUROMA Left 09/21/2017   Procedure: EXCISION MORTON'S NEUROMA;  Surgeon: Albertine Patricia, DPM;  Location: [place];  Service: Podiatry;  Laterality: Left;  . FOOT SURGERY Right 1990  . HAND SURGERY Right   . LAPAROSCOPIC OOPHERECTOMY    . MINOR HARDWARE REMOVAL Left 11/22/2018   Procedure: REMOVAL SCREW;DEEP 2nd and 3RD LEFT CAPSULE RELEASE OF THIRD METATARSAL PHALANGEAL JOINT LEFT, TENDON LENGTHING THIRD TOE LEFT AND CORTISONE INJECTION LEFT;  Surgeon: Albertine Patricia, DPM;  Location: [place];  Service: Podiatry;  Laterality: Left;  LMA OR IVA LOCAL  . TONSILLECTOMY    . WEIL OSTEOTOMY Left 09/21/2017   Procedure: WEIL OSTEOTOMY-2ND & 3RD TOES;  Surgeon: Albertine Patricia, DPM;  Location: [place];  Service: Podiatry;  Laterality: Left;  LMA WITH LOCAL MINI MONSTER DR TROXLER WANTS TED HOSE ON PATIENT'S RIGHT LEG   Family History  Problem Relation Age of Onset  . Cancer Mother        breast  . Breast cancer Mother 6  . Cancer Father        pancreatic  . Asthma Father   . Heart disease Father   . Liver disease Sister   . Diabetes Sister   . Thyroid disease Sister   . Thyroid disease Brother   . Kidney disease Brother    Social History   Socioeconomic History  . Marital status: Married    Spouse name: Not on file  . Number of children: Not on file  . Years of education: Not on file  . Highest education level: Some college, no degree  Occupational History  . Occupation:  retired  Tobacco Use  . Smoking status: Never Smoker  . Smokeless tobacco: Never Used  Vaping Use  . Vaping Use: Never used  Substance and Sexual Activity  . Alcohol use: Yes    Alcohol/week: 1.0 standard drink    Types: 1 Glasses of wine per week    Comment: on occasion  . Drug use: No  . Sexual activity: Not Currently  Other Topics Concern  . 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 Difficulty of Paying Living Expenses:  Not hard at all  Food Insecurity: No Food Insecurity  . Worried About Charity fundraiser in the Last Year: Never true  . Ran Out of Food in the Last Year: Never true  Transportation Needs: No Transportation Needs  . Lack of Transportation (Medical): No  . Lack of Transportation (Non-Medical): No  Physical Activity: Inactive  . Days of Exercise per Week: 0 days  . Minutes of Exercise per Session: 0 min  Stress: No Stress Concern Present  . Feeling of Stress : Only a little  Social Connections: Socially Integrated  . Frequency of Communication with Friends and Family: More than three times a week  . Frequency of Social Gatherings with Friends and Family: More than three times a week  . Attends Religious Services: More than 4 times per year  . Active Member of Clubs or Organizations: Yes  . Attends Archivist Meetings: More than 4 times per year  . Marital Status: Married    Tobacco Counseling Counseling given: Not Answered   Clinical Intake:  Pre-visit preparation completed: Yes  Pain : 0-10 Pain Score: 2  Pain Type: Chronic pain Pain Location: Knee Pain Orientation: Right Pain Radiating Towards: down leg Pain Descriptors / Indicators: Aching Pain Onset: More than a month ago Pain Frequency: Intermittent Pain Relieving Factors:  cortisone shot helped  Pain Relieving Factors: cortisone shot helped  Nutritional Status: BMI 25 -29 Overweight Nutritional Risks: None Diabetes: No  How often do you need to have someone help you when you read instructions, pamphlets, or other written materials from your doctor or pharmacy?: 1 - Never What is the last grade level you completed in school?: 3.5 years college  Diabetic? no  Interpreter Needed?: No  Information entered by :: NAllen LPN   Activities of Daily Living In your present state of health, do you have any difficulty performing the following activities: 09/13/2019 11/22/2018  Hearing? Y N  Comment wear hearing aides -  Vision? N N  Difficulty concentrating or making decisions? N N  Walking or climbing stairs? N N  Dressing or bathing? N N  Doing errands, shopping? N -  Preparing Food and eating ? N -  Using the Toilet? N -  In the past six months, have you accidently leaked urine? N -  Do you have problems with loss of bowel control? N -  Managing your Medications? N -  Managing your Finances? N -  Housekeeping or managing your Housekeeping? N -  Some recent data might be hidden    Patient Care Team: Valerie Roys, DO as PCP - General (Family Medicine) End, Harrell Gave, MD as PCP - Cardiology (Cardiology) Pa, [place] (Optometry) Beverly Gust, MD (Unknown Physician Specialty) Delora Fuel, MD as Referring Physician (Audiology) End, Harrell Gave, MD as Consulting Physician (Cardiology) Cameron Sprang, MD as Consulting Physician (Neurology) Elvina Mattes, Adele Schilder as Attending Physician (Podiatry) Vanita Ingles, RN as Case Manager (General Practice)  Indicate any recent Medical Services you may have received from other than [facility] providers in the past year (date may be approximate).     Assessment:   This is a routine wellness examination for [place].  Hearing/Vision screen  Hearing Screening   125Hz  250Hz  500Hz  1000Hz  2000Hz   3000Hz  4000Hz  6000Hz  8000Hz   Right ear:           Left ear:           Vision Screening Comments: Regular eye exams, [facility]  Dietary  issues and exercise activities discussed: Current Exercise Habits: The patient does not participate in regular exercise at present  Goals    .  DIET - INCREASE WATER INTAKE      Recommend drinking at least 6-8 glasses of water a day     .  Patient Stated      09/13/2019, wants to lose 15 pounds    .  RNCM: Pt- "I try to live a healthy lifestyle" (pt-stated)      CARE PLAN ENTRY (see longtitudinal plan of care for additional care plan information)  Current Barriers:  . Chronic Disease Management support, education, and care coordination needs related to Depression and Asthma   Clinical Goal(s) related to Depression and Asthma :  Over the next 120 days, patient will:  . Work with the care management team to address educational, disease management, and care coordination needs  . Begin or continue self health monitoring activities as directed today  improve activity level as tolerated, and monitor for changes in allergies and breathing issues related to Asthma  . Call provider office for new or worsened signs and symptoms Oxygen saturation lower than established parameter, Shortness of breath, and New or worsened symptom related to depression or change in cardiac functions . Call care management team with questions or concerns . Verbalize basic understanding of patient centered plan of care established today  Interventions related to Depression and Asthma :  . Evaluation of current treatment plans and patient's adherence to plan as established by provider . Assessed patient understanding of disease states.  The patient has a good understanding of her health conditions and feels they are under good control at this time.  . Assessed patient's education and care coordination needs: The patient states that she needs refills for her inhalers. The patient ask  if Dr. [name] could refill her Ventolin (Albuterol, Proventil HFA; Ventolin HFA 108 mcg/ACT inhaler) inhaler and Qvar.  The patient is out of both and is no longer going to the pulmonary doctor at [facility]. Will collaborate with Dr. [name] concerning the patients request. Involve pharmacy support as needed. The patient states she has not heard back from the pharmacy. Wanted to know if this could be addressed again with pcp. Also ask for a refill for prescription cough medication.  The patient verbalized she has not had any in over a year but needs a refill because sometimes she starts coughing and this is the only thing that helps her stop. Will send in basket message to pcp and pharmacist for assistance.  . Provided disease specific education to patient: the patient is following a heart healthy diet and loves to cook. . Evaluation of recent Echocardiogram. The patients EF is 60-65%.  The patient verbalized they told her there is nothing wrong with her heart.  Nash Dimmer with appropriate clinical care team members regarding patient needs- knows about LCSW and Pharmacist.   Patient Self Care Activities related to Depression and Asthma :  . Patient is unable to independently self-manage chronic health conditions  Please see past updates related to this goal by clicking on the "Past Updates" button in the selected goal      .  RNCM: Pt-"My right leg is still hurting" (pt-stated)      [place] (see longitudinal plan of care for additional care plan information)  Current Barriers:  Marland Kitchen Knowledge Deficits related to etiology of right leg pain and staying tired "all" the time. The patient feels there is something wrong with  her thyroid even though she has had testing and it shows no issues. She has a significant family history of thyroid issues.  . Care Coordination needs related to right leg pain and chronic fatigue in a patient with Osteopenia and other chronic conditions (disease states)  Nurse  Case Manager Clinical Goal(s):  Marland Kitchen Over the next 120 days, patient will verbalize understanding of plan for support and education related to pain in right leg and chronic fatigue  . Over the next 120 days, patient will work with pcp and CCM team  to address needs related to right leg pain and chronic fatigue  . Over the next 120 days, patient will attend all scheduled medical appointments: no upcoming appointments with the pcp but ask the patient when she returned from her vacation to call the office and make an appointment.   Interventions:  . Inter-disciplinary care team collaboration (see longitudinal plan of care) . Advised patient to discuss her concerns about the continued leg pain and discomfort and chronic fatigue with the pcp.  The patient believes there is a correlation with her thyroid to these problems. Has a significant family history of thyroid problems.  She is frustrated and doesn't understand why she feels the way she does. Sometimes her leg feels heavy like she can't even move it. She verbalized that she has not found anything to relieve the pain.  Denies use of DME or falls.  . Provided education to patient re: talking to pcp about recommendations. The patient states her niece was having the same symptoms as she does and got involved with a holistic provider and is now doing great. Education and support given. The patient states her mother, her siblings, her daughters, her nieces and other family members all have or had thyroid issues. She has been tested and they can not find anything wrong with her thyroid but she feels if she could go on a thyroid supplement it would make a huge difference in the way she feels. She states that she has to drink a lot of coffee and energy drinks to keep going.  Marland Kitchen Collaborated with pcp and CCM pharmacist  regarding right leg pain and chronic fatigue.  Marland Kitchen Discussed plans with patient for ongoing care management follow up and provided patient with direct  contact information for care management team . Reviewed scheduled/upcoming provider appointments including: the patient to make an appointment to come in to be seen by the pcp upon arrival back home from vacation.   Patient Self Care Activities:  . Patient verbalizes understanding of plan to discuss her leg pain and chronic fatigue with the pcp for treatment and recommendations . Attends all scheduled provider appointments . Calls provider office for new concerns or questions . Unable to independently manage right leg pain and chronic fatigue  Initial goal documentation       Depression Screen PHQ 2/9 Scores 09/13/2019 05/31/2019 01/21/2019 04/25/2018 11/30/2017 09/11/2017 11/30/2016  PHQ - 2 Score 1 0 0 5 0 0 0  PHQ- 9 Score 2 - 0 15 - 0 -  Exception Documentation - - - - - - -    Fall Risk Fall Risk  09/13/2019 11/30/2017 10/16/2017 08/25/2017 11/30/2016  Falls in the past year? 1 No No No Yes  Comment due to weakness of right leg - - - -  Number falls in past yr: 1 - - - 2 or more  Injury with Fall? 0 - - - No  Comment - - - - -  Risk Factor Category  - - - - High Fall Risk  Comment - - - - states she sustained recent hearing loss and has recently gotten hearing aids  Risk for fall due to : History of fall(s);Medication side effect - - - Impaired balance/gait;Impaired vision  Risk for fall due to: Comment - - - - recently developed hearing loss and now has hearing aids. Also wears glasses  Follow up Falls evaluation completed;Education provided;Falls prevention discussed - - - Education provided;Falls prevention discussed    Any stairs in or around the home? Yes  If so, are there any without handrails? Yes  Home free of loose throw rugs in walkways, pet beds, electrical cords, etc? Yes  Adequate lighting in your home to reduce risk of falls? Yes   ASSISTIVE DEVICES UTILIZED TO PREVENT FALLS:  Life alert? No  Use of a cane, walker or w/c? No  Grab bars in the bathroom? No  Shower chair  or bench in shower? No  Elevated toilet seat or a handicapped toilet? No   TIMED UP AND GO:  Was the test performed? No .     Cognitive Function:     6CIT Screen 09/13/2019 11/30/2017 11/30/2016  What Year? 0 points 0 points 0 points  What month? 0 points 0 points 0 points  What time? 0 points 0 points 0 points  Count back from 20 0 points 0 points 0 points  Months in reverse 0 points 0 points 2 points  Repeat phrase 0 points 0 points 0 points  Total Score 0 0 2    Immunizations Immunization History  Administered Date(s) Administered  . Hepatitis B 03/17/2008, 05/05/2008, 08/05/2008  . Influenza, High Dose Seasonal PF 11/30/2016, 11/30/2017  . Influenza,inj,quad, With Preservative 12/07/2018  . Influenza-Unspecified 11/06/2015  . PFIZER SARS-COV-2 Vaccination 04/17/2019, 05/08/2019  . Pneumococcal Conjugate-13 11/30/2016  . Pneumococcal Polysaccharide-23 11/30/2017  . Td 03/07/1997, 12/01/2008  . Zoster 03/20/2015  . Zoster Recombinat (Shingrix) 03/06/2019, 08/15/2019    TDAP status: Up to date Flu Vaccine status: Up to date Pneumococcal vaccine status: Up to date Covid-19 vaccine status: Completed vaccines  Qualifies for Shingles Vaccine? Yes   Zostavax completed Yes   Shingrix Completed?: Yes  Screening Tests Health Maintenance  Topic Date Due  . TETANUS/TDAP  12/02/2018  . INFLUENZA VACCINE  10/06/2019  . MAMMOGRAM  12/27/2019  . COLONOSCOPY  01/18/2021  . DEXA SCAN  Completed  . COVID-19 Vaccine  Completed  . Hepatitis C Screening  Completed  . PNA vac Low Risk Adult  Completed    Health Maintenance  Health Maintenance Due  Topic Date Due  . TETANUS/TDAP  12/02/2018    Colorectal cancer screening: Completed 01/25/2011. Repeat every 10 years Mammogram status: Completed 12/26/2017. Repeat every year Bone Density status: Completed 06/05/2008.   Lung Cancer Screening: (Low Dose CT Chest recommended if Age 20-80 years, 30 pack-year currently smoking OR  have quit w/in 15years.) does not qualify.   Lung Cancer Screening Referral: no  Additional Screening:  Hepatitis C Screening: does qualify; Completed 03/17/2009  Vision Screening: Recommended annual ophthalmology exams for early detection of glaucoma and other disorders of the eye. Is the patient up to date with their annual eye exam?  Yes  Who is the provider or what is the name of the office in which the patient attends annual eye exams? [facility] If pt is not established with a provider, would they like to be referred to a provider to establish care?  No .   Dental Screening: Recommended annual dental exams for proper oral hygiene  Community Resource Referral / Chronic Care Management: CRR required this visit?  No   CCM required this visit?  No      Plan:     I have personally reviewed and noted the following in the patient's chart:   . Medical and social history . Use of alcohol, tobacco or illicit drugs  . Current medications and supplements . Functional ability and status . Nutritional status . Physical activity . Advanced directives . List of other physicians . Hospitalizations, surgeries, and ER visits in previous 12 months . Vitals . Screenings to include cognitive, depression, and falls . Referrals and appointments  In addition, I have reviewed and discussed with patient certain preventive protocols, quality metrics, and best practice recommendations. A written personalized care plan for preventive services as well as general preventive health recommendations were provided to patient.     Kellie Simmering, LPN   2/0/8022   Nurse Notes: Patient has a metallic taste in mouth. Encouraged her to call office to make an appointment if continues.

## 2019-09-16 ENCOUNTER — Other Ambulatory Visit: Payer: Self-pay | Admitting: Nurse Practitioner

## 2019-09-16 NOTE — Telephone Encounter (Signed)
Requested medication (s) are due for refill today: no  Requested medication (s) are on the active medication list: yes  Last refill:  08/23/2019  Future visit scheduled: yes  Notes to clinic: One inhaler should last at least one month. If the patient is requesting refills earlier, contact the patient to check for uncontrolled symptoms   Requested Prescriptions  Pending Prescriptions Disp Refills   albuterol (VENTOLIN HFA) 108 (90 Base) MCG/ACT inhaler [Pharmacy Med Name: ALBUTEROL HFA 90 MCG INHALER] 8.5 g 0    Sig: Inhale 2 puffs into the lungs every 6 (six) hours as needed for wheezing or shortness of breath.      Pulmonology:  Beta Agonists Failed - 09/16/2019 11:22 AM      Failed - One inhaler should last at least one month. If the patient is requesting refills earlier, contact the patient to check for uncontrolled symptoms.      Passed - Valid encounter within last 12 months    Recent Outpatient Visits           7 months ago Recurrent major depressive disorder, in partial remission ([place])   [place], Megan P, DO   1 year ago Recurrent major depressive disorder, in partial remission ([place])   [place], [place]fax T, NP   1 year ago Moderate persistent asthma with exacerbation   [place], [place] T, NP   2 years ago Near syncope   [place] P, DO   2 years ago Moderate asthma with exacerbation, unspecified whether persistent   [place], PA-C       Future Appointments             In 3 months End, Harrell Gave, MD [facility], [place]

## 2019-09-18 ENCOUNTER — Ambulatory Visit: Payer: Self-pay | Admitting: General Practice

## 2019-09-18 ENCOUNTER — Telehealth: Payer: Self-pay

## 2019-09-18 DIAGNOSIS — J301 Allergic rhinitis due to pollen: Secondary | ICD-10-CM | POA: Diagnosis not present

## 2019-09-18 NOTE — Chronic Care Management (AMB) (Signed)
  Chronic Care Management   Outreach Note  09/18/2019 Name: Andrea Savage MRN: [id number] DOB: 04/10/1945  Referred by: Valerie Roys, DO Reason for referral : Chronic Care Management (RNCM follow up call: Chronic Disease Management and Care Coordination Needs )   A second unsuccessful telephone outreach was attempted today. The patient was referred to the case management team for assistance with care management and care coordination.   Follow Up Plan: A HIPPA compliant phone message was left for the patient providing contact information and requesting a return call.   Noreene Larsson RN, MSN, [facility] Mobile: [phone]

## 2019-09-19 ENCOUNTER — Other Ambulatory Visit: Payer: Self-pay | Admitting: Family Medicine

## 2019-09-25 DIAGNOSIS — J301 Allergic rhinitis due to pollen: Secondary | ICD-10-CM | POA: Diagnosis not present

## 2019-09-30 ENCOUNTER — Other Ambulatory Visit
Admission: RE | Admit: 2019-09-30 | Discharge: 2019-09-30 | Disposition: A | Discharge status: Home / Self Care | Payer: PPO | Attending: Family | Admitting: Family

## 2019-09-30 DIAGNOSIS — Z79899 Other long term (current) drug therapy: Secondary | ICD-10-CM | POA: Diagnosis not present

## 2019-09-30 DIAGNOSIS — E785 Hyperlipidemia, unspecified: Secondary | ICD-10-CM

## 2019-09-30 LAB — HEPATIC FUNCTION PANEL
ALT: 17 U/L (ref 0–44)
AST: 22 U/L (ref 15–41)
Albumin: 4 g/dL (ref 3.5–5.0)
Alkaline Phosphatase: 61 U/L (ref 38–126)
Bilirubin, Direct: <0.1 mg/dL (ref 0.0–0.2)
Total Bilirubin: 1 mg/dL (ref 0.3–1.2)
Total Protein: 6.9 g/dL (ref 6.5–8.1)

## 2019-09-30 LAB — LIPID PANEL
Cholesterol: 219 mg/dL — ABNORMAL HIGH (ref 0–200)
HDL: 76 mg/dL (ref >40)
LDL Cholesterol: 128 mg/dL — ABNORMAL HIGH (ref 0–99)
Total CHOL/HDL Ratio: 2.9 RATIO
Triglycerides: 75 mg/dL (ref <150)
VLDL: 15 mg/dL (ref 0–40)

## 2019-10-01 ENCOUNTER — Other Ambulatory Visit: Payer: Self-pay | Admitting: Internal Medicine

## 2019-10-02 ENCOUNTER — Telehealth: Payer: Self-pay | Admitting: *Deleted

## 2019-10-02 ENCOUNTER — Telehealth: Payer: Self-pay

## 2019-10-02 DIAGNOSIS — E785 Hyperlipidemia, unspecified: Secondary | ICD-10-CM

## 2019-10-02 DIAGNOSIS — J301 Allergic rhinitis due to pollen: Secondary | ICD-10-CM | POA: Diagnosis not present

## 2019-10-02 DIAGNOSIS — Z79899 Other long term (current) drug therapy: Secondary | ICD-10-CM

## 2019-10-02 MED ORDER — NEXLIZET 180-10 MG PO TABS: 1.0000 | ORAL_TABLET | Freq: Every day | ORAL | 5 refills | Status: DC

## 2019-10-02 NOTE — Telephone Encounter (Signed)
Results called to pt. Pt verbalized understanding of results and plan of care. She is agreeable to replace zetia with Nexlizet 180-10 mg by mouth daily. Rx sent to pharmacy. She verbalized understanding that she will need repeat fasting lab work in 8 weeks (around the end of September) at the [facility].

## 2019-10-02 NOTE — Telephone Encounter (Signed)
-----   Message from Loel Dubonnet, NP sent at 10/02/2019  8:06 AM EDT ----- Normal liver function. LDL (bad cholesterol) remains elevated despite Zetia. Please ensure she is taking Zetia regularly. If she is, recommend transition to Nexlizet 180mg  tablet daily and repeat lipid/liver in 8 weeks.

## 2019-10-02 NOTE — Telephone Encounter (Signed)
PA started through [employer].   Gilda Crease  KeyCher Nakai  PA Case ID: [id number]  Your information has been submitted to [place]. To check for an updated outcome later, reopen this PA request from your dashboard.  If [employer] has not responded to your request within 24 hours, contact [facility] at [phone]. If you think there may be a problem with your PA request, use our live chat feature at the bottom right.  Awaiting determination.

## 2019-10-03 NOTE — Telephone Encounter (Signed)
Thank you! I appreciate you.  Loel Dubonnet, NP

## 2019-10-03 NOTE — Telephone Encounter (Signed)
PA was approved for Nexlizet  Approval dates: 10/02/2019 - 10/02/2022

## 2019-10-07 DIAGNOSIS — M1711 Unilateral primary osteoarthritis, right knee: Secondary | ICD-10-CM | POA: Diagnosis not present

## 2019-10-07 DIAGNOSIS — M65341 Trigger finger, right ring finger: Secondary | ICD-10-CM | POA: Diagnosis not present

## 2019-10-09 DIAGNOSIS — J301 Allergic rhinitis due to pollen: Secondary | ICD-10-CM | POA: Diagnosis not present

## 2019-10-11 DIAGNOSIS — J301 Allergic rhinitis due to pollen: Secondary | ICD-10-CM | POA: Diagnosis not present

## 2019-10-15 ENCOUNTER — Ambulatory Visit (INDEPENDENT_AMBULATORY_CARE_PROVIDER_SITE_OTHER): Payer: PPO | Admitting: Nurse Practitioner

## 2019-10-15 ENCOUNTER — Other Ambulatory Visit: Payer: Self-pay

## 2019-10-15 ENCOUNTER — Encounter: Payer: Self-pay | Admitting: Nurse Practitioner

## 2019-10-15 VITALS — HR 69 | Temp 97.1°F

## 2019-10-15 DIAGNOSIS — R05 Cough: Secondary | ICD-10-CM

## 2019-10-15 DIAGNOSIS — J40 Bronchitis, not specified as acute or chronic: Secondary | ICD-10-CM | POA: Insufficient documentation

## 2019-10-15 DIAGNOSIS — R059 Cough, unspecified: Secondary | ICD-10-CM

## 2019-10-15 DIAGNOSIS — R053 Chronic cough: Secondary | ICD-10-CM | POA: Insufficient documentation

## 2019-10-15 MED ORDER — HYDROCOD POLST-CPM POLST ER 10-8 MG/5ML PO SUER: 5.0000 mL | Freq: Every evening | ORAL | 0 refills | Status: DC | PRN

## 2019-10-15 MED ORDER — PREDNISONE 10 MG PO TABS: ORAL_TABLET | ORAL | 0 refills | Status: AC

## 2019-10-15 MED ORDER — HYDROCOD POLST-CPM POLST ER 10-8 MG/5ML PO SUER: 5.0000 mL | Freq: Two times a day (BID) | ORAL | 0 refills | Status: DC | PRN

## 2019-10-15 NOTE — Progress Notes (Signed)
Pulse 69    Temp (!) 97.1 F (36.2 C)    Subjective:    Patient ID: Andrea Savage, female    DOB: 02/17/1946, 74 y.o.   MRN: [id number]  HPI: Andrea Savage is a 74 y.o. female presenting for upper respiratory tract infection.  Chief Complaint  Patient presents with   Cough    Ongoing 3 days.    Cough Congestion   Back Pain   Abdominal Pain   UPPER RESPIRATORY TRACT INFECTION Has a history of asthma; daughter fosters kittens from the pound and she is allergic to them.  Has a funeral in [place] on Saturday. Worst symptom: cough Fever: no Cough: yes Shortness of breath: yes Wheezing: yes with coughing Chest pain: no  Body aches: no Abdominal pain: yes from coughing Back pain: yes from coughing Chest tightness: no Chest congestion: yes Nasal congestion: yes Runny nose: yes Post nasal drip: no Sneezing: no Sore throat: yes Swollen glands: no Sinus pressure: no Headache: no Face pain: no Toothache: no Ear pain: no  Ear pressure: no  Eyes red/itching:no Eye drainage/crusting: no  Nausea: no Vomiting: no Rash: no Fatigue: no Sick contacts: no Strep contacts: no  Context: fluctuating Recurrent sinusitis: no Relief with OTC cold/cough medications:   Treatments attempted: Albuterol, Tussionex   Allergies  Allergen Reactions   Compazine [Prochlorperazine] Other (See Comments)    Grand mal seizure   Dust Mite Extract    Outpatient Encounter Medications as of 10/15/2019  Medication Sig Note   albuterol (VENTOLIN HFA) 108 (90 Base) MCG/ACT inhaler Inhale 2 puffs into the lungs every 6 (six) hours as needed for wheezing or shortness of breath.    Ascorbic Acid (VITAMIN C) 100 MG tablet Take 100 mg by mouth daily.    aspirin EC 81 MG tablet Take 1 tablet (81 mg total) by mouth daily.    Bempedoic Acid-Ezetimibe (NEXLIZET) 180-10 MG TABS Take 1 tablet by mouth daily.    busPIRone (BUSPAR) 5 MG tablet Take 1 tablet (5 mg total) by mouth  daily.    CALCIUM-MAGNESIUM-ZINC PO Take by mouth daily.    cetirizine (ZYRTEC) 10 MG tablet Take 10 mg by mouth daily.    citalopram (CELEXA) 40 MG tablet Take 1 tablet (20 mg total) by mouth daily.    COLLAGEN PO Take by mouth. Takes 2-3 times per week in shakes    metoprolol succinate (TOPROL-XL) 25 MG 24 hr tablet Take 0.5 tablets (12.5 mg total) by mouth daily.    Multiple Vitamins-Minerals (MULTIVITAMIN/EXTRA VITAMIN D3 PO) Take by mouth.     naproxen sodium (ALEVE) 220 MG tablet Take 440 mg by mouth at bedtime.     omeprazole (PRILOSEC) 40 MG capsule Take 40 mg by mouth daily.     Specialty Vitamins Products (MAGNESIUM, AMINO ACID CHELATE,) 133 MG tablet Take 2-3 tablets by mouth as directed. Takes most evenings for leg pain    beclomethasone (QVAR) 40 MCG/ACT inhaler Inhale 2 puffs into the lungs 2 (two) times daily. 05/31/2019: Needs a refil   chlorpheniramine-HYDROcodone (TUSSIONEX PENNKINETIC ER) 10-8 MG/5ML SUER Take 5 mLs by mouth at bedtime as needed for cough.    CINNAMON PO Take by mouth. Uses in shakes 2-3 times per week    furosemide (LASIX) 20 MG tablet Take 1 tablet (20 mg total) by mouth daily.    Ginger, Zingiber officinalis, (GINGER PO) Take by mouth.    predniSONE (DELTASONE) 10 MG tablet Take 6 tablets (60 mg total) by  mouth daily with breakfast for 1 day, THEN 5 tablets (50 mg total) daily with breakfast for 1 day, THEN 4 tablets (40 mg total) daily with breakfast for 1 day, THEN 3 tablets (30 mg total) daily with breakfast for 1 day, THEN 2 tablets (20 mg total) daily with breakfast for 1 day, THEN 1 tablet (10 mg total) daily with breakfast for 1 day.    TURMERIC PO Take by mouth. Does not take daily    [DISCONTINUED] chlorpheniramine-HYDROcodone (TUSSIONEX PENNKINETIC ER) 10-8 MG/5ML SUER Take 5 mLs by mouth every 12 (twelve) hours as needed for cough.    No facility-administered encounter medications on file as of 10/15/2019.   Patient Active Problem  List   Diagnosis Date Noted   Cough 10/15/2019   Bronchitis 10/15/2019   Hyperlipidemia 07/22/2019   Paroxysmal SVT (supraventricular tachycardia) ([place]) 06/13/2018   Near syncope 09/11/2017   Asthma 03/23/2017   Fibromyalgia 03/10/2016   Sensorineural hearing loss (SNHL) of left ear with restricted hearing of right ear 12/29/2015   Post herpetic neuralgia 11/12/2015   Depression 06/29/2015   Osteopenia    Reflux    Rosacea    Past Medical History:  Diagnosis Date   Asthma    Depression    Fibromyalgia    GERD (gastroesophageal reflux disease)    H/O scarlet fever    as child   Heart murmur    s/p scarlet fever as child   IBS (irritable bowel syndrome)    Low bone density    Osteopenia    Reflux    Rosacea    Wears hearing aid in both ears     Relevant past medical, surgical, family and social history reviewed and updated as indicated. Interim medical history since our last visit reviewed.  Review of Systems  Constitutional: Negative.  Negative for activity change, appetite change, chills, fatigue and fever.  HENT: Positive for congestion and sore throat. Negative for dental problem, ear discharge, ear pain, hearing loss, postnasal drip, rhinorrhea, sinus pressure, sinus pain, sneezing and trouble swallowing.   Eyes: Negative.  Negative for pain, discharge, redness and itching.  Respiratory: Positive for cough and wheezing. Negative for chest tightness and shortness of breath.   Cardiovascular: Negative.  Negative for chest pain.  Gastrointestinal: Negative.  Negative for nausea and vomiting.  Skin: Negative.  Negative for pallor and rash.  Neurological: Negative.  Negative for headaches.  Psychiatric/Behavioral: Negative.  Negative for confusion and decreased concentration. The patient is not nervous/anxious.     Per HPI unless specifically indicated above     Objective:    Pulse 69    Temp (!) 97.1 F (36.2 C)   Wt Readings from Last 3  Encounters:  09/13/19 156 lb (70.8 kg)  08/14/19 159 lb 4 oz (72.2 kg)  07/08/19 156 lb 6 oz (70.9 kg)    Physical Exam Nursing note reviewed.  Constitutional:      General: She is not in acute distress.    Appearance: Normal appearance. She is not toxic-appearing.  HENT:     Head: Normocephalic and atraumatic.     Right Ear: External ear normal.     Left Ear: External ear normal.     Nose: Nose normal. No congestion or rhinorrhea.     Mouth/Throat:     Mouth: Mucous membranes are moist.     Pharynx: Oropharynx is clear.  Eyes:     General: No scleral icterus.       Right eye: No  discharge.        Left eye: No discharge.     Extraocular Movements: Extraocular movements intact.  Cardiovascular:     Rate and Rhythm: Normal rate.     Comments: Unable to assess heart sounds via virtual visit Pulmonary:     Effort: Pulmonary effort is normal. No respiratory distress.     Comments: Unable to assess lung sounds via virtual visit - patient talking in complete sentences Abdominal:     Comments: Unable to assess via virtual visit  Skin:    Coloration: Skin is not jaundiced or pale.     Findings: No erythema.  Neurological:     General: No focal deficit present.     Mental Status: She is alert and oriented to person, place, and time.     Motor: No weakness.  Psychiatric:        Mood and Affect: Mood normal.        Behavior: Behavior normal.        Thought Content: Thought content normal.        Judgment: Judgment normal.       Assessment & Plan:   Problem List Items Addressed This Visit      Respiratory   Bronchitis    Acute, ongoing.  Likely due to allergen/inflammatory cause.    Treat symptomatically with cough suppressant - PDMP reviewed and Tussionex refilled, increase hydration, try alternate modalities for cough like warm water with lemon, honey.  Can continue albuterol inhaler as needed.  Will also start steroid taper to help with probable lung inflammation.  Advised  COVID-19 testing to rule out and wear mask at all times when in public.        Other   Cough - Primary    Acute, ongoing.  Likely due to allergen/inflammatory cause.    Treat symptomatically with cough suppressant - PDMP reviewed and Tussionex refilled, increase hydration, try alternate modalities for cough like warm water with lemon, honey.  Can continue albuterol inhaler as needed.  Will also start steroid taper to help with probable lung inflammation.  Advised COVID-19 testing to rule out and wear mask at all times when in public.          Follow up plan: Return if symptoms worsen or fail to improve.   Due to the catastrophic nature of the COVID-19 pandemic, this visit was completed via audio and visual contact via Mychart due to the restrictions of the COVID-19 pandemic. All issues as above were discussed and addressed. Physical exam was done as above through visual confirmation on Mychart. If it was felt that the patient should be evaluated in the office, they were directed there. The patient verbally consented to this visit."}  Location of the patient: home  Location of the provider: work  Those involved with this call:   Provider: Carnella Guadalajara, DNP  CMA: Merilyn Baba, [place] Desk/Registration: Don Perking   Time spent on call: 17 minutes with patient face to face via video conference. More than 50% of this time was spent in counseling and coordination of care. 20 minutes total spent in review of patient's record and preparation of their chart.  I verified patient identity using two factors (patient name and date of birth). Patient consents verbally to being seen via telemedicine visit today.

## 2019-10-15 NOTE — Patient Instructions (Signed)

## 2019-10-15 NOTE — Assessment & Plan Note (Addendum)
Acute, ongoing.  Likely due to allergen/inflammatory cause.    Treat symptomatically with cough suppressant - PDMP reviewed and Tussionex refilled, increase hydration, try alternate modalities for cough like warm water with lemon, honey.  Can continue albuterol inhaler as needed.  Will also start steroid taper to help with probable lung inflammation.  Advised COVID-19 testing to rule out and wear mask at all times when in public.

## 2019-10-15 NOTE — Assessment & Plan Note (Signed)
Acute, ongoing.  Likely due to allergen/inflammatory cause.    Treat symptomatically with cough suppressant - PDMP reviewed and Tussionex refilled, increase hydration, try alternate modalities for cough like warm water with lemon, honey.  Can continue albuterol inhaler as needed.  Will also start steroid taper to help with probable lung inflammation.  Advised COVID-19 testing to rule out and wear mask at all times when in public.

## 2019-10-23 DIAGNOSIS — J301 Allergic rhinitis due to pollen: Secondary | ICD-10-CM | POA: Diagnosis not present

## 2019-10-23 DIAGNOSIS — H903 Sensorineural hearing loss, bilateral: Secondary | ICD-10-CM | POA: Diagnosis not present

## 2019-10-24 DIAGNOSIS — J309 Allergic rhinitis, unspecified: Secondary | ICD-10-CM | POA: Diagnosis not present

## 2019-10-24 DIAGNOSIS — J45991 Cough variant asthma: Secondary | ICD-10-CM | POA: Diagnosis not present

## 2019-10-24 DIAGNOSIS — H903 Sensorineural hearing loss, bilateral: Secondary | ICD-10-CM | POA: Diagnosis not present

## 2019-10-28 ENCOUNTER — Other Ambulatory Visit: Payer: Self-pay | Admitting: Family Medicine

## 2019-10-30 ENCOUNTER — Telehealth: Payer: Self-pay | Admitting: General Practice

## 2019-10-30 ENCOUNTER — Ambulatory Visit (INDEPENDENT_AMBULATORY_CARE_PROVIDER_SITE_OTHER): Payer: PPO | Admitting: General Practice

## 2019-10-30 DIAGNOSIS — J301 Allergic rhinitis due to pollen: Secondary | ICD-10-CM | POA: Diagnosis not present

## 2019-10-30 DIAGNOSIS — E782 Mixed hyperlipidemia: Secondary | ICD-10-CM | POA: Diagnosis not present

## 2019-10-30 DIAGNOSIS — F3341 Major depressive disorder, recurrent, in partial remission: Secondary | ICD-10-CM | POA: Diagnosis not present

## 2019-10-30 DIAGNOSIS — M8580 Other specified disorders of bone density and structure, unspecified site: Secondary | ICD-10-CM

## 2019-10-30 DIAGNOSIS — R059 Cough, unspecified: Secondary | ICD-10-CM

## 2019-10-30 DIAGNOSIS — J4540 Moderate persistent asthma, uncomplicated: Secondary | ICD-10-CM

## 2019-10-30 DIAGNOSIS — J40 Bronchitis, not specified as acute or chronic: Secondary | ICD-10-CM

## 2019-10-30 NOTE — Patient Instructions (Signed)
Visit Information  Goals Addressed              This Visit's Progress     RNCM: Pt- "I try to live a healthy lifestyle" (pt-stated)        CARE PLAN ENTRY (see longtitudinal plan of care for additional care plan information)  Current Barriers:   Chronic Disease Management support, education, and care coordination needs related to HLD, Depression, and Asthma   Clinical Goal(s) related to HLD, Depression, and Asthma :  Over the next 120 days, patient will:   Work with the care management team to address educational, disease management, and care coordination needs   Begin or continue self health monitoring activities as directed today  improve activity level as tolerated, and monitor for changes in allergies and breathing issues related to Asthma   Call provider office for new or worsened signs and symptoms Oxygen saturation lower than established parameter, Shortness of breath, and New or worsened symptom related to depression or change in cardiac functions  Call care management team with questions or concerns  Verbalize basic understanding of patient centered plan of care established today  Interventions related to HLD, Depression, and Asthma :   Evaluation of current treatment plans and patient's adherence to plan as established by provider.  10-30-2019: The patient states that she saw the pcp on 10-15-2019 and she is doing better but the cough is still there. She saw specialist last week and he told her she had "lung disease" and recommended that she get the booster for COVID 19.  The patient verbalized she will do this. She is currently at the beach with her niece.   Assessed patient understanding of disease states.  The patient has a good understanding of her health conditions and feels they are under good control at this time.   Assessed patient's education and care coordination needs: The patient states that she needs refills for her inhalers. The patient ask if Dr. [name]  could refill her Ventolin (Albuterol, Proventil HFA; Ventolin HFA 108 mcg/ACT inhaler) inhaler and Qvar.  The patient is out of both and is no longer going to the pulmonary doctor at [facility]. Will collaborate with Dr. [name] concerning the patients request. Involve pharmacy support as needed. The patient states she has not heard back from the pharmacy. Wanted to know if this could be addressed again with pcp. Also ask for a refill for prescription cough medication.  The patient verbalized she has not had any in over a year but needs a refill because sometimes she starts coughing and this is the only thing that helps her stop. Will send in basket message to pcp and pharmacist for assistance. 10-30-2019: The patient has adequate refills and denies any needs for refills at this time. The patient verbalized that her father had a lot of issues with his breathing and she has resolved that she will likely have to deal with the breathing issues she is having. She says she just gets frustrated because she is always clearing her throat and some days she has lesser symptoms and sometimes she has worse. She is trying to work with the providers to see what will work best for her in managing her symptoms best.   Provided disease specific education to patient: the patient is following a heart healthy diet and loves to cook.  Evaluation of recent Echocardiogram. The patients EF is 60-65%.  The patient verbalized they told her there is nothing wrong with her heart.   Collaborated  with appropriate clinical care team members regarding patient needs- knows about LCSW and Pharmacist.   Patient Self Care Activities related to HLD, Depression, and Asthma :   Patient is unable to independently self-manage chronic health conditions  Please see past updates related to this goal by clicking on the "Past Updates" button in the selected goal        COMPLETED: RNCM: Pt-"My right leg is still hurting" (pt-stated)        [place] (see longitudinal plan of care for additional care plan information)  Current Barriers: Goal completed. The patient had a cortisone injection and is doing much better  Knowledge Deficits related to etiology of right leg pain and staying tired "all" the time. The patient feels there is something wrong with her thyroid even though she has had testing and it shows no issues. She has a significant family history of thyroid issues.   Care Coordination needs related to right leg pain and chronic fatigue in a patient with Osteopenia and other chronic conditions (disease states)  Nurse Case Manager Clinical Goal(s):   Over the next 120 days, patient will verbalize understanding of plan for support and education related to pain in right leg and chronic fatigue   Over the next 120 days, patient will work with pcp and CCM team  to address needs related to right leg pain and chronic fatigue   Over the next 120 days, patient will attend all scheduled medical appointments: no upcoming appointments with the pcp but ask the patient when she returned from her vacation to call the office and make an appointment.   Interventions:   Inter-disciplinary care team collaboration (see longitudinal plan of care)  Advised patient to discuss her concerns about the continued leg pain and discomfort and chronic fatigue with the pcp.  The patient believes there is a correlation with her thyroid to these problems. Has a significant family history of thyroid problems.  She is frustrated and doesn't understand why she feels the way she does. Sometimes her leg feels heavy like she can't even move it. She verbalized that she has not found anything to relieve the pain.  Denies use of DME or falls.   Provided education to patient re: talking to pcp about recommendations. The patient states her niece was having the same symptoms as she does and got involved with a holistic provider and is now doing great. Education and  support given. The patient states her mother, her siblings, her daughters, her nieces and other family members all have or had thyroid issues. She has been tested and they can not find anything wrong with her thyroid but she feels if she could go on a thyroid supplement it would make a huge difference in the way she feels. She states that she has to drink a lot of coffee and energy drinks to keep going.   Collaborated with pcp and CCM pharmacist  regarding right leg pain and chronic fatigue.   Discussed plans with patient for ongoing care management follow up and provided patient with direct contact information for care management team  Reviewed scheduled/upcoming provider appointments including: the patient to make an appointment to come in to be seen by the pcp upon arrival back home from vacation.   Patient Self Care Activities:   Patient verbalizes understanding of plan to discuss her leg pain and chronic fatigue with the pcp for treatment and recommendations  Attends all scheduled provider appointments  Calls provider office for new  concerns or questions  Unable to independently manage right leg pain and chronic fatigue  Please see past updates related to this goal by clicking on the "Past Updates" button in the selected goal         Patient verbalizes understanding of instructions provided today.   Telephone follow up appointment with care management team member scheduled for: 12-25-2019 at 3:15 pm  Noreene Larsson RN, MSN, [facility] Mobile: [phone]

## 2019-10-30 NOTE — Chronic Care Management (AMB) (Signed)
Chronic Care Management   Follow Up Note   10/30/2019 Name: Andrea Savage MRN: [id number] DOB: 1945-08-04  Referred by: Valerie Roys, DO Reason for referral : Chronic Care Management (RNCM: Chronic Disease Management and Care Coordination Needs)   Andrea Savage is a 74 y.o. year old female who is a primary care patient of Valerie Roys, DO. The CCM team was consulted for assistance with chronic disease management and care coordination needs.    Review of patient status, including review of consultants reports, relevant laboratory and other test results, and collaboration with appropriate care team members and the patient's provider was performed as part of comprehensive patient evaluation and provision of chronic care management services.    SDOH (Social Determinants of Health) assessments performed: Yes See Care Plan activities for detailed interventions related to [facility])     Outpatient Encounter Medications as of 10/30/2019  Medication Sig Note  . albuterol (VENTOLIN HFA) 108 (90 Base) MCG/ACT inhaler Inhale 2 puffs into the lungs every 6 (six) hours as needed for wheezing or shortness of breath.   . Ascorbic Acid (VITAMIN C) 100 MG tablet Take 100 mg by mouth daily.   Marland Kitchen aspirin EC 81 MG tablet Take 1 tablet (81 mg total) by mouth daily.   . beclomethasone (QVAR) 40 MCG/ACT inhaler Inhale 2 puffs into the lungs 2 (two) times daily. 05/31/2019: Needs a refil  . Bempedoic Acid-Ezetimibe (NEXLIZET) 180-10 MG TABS Take 1 tablet by mouth daily.   . busPIRone (BUSPAR) 5 MG tablet Take 1 tablet (5 mg total) by mouth daily.   Marland Kitchen CALCIUM-MAGNESIUM-ZINC PO Take by mouth daily.   . cetirizine (ZYRTEC) 10 MG tablet Take 10 mg by mouth daily.   . chlorpheniramine-HYDROcodone (TUSSIONEX PENNKINETIC ER) 10-8 MG/5ML SUER Take 5 mLs by mouth at bedtime as needed for cough.   Marland Kitchen CINNAMON PO Take by mouth. Uses in shakes 2-3 times per week   . citalopram (CELEXA) 40 MG tablet Take 1 tablet  (20 mg total) by mouth daily.   . COLLAGEN PO Take by mouth. Takes 2-3 times per week in shakes   . furosemide (LASIX) 20 MG tablet Take 1 tablet (20 mg total) by mouth daily.   . Ginger, Zingiber officinalis, (GINGER PO) Take by mouth.   . metoprolol succinate (TOPROL-XL) 25 MG 24 hr tablet Take 0.5 tablets (12.5 mg total) by mouth daily.   . Multiple Vitamins-Minerals (MULTIVITAMIN/EXTRA VITAMIN D3 PO) Take by mouth.    . naproxen sodium (ALEVE) 220 MG tablet Take 440 mg by mouth at bedtime.    Marland Kitchen omeprazole (PRILOSEC) 40 MG capsule Take 40 mg by mouth daily.    Marland Kitchen Specialty Vitamins Products (MAGNESIUM, AMINO ACID CHELATE,) 133 MG tablet Take 2-3 tablets by mouth as directed. Takes most evenings for leg pain   . TURMERIC PO Take by mouth. Does not take daily    No facility-administered encounter medications on file as of 10/30/2019.     Objective:  BP Readings from Last 3 Encounters:  08/14/19 110/78  07/08/19 128/76  06/20/19 (!) 144/72    Goals Addressed              This Visit's Progress   .  RNCM: Pt- "I try to live a healthy lifestyle" (pt-stated)        CARE PLAN ENTRY (see longtitudinal plan of care for additional care plan information)  Current Barriers:  . Chronic Disease Management support, education, and care coordination needs related to  HLD, Depression, and Asthma   Clinical Goal(s) related to HLD, Depression, and Asthma :  Over the next 120 days, patient will:  . Work with the care management team to address educational, disease management, and care coordination needs  . Begin or continue self health monitoring activities as directed today  improve activity level as tolerated, and monitor for changes in allergies and breathing issues related to Asthma  . Call provider office for new or worsened signs and symptoms Oxygen saturation lower than established parameter, Shortness of breath, and New or worsened symptom related to depression or change in cardiac  functions . Call care management team with questions or concerns . Verbalize basic understanding of patient centered plan of care established today  Interventions related to HLD, Depression, and Asthma :  . Evaluation of current treatment plans and patient's adherence to plan as established by provider.  10-30-2019: The patient states that she saw the pcp on 10-15-2019 and she is doing better but the cough is still there. She saw specialist last week and he told her she had "lung disease" and recommended that she get the booster for COVID 19.  The patient verbalized she will do this. She is currently at the beach with her niece.  . Assessed patient understanding of disease states.  The patient has a good understanding of her health conditions and feels they are under good control at this time.  . Assessed patient's education and care coordination needs: The patient states that she needs refills for her inhalers. The patient ask if Dr. [name] could refill her Ventolin (Albuterol, Proventil HFA; Ventolin HFA 108 mcg/ACT inhaler) inhaler and Qvar.  The patient is out of both and is no longer going to the pulmonary doctor at [facility]. Will collaborate with Dr. [name] concerning the patients request. Involve pharmacy support as needed. The patient states she has not heard back from the pharmacy. Wanted to know if this could be addressed again with pcp. Also ask for a refill for prescription cough medication.  The patient verbalized she has not had any in over a year but needs a refill because sometimes she starts coughing and this is the only thing that helps her stop. Will send in basket message to pcp and pharmacist for assistance. 10-30-2019: The patient has adequate refills and denies any needs for refills at this time. The patient verbalized that her father had a lot of issues with his breathing and she has resolved that she will likely have to deal with the breathing issues she is having. She says she just gets  frustrated because she is always clearing her throat and some days she has lesser symptoms and sometimes she has worse. She is trying to work with the providers to see what will work best for her in managing her symptoms best.  . Provided disease specific education to patient: the patient is following a heart healthy diet and loves to cook. . Evaluation of recent Echocardiogram. The patients EF is 60-65%.  The patient verbalized they told her there is nothing wrong with her heart.  Nash Dimmer with appropriate clinical care team members regarding patient needs- knows about LCSW and Pharmacist.   Patient Self Care Activities related to HLD, Depression, and Asthma :  . Patient is unable to independently self-manage chronic health conditions  Please see past updates related to this goal by clicking on the "Past Updates" button in the selected goal      .  COMPLETED: RNCM: Pt-"My right leg  is still hurting" (pt-stated)        CARE PLAN ENTRY (see longitudinal plan of care for additional care plan information)  Current Barriers: Goal completed. The patient had a cortisone injection and is doing much better . Knowledge Deficits related to etiology of right leg pain and staying tired "all" the time. The patient feels there is something wrong with her thyroid even though she has had testing and it shows no issues. She has a significant family history of thyroid issues.  . Care Coordination needs related to right leg pain and chronic fatigue in a patient with Osteopenia and other chronic conditions (disease states)  Nurse Case Manager Clinical Goal(s):  Marland Kitchen Over the next 120 days, patient will verbalize understanding of plan for support and education related to pain in right leg and chronic fatigue  . Over the next 120 days, patient will work with pcp and CCM team  to address needs related to right leg pain and chronic fatigue  . Over the next 120 days, patient will attend all scheduled medical  appointments: no upcoming appointments with the pcp but ask the patient when she returned from her vacation to call the office and make an appointment.   Interventions:  . Inter-disciplinary care team collaboration (see longitudinal plan of care) . Advised patient to discuss her concerns about the continued leg pain and discomfort and chronic fatigue with the pcp.  The patient believes there is a correlation with her thyroid to these problems. Has a significant family history of thyroid problems.  She is frustrated and doesn't understand why she feels the way she does. Sometimes her leg feels heavy like she can't even move it. She verbalized that she has not found anything to relieve the pain.  Denies use of DME or falls.  . Provided education to patient re: talking to pcp about recommendations. The patient states her niece was having the same symptoms as she does and got involved with a holistic provider and is now doing great. Education and support given. The patient states her mother, her siblings, her daughters, her nieces and other family members all have or had thyroid issues. She has been tested and they can not find anything wrong with her thyroid but she feels if she could go on a thyroid supplement it would make a huge difference in the way she feels. She states that she has to drink a lot of coffee and energy drinks to keep going.  Marland Kitchen Collaborated with pcp and CCM pharmacist  regarding right leg pain and chronic fatigue.  Marland Kitchen Discussed plans with patient for ongoing care management follow up and provided patient with direct contact information for care management team . Reviewed scheduled/upcoming provider appointments including: the patient to make an appointment to come in to be seen by the pcp upon arrival back home from vacation.   Patient Self Care Activities:  . Patient verbalizes understanding of plan to discuss her leg pain and chronic fatigue with the pcp for treatment and  recommendations . Attends all scheduled provider appointments . Calls provider office for new concerns or questions . Unable to independently manage right leg pain and chronic fatigue  Please see past updates related to this goal by clicking on the "Past Updates" button in the selected goal          Plan:   Telephone follow up appointment with care management team member scheduled for: 12-25-2019 at 3:15 pm   Noreene Larsson RN, MSN, [place]  [facility] Mobile: [phone]

## 2019-11-04 ENCOUNTER — Other Ambulatory Visit: Payer: Self-pay | Admitting: Family Medicine

## 2019-11-06 DIAGNOSIS — J301 Allergic rhinitis due to pollen: Secondary | ICD-10-CM | POA: Diagnosis not present

## 2019-11-13 DIAGNOSIS — J301 Allergic rhinitis due to pollen: Secondary | ICD-10-CM | POA: Diagnosis not present

## 2019-11-15 ENCOUNTER — Encounter: Payer: Self-pay | Admitting: Family

## 2019-11-20 DIAGNOSIS — J301 Allergic rhinitis due to pollen: Secondary | ICD-10-CM | POA: Diagnosis not present

## 2019-11-25 ENCOUNTER — Other Ambulatory Visit: Payer: Self-pay | Admitting: Family Medicine

## 2019-11-27 DIAGNOSIS — J301 Allergic rhinitis due to pollen: Secondary | ICD-10-CM | POA: Diagnosis not present

## 2019-12-04 DIAGNOSIS — H35372 Puckering of macula, left eye: Secondary | ICD-10-CM | POA: Diagnosis not present

## 2019-12-04 DIAGNOSIS — K5909 Other constipation: Secondary | ICD-10-CM | POA: Diagnosis not present

## 2019-12-04 DIAGNOSIS — Z7689 Persons encountering health services in other specified circumstances: Secondary | ICD-10-CM | POA: Diagnosis not present

## 2019-12-04 DIAGNOSIS — H35033 Hypertensive retinopathy, bilateral: Secondary | ICD-10-CM | POA: Diagnosis not present

## 2019-12-04 DIAGNOSIS — R296 Repeated falls: Secondary | ICD-10-CM | POA: Diagnosis not present

## 2019-12-04 DIAGNOSIS — E7800 Pure hypercholesterolemia, unspecified: Secondary | ICD-10-CM | POA: Diagnosis not present

## 2019-12-04 DIAGNOSIS — J301 Allergic rhinitis due to pollen: Secondary | ICD-10-CM | POA: Diagnosis not present

## 2019-12-04 DIAGNOSIS — Z79899 Other long term (current) drug therapy: Secondary | ICD-10-CM | POA: Diagnosis not present

## 2019-12-04 DIAGNOSIS — H25813 Combined forms of age-related cataract, bilateral: Secondary | ICD-10-CM | POA: Diagnosis not present

## 2019-12-04 DIAGNOSIS — E559 Vitamin D deficiency, unspecified: Secondary | ICD-10-CM | POA: Diagnosis not present

## 2019-12-04 DIAGNOSIS — R270 Ataxia, unspecified: Secondary | ICD-10-CM | POA: Diagnosis not present

## 2019-12-04 DIAGNOSIS — I10 Essential (primary) hypertension: Secondary | ICD-10-CM | POA: Diagnosis not present

## 2019-12-11 DIAGNOSIS — J301 Allergic rhinitis due to pollen: Secondary | ICD-10-CM | POA: Diagnosis not present

## 2019-12-16 ENCOUNTER — Ambulatory Visit (INDEPENDENT_AMBULATORY_CARE_PROVIDER_SITE_OTHER): Payer: PPO | Admitting: Internal Medicine

## 2019-12-16 ENCOUNTER — Other Ambulatory Visit: Payer: Self-pay

## 2019-12-16 ENCOUNTER — Encounter: Payer: Self-pay | Admitting: Internal Medicine

## 2019-12-16 VITALS — BP 128/74 | HR 67 | Ht 64.5 in | Wt 159.0 lb

## 2019-12-16 DIAGNOSIS — I5032 Chronic diastolic (congestive) heart failure: Secondary | ICD-10-CM

## 2019-12-16 DIAGNOSIS — E785 Hyperlipidemia, unspecified: Secondary | ICD-10-CM

## 2019-12-16 DIAGNOSIS — I471 Supraventricular tachycardia: Secondary | ICD-10-CM

## 2019-12-16 NOTE — Progress Notes (Signed)
Follow-up Outpatient Visit Date: 12/16/2019  Primary Care Provider: [name], [place] [address]  Chief Complaint: Follow-up SVT and hyperlipidemia  HPI:  Andrea Savage is a 74 y.o. female with history of PSVT, recurrent syncope/near syncope, possible asthma, fibromyalgia, depression, and postherpetic neuralgia, who presents for follow-up of PSVT and syncope.  She was seen multiple times in the spring with concerns about fatigue, leg pain, and cough.  Myalgias resolved after discontinuation of rosuvastatin.  She was placed on ezetimibe and tolerating this well.  No medication changes were made.    Today, Andrea Savage reports that she has experienced some recent falls.  She attributes this to leg weakness as well as vision problems related to cataracts and associated disruption of her depth perception.  She notes that her leg myalgias have improved with discontinuation of rosuvastatin.  She was recently prescribed bempedoic acid/ezetimibe by her PCP but is unable to afford this.  She reports stable asthma symptoms that have been a longstanding.  She denies chest pain, palpitations, lightheadedness, and edema.  --------------------------------------------------------------------------------------------------  Past Medical History:  Diagnosis Date  . Asthma   . Depression   . Fibromyalgia   . GERD (gastroesophageal reflux disease)   . H/O scarlet fever    as child  . Heart murmur    s/p scarlet fever as child  . IBS (irritable bowel syndrome)   . Low bone density   . Osteopenia   . Reflux   . Rosacea   . Wears hearing aid in both ears    Past Surgical History:  Procedure Laterality Date  . ABDOMINAL HYSTERECTOMY  2009   Total  . BREAST EXCISIONAL BIOPSY Left 1979   NEG  . COLONOSCOPY  [id number]   repeat 10 years  . EXCISION MORTON'S NEUROMA Left 09/21/2017   Procedure: EXCISION MORTON'S NEUROMA;  Surgeon: Albertine Patricia, DPM;  Location:  [place];  Service: Podiatry;  Laterality: Left;  . FOOT SURGERY Right 1990  . HAND SURGERY Right   . LAPAROSCOPIC OOPHERECTOMY    . MINOR HARDWARE REMOVAL Left 11/22/2018   Procedure: REMOVAL SCREW;DEEP 2nd and 3RD LEFT CAPSULE RELEASE OF THIRD METATARSAL PHALANGEAL JOINT LEFT, TENDON LENGTHING THIRD TOE LEFT AND CORTISONE INJECTION LEFT;  Surgeon: Albertine Patricia, DPM;  Location: [place];  Service: Podiatry;  Laterality: Left;  LMA OR IVA LOCAL  . TONSILLECTOMY    . WEIL OSTEOTOMY Left 09/21/2017   Procedure: WEIL OSTEOTOMY-2ND & 3RD TOES;  Surgeon: Albertine Patricia, DPM;  Location: [place];  Service: Podiatry;  Laterality: Left;  LMA WITH LOCAL MINI MONSTER DR TROXLER WANTS TED HOSE ON PATIENT'S RIGHT LEG    Current Meds  Medication Sig  . albuterol (VENTOLIN HFA) 108 (90 Base) MCG/ACT inhaler Inhale 2 puffs into the lungs every 6 (six) hours as needed for wheezing or shortness of breath.  . Ascorbic Acid (VITAMIN C) 100 MG tablet Take 100 mg by mouth daily.  Marland Kitchen aspirin EC 81 MG tablet Take 1 tablet (81 mg total) by mouth daily.  . busPIRone (BUSPAR) 5 MG tablet Take 1 tablet (5 mg total) by mouth daily.  Marland Kitchen CALCIUM-MAGNESIUM-ZINC PO Take by mouth daily.  . cetirizine (ZYRTEC) 10 MG tablet Take 10 mg by mouth daily.  . citalopram (CELEXA) 40 MG tablet Take 1 tablet by mouth daily.  . COLLAGEN PO Take by mouth. Takes 2-3 times per week in shakes  . furosemide (LASIX) 20 MG tablet Take 1 tablet (20  mg total) by mouth daily.  . metoprolol succinate (TOPROL-XL) 25 MG 24 hr tablet Take 0.5 tablets (12.5 mg total) by mouth daily.  . naproxen sodium (ALEVE) 220 MG tablet Take 440 mg by mouth at bedtime.   Marland Kitchen omeprazole (PRILOSEC) 40 MG capsule Take 40 mg by mouth daily.   Marland Kitchen Specialty Vitamins Products (MAGNESIUM, AMINO ACID CHELATE,) 133 MG tablet Take 2-3 tablets by mouth as directed. Takes most evenings for leg pain    Allergies: Compazine [prochlorperazine]  and Dust mite extract  Social History   Tobacco Use  . Smoking status: Never Smoker  . Smokeless tobacco: Never Used  Vaping Use  . Vaping Use: Never used  Substance Use Topics  . Alcohol use: Yes    Alcohol/week: 1.0 standard drink    Types: 1 Glasses of wine per week    Comment: on occasion  . Drug use: No    Family History  Problem Relation Age of Onset  . Cancer Mother        breast  . Breast cancer Mother 73  . Cancer Father        pancreatic  . Asthma Father   . Heart disease Father   . Liver disease Sister   . Diabetes Sister   . Thyroid disease Sister   . Thyroid disease Brother   . Kidney disease Brother     Review of Systems: A 12-system review of systems was performed and was negative except as noted in the HPI.  --------------------------------------------------------------------------------------------------  Physical Exam: BP 128/74   Pulse 67   Ht 5' 4.5" (1.638 m)   Wt 159 lb (72.1 kg)   BMI 26.87 kg/m   General: NAD. Neck: Supple without lymphadenopathy, thyromegaly, JVD, or HJR. Lungs: Normal work of breathing.  Scattered inspiratory wheezes.  No crackles. Heart: Regular rate and rhythm without murmurs, rubs, or gallops. Abd: Bowel sounds present. Soft, NT/ND.  Ext: No lower extremity edema. Radial, PT, and DP pulses are 2+ bilaterally.  EKG: Normal sinus rhythm without abnormality.  Lab Results  Component Value Date   WBC 5.4 06/20/2019   HGB 13.7 06/20/2019   HCT 40.3 06/20/2019   MCV 95 06/20/2019   PLT 188 06/20/2019    Lab Results  Component Value Date   NA 139 07/01/2019   K 4.3 07/01/2019   CL 101 07/01/2019   CO2 29 07/01/2019   BUN 18 07/01/2019   CREATININE 0.83 07/01/2019   GLUCOSE 106 (H) 07/01/2019   ALT 17 09/30/2019    Lab Results  Component Value Date   CHOL 219 (H) 09/30/2019   HDL 76 09/30/2019   LDLCALC 128 (H) 09/30/2019   LDLDIRECT 149 (H) 06/20/2019   TRIG 75 09/30/2019   CHOLHDL 2.9 09/30/2019      --------------------------------------------------------------------------------------------------  ASSESSMENT AND PLAN: PSVT: No palpitations reported.  Continue current medications, including metoprolol succinate 12.5 mg daily.  Hyperlipidemia: LDL suboptimally controlled in the setting of intolerance to multiple statins.  Bempedoic acid/ezetimibe was recently prescribed but will be cost prohibitive for Andrea Savage.  We agreed to work on lifestyle modifications.  Chronic HFpEF: Andrea Savage appears euvolemic with stable exertional dyspnea that is likely driven by her underlying asthma.  We will continue with as needed furosemide and low-dose metoprolol.  Follow-up: Return to clinic in 6 months.  Nelva Bush, MD 12/17/2019 8:40 PM

## 2019-12-16 NOTE — Patient Instructions (Signed)
Medication Instructions:  Your physician recommends that you continue on your current medications as directed. Please refer to the Current Medication list given to you today.  *If you need a refill on your cardiac medications before your next appointment, please call your pharmacy*  Follow-Up: At [facility], you and your health needs are our priority.  As part of our continuing mission to provide you with exceptional heart care, we have created designated Provider Care Teams.  These Care Teams include your primary Cardiologist (physician) and Advanced Practice Providers (APPs -  Physician Assistants and Nurse Practitioners) who all work together to provide you with the care you need, when you need it.  We recommend signing up for the patient portal called "MyChart".  Sign up information is provided on this After Visit Summary.  MyChart is used to connect with patients for Virtual Visits (Telemedicine).  Patients are able to view lab/test results, encounter notes, upcoming appointments, etc.  Non-urgent messages can be sent to your provider as well.   To learn more about what you can do with MyChart, go to [email or web address].    Your next appointment:   6 month(s)  The format for your next appointment:   In Person  Provider:   You may see Christopher End, MD or one of the following Advanced Practice Providers on your designated Care Team:    Christopher Berge, NP  Ryan Dunn, PA-C  Jacquelyn Visser, PA-C  Cadence Furth, PA-C   

## 2019-12-17 ENCOUNTER — Encounter: Payer: Self-pay | Admitting: Internal Medicine

## 2019-12-17 DIAGNOSIS — I5032 Chronic diastolic (congestive) heart failure: Secondary | ICD-10-CM | POA: Insufficient documentation

## 2019-12-18 DIAGNOSIS — J301 Allergic rhinitis due to pollen: Secondary | ICD-10-CM | POA: Diagnosis not present

## 2019-12-18 DIAGNOSIS — H2511 Age-related nuclear cataract, right eye: Secondary | ICD-10-CM | POA: Diagnosis not present

## 2019-12-18 DIAGNOSIS — H52221 Regular astigmatism, right eye: Secondary | ICD-10-CM | POA: Diagnosis not present

## 2019-12-18 DIAGNOSIS — Z01818 Encounter for other preprocedural examination: Secondary | ICD-10-CM | POA: Diagnosis not present

## 2019-12-23 DIAGNOSIS — H2511 Age-related nuclear cataract, right eye: Secondary | ICD-10-CM | POA: Diagnosis not present

## 2019-12-25 ENCOUNTER — Ambulatory Visit: Payer: Self-pay | Admitting: General Practice

## 2019-12-25 ENCOUNTER — Telehealth: Payer: Self-pay | Admitting: General Practice

## 2019-12-25 DIAGNOSIS — F3341 Major depressive disorder, recurrent, in partial remission: Secondary | ICD-10-CM

## 2019-12-25 DIAGNOSIS — E782 Mixed hyperlipidemia: Secondary | ICD-10-CM

## 2019-12-25 DIAGNOSIS — J301 Allergic rhinitis due to pollen: Secondary | ICD-10-CM | POA: Diagnosis not present

## 2019-12-25 DIAGNOSIS — J4540 Moderate persistent asthma, uncomplicated: Secondary | ICD-10-CM

## 2019-12-25 NOTE — Patient Instructions (Signed)
Visit Information  Goals Addressed              This Visit's Progress     COMPLETED: RNCM: Pt- "I try to live a healthy lifestyle" (pt-stated)        [place] (see longtitudinal plan of care for additional care plan information)  Current Barriers: Care plan completed.  The patient has transferred to a new pcp, [name] at [facility]  Chronic Disease Management support, education, and care coordination needs related to HLD, Depression, and Asthma   Clinical Goal(s) related to HLD, Depression, and Asthma :  Over the next 120 days, patient will:   Work with the care management team to address educational, disease management, and care coordination needs   Begin or continue self health monitoring activities as directed today  improve activity level as tolerated, and monitor for changes in allergies and breathing issues related to Asthma   Call provider office for new or worsened signs and symptoms Oxygen saturation lower than established parameter, Shortness of breath, and New or worsened symptom related to depression or change in cardiac functions  Call care management team with questions or concerns  Verbalize basic understanding of patient centered plan of care established today  Interventions related to HLD, Depression, and Asthma :   Evaluation of current treatment plans and patient's adherence to plan as established by provider.  10-30-2019: The patient states that she saw the pcp on 10-15-2019 and she is doing better but the cough is still there. She saw specialist last week and he told her she had "lung disease" and recommended that she get the booster for COVID 19.  The patient verbalized she will do this. She is currently at the beach with her niece. 12-25-2019: The patient has decided to transfer her care to a new pcp. She has not notified [facility], but has see [name] at [facility]. The patient feels she is getting several things addressed and that she  needed this change. Education and support given.   Assessed patient understanding of disease states.  The patient has a good understanding of her health conditions and feels they are under good control at this time.   Assessed patient's education and care coordination needs: The patient states that she needs refills for her inhalers. The patient ask if Dr. [name] could refill her Ventolin (Albuterol, Proventil HFA; Ventolin HFA 108 mcg/ACT inhaler) inhaler and Qvar.  The patient is out of both and is no longer going to the pulmonary doctor at [facility]. Will collaborate with Dr. [name] concerning the patients request. Involve pharmacy support as needed. The patient states she has not heard back from the pharmacy. Wanted to know if this could be addressed again with pcp. Also ask for a refill for prescription cough medication.  The patient verbalized she has not had any in over a year but needs a refill because sometimes she starts coughing and this is the only thing that helps her stop. Will send in basket message to pcp and pharmacist for assistance. 10-30-2019: The patient has adequate refills and denies any needs for refills at this time. The patient verbalized that her father had a lot of issues with his breathing and she has resolved that she will likely have to deal with the breathing issues she is having. She says she just gets frustrated because she is always clearing her throat and some days she has lesser symptoms and sometimes she has worse. She is trying to work with  the providers to see what will work best for her in managing her symptoms best. 12-25-2019: the patient denies any further needs at this time. Explained CCM services and to discuss with her new practice about CCM team involvement. The patient verbalized understanding that the [facility] would not be calling her again.    Provided disease specific education to patient: the patient is following a heart healthy diet and loves to cook.  Evaluation  of recent Echocardiogram. The patients EF is 60-65%.  The patient verbalized they told her there is nothing wrong with her heart.   Collaborated with appropriate clinical care team members regarding patient needs- knows about LCSW and Pharmacist.   The patient states she had a bad fall about a month ago at church but she is doing better now. She had cataract surgery recently and will have the other eye done on 01-06-2020.  The patient feels she is getting answers to a lot of the things she needed and thanked the [facility] for care and reaching out. Closing care plan.   Patient Self Care Activities related to HLD, Depression, and Asthma :   Patient is unable to independently self-manage chronic health conditions  Please see past updates related to this goal by clicking on the "Past Updates" button in the selected goal         Patient verbalizes understanding of instructions provided today.   No further follow up required: patient has transferred her care to another pcp  [place], MSN, [facility] Mobile: [phone]

## 2019-12-25 NOTE — Chronic Care Management (AMB) (Signed)
Chronic Care Management   Follow Up Note   12/25/2019 Name: Andrea Savage MRN: [id number] DOB: [birth year]  Referred by: [name], MD Reason for referral : Chronic Care Management (RNCM: Chronic Disease Management and Care Coordination Needs)   Andrea Savage is a 74 y.o. year old female who is a primary care patient of [name], MD. The CCM team was consulted for assistance with chronic disease management and care coordination needs.    Review of patient status, including review of consultants reports, relevant laboratory and other test results, and collaboration with appropriate care team members and the patient's provider was performed as part of comprehensive patient evaluation and provision of chronic care management services.    SDOH (Social Determinants of Health) assessments performed: Yes See Care Plan activities for detailed interventions related to [facility])     Outpatient Encounter Medications as of 12/25/2019  Medication Sig  . albuterol (VENTOLIN HFA) 108 (90 Base) MCG/ACT inhaler Inhale 2 puffs into the lungs every 6 (six) hours as needed for wheezing or shortness of breath.  . Ascorbic Acid (VITAMIN C) 100 MG tablet Take 100 mg by mouth daily.  Marland Kitchen aspirin EC 81 MG tablet Take 1 tablet (81 mg total) by mouth daily.  . Bempedoic Acid-Ezetimibe (NEXLIZET) 180-10 MG TABS Take 1 tablet by mouth daily. (Patient not taking: Reported on 12/16/2019)  . busPIRone (BUSPAR) 5 MG tablet Take 1 tablet (5 mg total) by mouth daily.  Marland Kitchen CALCIUM-MAGNESIUM-ZINC PO Take by mouth daily.  . cetirizine (ZYRTEC) 10 MG tablet Take 10 mg by mouth daily.  . citalopram (CELEXA) 40 MG tablet Take 1 tablet by mouth daily.  . COLLAGEN PO Take by mouth. Takes 2-3 times per week in shakes  . furosemide (LASIX) 20 MG tablet Take 1 tablet (20 mg total) by mouth daily.  . metoprolol succinate (TOPROL-XL) 25 MG 24 hr tablet Take 0.5 tablets (12.5 mg total) by mouth daily.  .  naproxen sodium (ALEVE) 220 MG tablet Take 440 mg by mouth at bedtime.   Marland Kitchen omeprazole (PRILOSEC) 40 MG capsule Take 40 mg by mouth daily.   Marland Kitchen Specialty Vitamins Products (MAGNESIUM, AMINO ACID CHELATE,) 133 MG tablet Take 2-3 tablets by mouth as directed. Takes most evenings for leg pain   No facility-administered encounter medications on file as of 12/25/2019.     Objective:   Goals Addressed              This Visit's Progress   .  COMPLETED: RNCM: Pt- "I try to live a healthy lifestyle" (pt-stated)        [place] (see longtitudinal plan of care for additional care plan information)  Current Barriers: Care plan completed.  The patient has transferred to a new pcp, [name] at [facility] . Chronic Disease Management support, education, and care coordination needs related to HLD, Depression, and Asthma   Clinical Goal(s) related to HLD, Depression, and Asthma :  Over the next 120 days, patient will:  . Work with the care management team to address educational, disease management, and care coordination needs  . Begin or continue self health monitoring activities as directed today  improve activity level as tolerated, and monitor for changes in allergies and breathing issues related to Asthma  . Call provider office for new or worsened signs and symptoms Oxygen saturation lower than established parameter, Shortness of breath, and New or worsened symptom related to depression or change in cardiac functions . Call care management  team with questions or concerns . Verbalize basic understanding of patient centered plan of care established today  Interventions related to HLD, Depression, and Asthma :  . Evaluation of current treatment plans and patient's adherence to plan as established by provider.  10-30-2019: The patient states that she saw the pcp on 10-15-2019 and she is doing better but the cough is still there. She saw specialist last week and he told her she had "lung  disease" and recommended that she get the booster for COVID 19.  The patient verbalized she will do this. [place] the beach with [place]. 12-25-2019: The patient has decided to transfer her care to a new pcp. She has not notified [facility], but has see [name] at [facility]. The patient feels she is getting several things addressed and that she needed this change. Education and support given.  . Assessed patient understanding of disease states.  The patient has a good understanding of her health conditions and feels they are under good control at this time.  . Assessed patient's education and care coordination needs: The patient states that she needs refills for her inhalers. The patient ask if Dr. [name] could refill her Ventolin (Albuterol, Proventil HFA; Ventolin HFA 108 mcg/ACT inhaler) inhaler and Qvar.  The patient is out of both and is no longer going to the pulmonary doctor at [facility]. Will collaborate with Dr. [name] concerning the patients request. Involve pharmacy support as needed. The patient states she has not heard back from the pharmacy. Wanted to know if this could be addressed again with pcp. Also ask for a refill for prescription cough medication.  The patient verbalized she has not had any in over a year but needs a refill because sometimes she starts coughing and this is the only thing that helps her stop. Will send in basket message to pcp and pharmacist for assistance. 10-30-2019: The patient has adequate refills and denies any needs for refills at this time. The patient verbalized that her father had a lot of issues with his breathing and she has resolved that she will likely have to deal with the breathing issues she is having. She says she just gets frustrated because she is always clearing her throat and some days she has lesser symptoms and sometimes she has worse. She is trying to work with the providers to see what will work best for her in managing her symptoms best.  12-25-2019: the patient denies any further needs at this time. Explained CCM services and to discuss with her new practice about CCM team involvement. The patient verbalized understanding that the [facility] would not be calling her again.   . Provided disease specific education to patient: the patient is following a heart healthy diet and loves to cook. . Evaluation of recent Echocardiogram. The patients EF is 60-65%.  The patient verbalized they told her there is nothing wrong with her heart.  Nash Dimmer with appropriate clinical care team members regarding patient needs- knows about LCSW and Pharmacist.  . The patient states she had a bad fall about a month ago at church but she is doing better now. She had cataract surgery recently and will have the other eye done on 01-06-2020.  The patient feels she is getting answers to a lot of the things she needed and thanked the [facility] for care and reaching out. Closing care plan.   Patient Self Care Activities related to HLD, Depression, and Asthma :  . Patient is  unable to independently self-manage chronic health conditions  Please see past updates related to this goal by clicking on the "Past Updates" button in the selected goal          Plan:   No further follow up required: the patient has switched her care to a new pcp   [place], MSN, [facility] Mobile: [phone]

## 2019-12-27 ENCOUNTER — Other Ambulatory Visit: Payer: Self-pay | Admitting: Internal Medicine

## 2020-01-03 DIAGNOSIS — J301 Allergic rhinitis due to pollen: Secondary | ICD-10-CM | POA: Diagnosis not present

## 2020-01-06 DIAGNOSIS — H2512 Age-related nuclear cataract, left eye: Secondary | ICD-10-CM | POA: Diagnosis not present

## 2020-01-08 DIAGNOSIS — J301 Allergic rhinitis due to pollen: Secondary | ICD-10-CM | POA: Diagnosis not present

## 2020-01-15 ENCOUNTER — Other Ambulatory Visit (HOSPITAL_COMMUNITY): Payer: Self-pay | Admitting: Internal Medicine

## 2020-01-15 ENCOUNTER — Other Ambulatory Visit: Payer: Self-pay | Admitting: Internal Medicine

## 2020-01-15 DIAGNOSIS — J4520 Mild intermittent asthma, uncomplicated: Secondary | ICD-10-CM | POA: Diagnosis not present

## 2020-01-15 DIAGNOSIS — Z23 Encounter for immunization: Secondary | ICD-10-CM | POA: Diagnosis not present

## 2020-01-15 DIAGNOSIS — Z0000 Encounter for general adult medical examination without abnormal findings: Secondary | ICD-10-CM | POA: Diagnosis not present

## 2020-01-15 DIAGNOSIS — R053 Chronic cough: Secondary | ICD-10-CM | POA: Diagnosis not present

## 2020-01-15 DIAGNOSIS — J301 Allergic rhinitis due to pollen: Secondary | ICD-10-CM | POA: Diagnosis not present

## 2020-01-15 DIAGNOSIS — Z1231 Encounter for screening mammogram for malignant neoplasm of breast: Secondary | ICD-10-CM | POA: Diagnosis not present

## 2020-01-15 DIAGNOSIS — K219 Gastro-esophageal reflux disease without esophagitis: Secondary | ICD-10-CM | POA: Diagnosis not present

## 2020-01-15 DIAGNOSIS — F320 Major depressive disorder, single episode, mild: Secondary | ICD-10-CM | POA: Diagnosis not present

## 2020-01-15 DIAGNOSIS — H90A22 Sensorineural hearing loss, unilateral, left ear, with restricted hearing on the contralateral side: Secondary | ICD-10-CM | POA: Diagnosis not present

## 2020-01-15 DIAGNOSIS — E782 Mixed hyperlipidemia: Secondary | ICD-10-CM | POA: Diagnosis not present

## 2020-01-15 DIAGNOSIS — Z780 Asymptomatic menopausal state: Secondary | ICD-10-CM | POA: Diagnosis not present

## 2020-01-15 DIAGNOSIS — M797 Fibromyalgia: Secondary | ICD-10-CM | POA: Diagnosis not present

## 2020-01-15 DIAGNOSIS — Z79899 Other long term (current) drug therapy: Secondary | ICD-10-CM | POA: Diagnosis not present

## 2020-01-22 DIAGNOSIS — J301 Allergic rhinitis due to pollen: Secondary | ICD-10-CM | POA: Diagnosis not present

## 2020-01-22 DIAGNOSIS — M810 Age-related osteoporosis without current pathological fracture: Secondary | ICD-10-CM | POA: Diagnosis not present

## 2020-01-23 ENCOUNTER — Ambulatory Visit
Admission: RE | Admit: 2020-01-23 | Discharge: 2020-01-23 | Disposition: A | Discharge status: Home / Self Care | Payer: PPO | Source: Ambulatory Visit | Attending: Internal Medicine | Admitting: Internal Medicine

## 2020-01-23 ENCOUNTER — Other Ambulatory Visit: Payer: Self-pay

## 2020-01-23 DIAGNOSIS — R053 Chronic cough: Secondary | ICD-10-CM | POA: Diagnosis not present

## 2020-01-23 DIAGNOSIS — R059 Cough, unspecified: Secondary | ICD-10-CM | POA: Diagnosis not present

## 2020-01-29 DIAGNOSIS — J301 Allergic rhinitis due to pollen: Secondary | ICD-10-CM | POA: Diagnosis not present

## 2020-01-31 ENCOUNTER — Other Ambulatory Visit: Payer: Self-pay | Admitting: Family Medicine

## 2020-02-05 DIAGNOSIS — J301 Allergic rhinitis due to pollen: Secondary | ICD-10-CM | POA: Diagnosis not present

## 2020-02-06 DIAGNOSIS — M1711 Unilateral primary osteoarthritis, right knee: Secondary | ICD-10-CM | POA: Diagnosis not present

## 2020-02-06 DIAGNOSIS — M7051 Other bursitis of knee, right knee: Secondary | ICD-10-CM | POA: Diagnosis not present

## 2020-02-10 DIAGNOSIS — J383 Other diseases of vocal cords: Secondary | ICD-10-CM | POA: Diagnosis not present

## 2020-02-12 DIAGNOSIS — R198 Other specified symptoms and signs involving the digestive system and abdomen: Secondary | ICD-10-CM | POA: Diagnosis not present

## 2020-02-12 DIAGNOSIS — R140 Abdominal distension (gaseous): Secondary | ICD-10-CM | POA: Diagnosis not present

## 2020-02-12 DIAGNOSIS — K449 Diaphragmatic hernia without obstruction or gangrene: Secondary | ICD-10-CM | POA: Diagnosis not present

## 2020-02-12 DIAGNOSIS — R194 Change in bowel habit: Secondary | ICD-10-CM | POA: Diagnosis not present

## 2020-02-12 DIAGNOSIS — J301 Allergic rhinitis due to pollen: Secondary | ICD-10-CM | POA: Diagnosis not present

## 2020-02-12 DIAGNOSIS — K219 Gastro-esophageal reflux disease without esophagitis: Secondary | ICD-10-CM | POA: Diagnosis not present

## 2020-02-19 DIAGNOSIS — J301 Allergic rhinitis due to pollen: Secondary | ICD-10-CM | POA: Diagnosis not present

## 2020-02-26 DIAGNOSIS — J301 Allergic rhinitis due to pollen: Secondary | ICD-10-CM | POA: Diagnosis not present

## 2020-03-04 DIAGNOSIS — J301 Allergic rhinitis due to pollen: Secondary | ICD-10-CM | POA: Diagnosis not present

## 2020-03-11 DIAGNOSIS — J301 Allergic rhinitis due to pollen: Secondary | ICD-10-CM | POA: Diagnosis not present

## 2020-03-18 DIAGNOSIS — J301 Allergic rhinitis due to pollen: Secondary | ICD-10-CM | POA: Diagnosis not present

## 2020-03-20 ENCOUNTER — Other Ambulatory Visit: Payer: Self-pay

## 2020-03-20 ENCOUNTER — Other Ambulatory Visit
Admission: RE | Admit: 2020-03-20 | Discharge: 2020-03-20 | Disposition: A | Discharge status: Home / Self Care | Payer: PPO | Source: Ambulatory Visit | Attending: Gastroenterology | Admitting: Gastroenterology

## 2020-03-20 DIAGNOSIS — Z20822 Contact with and (suspected) exposure to covid-19: Secondary | ICD-10-CM | POA: Insufficient documentation

## 2020-03-20 DIAGNOSIS — Z01812 Encounter for preprocedural laboratory examination: Secondary | ICD-10-CM | POA: Diagnosis not present

## 2020-03-20 LAB — SARS CORONAVIRUS 2 (TAT 6-24 HRS): SARS Coronavirus 2: NEGATIVE

## 2020-03-24 ENCOUNTER — Ambulatory Visit: Payer: PPO | Admitting: Anesthesiology

## 2020-03-24 ENCOUNTER — Ambulatory Visit
Admission: RE | Admit: 2020-03-24 | Discharge: 2020-03-24 | Disposition: A | Discharge status: Home / Self Care | Payer: PPO | Attending: Gastroenterology | Admitting: Gastroenterology

## 2020-03-24 ENCOUNTER — Other Ambulatory Visit: Payer: Self-pay

## 2020-03-24 ENCOUNTER — Encounter
Admission: RE | Disposition: A | Discharge status: Home / Self Care | Payer: Self-pay | Source: Home / Self Care | Attending: Gastroenterology

## 2020-03-24 ENCOUNTER — Encounter: Payer: Self-pay | Admitting: *Deleted

## 2020-03-24 DIAGNOSIS — K640 First degree hemorrhoids: Secondary | ICD-10-CM | POA: Diagnosis not present

## 2020-03-24 DIAGNOSIS — Z79899 Other long term (current) drug therapy: Secondary | ICD-10-CM | POA: Insufficient documentation

## 2020-03-24 DIAGNOSIS — K5790 Diverticulosis of intestine, part unspecified, without perforation or abscess without bleeding: Secondary | ICD-10-CM | POA: Diagnosis not present

## 2020-03-24 DIAGNOSIS — R194 Change in bowel habit: Secondary | ICD-10-CM | POA: Diagnosis not present

## 2020-03-24 DIAGNOSIS — Z7982 Long term (current) use of aspirin: Secondary | ICD-10-CM | POA: Diagnosis not present

## 2020-03-24 DIAGNOSIS — K219 Gastro-esophageal reflux disease without esophagitis: Secondary | ICD-10-CM | POA: Insufficient documentation

## 2020-03-24 DIAGNOSIS — R059 Cough, unspecified: Secondary | ICD-10-CM | POA: Insufficient documentation

## 2020-03-24 DIAGNOSIS — K449 Diaphragmatic hernia without obstruction or gangrene: Secondary | ICD-10-CM | POA: Diagnosis not present

## 2020-03-24 DIAGNOSIS — K5900 Constipation, unspecified: Secondary | ICD-10-CM | POA: Diagnosis not present

## 2020-03-24 DIAGNOSIS — K5730 Diverticulosis of large intestine without perforation or abscess without bleeding: Secondary | ICD-10-CM | POA: Diagnosis not present

## 2020-03-24 HISTORY — PX: ESOPHAGOGASTRODUODENOSCOPY (EGD) WITH PROPOFOL: SHX5813

## 2020-03-24 HISTORY — PX: COLONOSCOPY WITH PROPOFOL: SHX5780

## 2020-03-24 SURGERY — ESOPHAGOGASTRODUODENOSCOPY (EGD) WITH PROPOFOL
Anesthesia: General

## 2020-03-24 MED ORDER — PROPOFOL 500 MG/50ML IV EMUL
INTRAVENOUS | Status: DC | PRN
  Administered 2020-03-24: 200 ug/kg/min via INTRAVENOUS

## 2020-03-24 MED ORDER — PROPOFOL 10 MG/ML IV BOLUS
INTRAVENOUS | Status: DC | PRN
  Administered 2020-03-24: 50 mg via INTRAVENOUS

## 2020-03-24 MED ORDER — SODIUM CHLORIDE 0.9 % IV SOLN: INTRAVENOUS | Status: DC

## 2020-03-24 MED ORDER — IPRATROPIUM-ALBUTEROL 0.5-2.5 (3) MG/3ML IN SOLN
RESPIRATORY_TRACT | Status: AC
  Filled 2020-03-24: qty 3

## 2020-03-24 MED ORDER — ALBUTEROL SULFATE HFA 108 (90 BASE) MCG/ACT IN AERS
INHALATION_SPRAY | RESPIRATORY_TRACT | Status: DC | PRN
  Administered 2020-03-24: 5 via RESPIRATORY_TRACT

## 2020-03-24 NOTE — H&P (Signed)
Outpatient short stay form Pre-procedure 03/24/2020 10:51 AM Raylene Miyamoto MD, MPH  Primary Physician: [name]  Reason for visit:  Chronic Cough/Bowel habit changes  History of present illness:   75 y/o lady with history of chronic cough here for EGD and new onset constipation here for colonoscopy. No blood thinners. No family history of GI malignancies. History of hysterectomy. Denies heart burn but has a chronic cough. Denies overt dysphagia but endorses a crumb or piece of oatmeal can cause a coughing fit.    Current Facility-Administered Medications:  .  0.9 %  sodium chloride infusion, , Intravenous, Continuous, Carmelle Bamberg, Hilton Cork, MD  Medications Prior to Admission  Medication Sig Dispense Refill Last Dose  . albuterol (VENTOLIN HFA) 108 (90 Base) MCG/ACT inhaler Inhale 2 puffs into the lungs every 6 (six) hours as needed for wheezing or shortness of breath. 18 g 0 Past Month at Unknown time  . Ascorbic Acid (VITAMIN C) 100 MG tablet Take 100 mg by mouth daily.   Past Week at Unknown time  . aspirin EC 81 MG tablet Take 1 tablet (81 mg total) by mouth daily. 90 tablet 3 Past Week at Unknown time  . busPIRone (BUSPAR) 5 MG tablet Take 1 tablet (5 mg total) by mouth daily. 90 tablet 0 03/23/2020 at 0800  . CALCIUM-MAGNESIUM-ZINC PO Take by mouth daily.   Past Week at Unknown time  . cetirizine (ZYRTEC) 10 MG tablet Take 10 mg by mouth daily.   Past Week at Unknown time  . citalopram (CELEXA) 40 MG tablet Take 1 tablet by mouth daily. 90 tablet 0 03/23/2020 at 0/00  . COLLAGEN PO Take by mouth. Takes 2-3 times per week in shakes   Past Week at Unknown time  . metoprolol succinate (TOPROL-XL) 25 MG 24 hr tablet Take 0.5 tablets (12.5 mg total) by mouth daily. 45 tablet 0 03/24/2020 at 0700  . naproxen sodium (ALEVE) 220 MG tablet Take 440 mg by mouth at bedtime.    Past Week at Unknown time  . omeprazole (PRILOSEC) 40 MG capsule Take 40 mg by mouth daily.    03/23/2020 at 0800  .  Specialty Vitamins Products (MAGNESIUM, AMINO ACID CHELATE,) 133 MG tablet Take 2-3 tablets by mouth as directed. Takes most evenings for leg pain   Past Week at Unknown time  . Bempedoic Acid-Ezetimibe (NEXLIZET) 180-10 MG TABS Take 1 tablet by mouth daily. (Patient not taking: No sig reported) 30 tablet 5 Not Taking at Unknown time  . furosemide (LASIX) 20 MG tablet Take 1 tablet (20 mg total) by mouth daily. 90 tablet 3      Allergies  Allergen Reactions  . Compazine [Prochlorperazine] Other (See Comments)    Grand mal seizure  . Dust Mite Extract      Past Medical History:  Diagnosis Date  . Asthma   . Depression   . Fibromyalgia   . GERD (gastroesophageal reflux disease)   . H/O scarlet fever    as child  . Heart murmur    s/p scarlet fever as child  . IBS (irritable bowel syndrome)   . Low bone density   . Osteopenia   . Reflux   . Rosacea   . Wears hearing aid in both ears     Review of systems:  Otherwise negative.    Physical Exam  Gen: Alert, oriented. Appears stated age.  HEENT:PERRLA. Lungs: No respiratory distress CV: RRR Abd: soft, benign, no masses Ext: No edema  Planned procedures: Proceed with EGD/colonoscopy. The patient understands the nature of the planned procedure, indications, risks, alternatives and potential complications including but not limited to bleeding, infection, perforation, damage to internal organs and possible oversedation/side effects from anesthesia. The patient agrees and gives consent to proceed.  Please refer to procedure notes for findings, recommendations and patient disposition/instructions.     Raylene Miyamoto MD, MPH Gastroenterology 03/24/2020  10:51 AM

## 2020-03-24 NOTE — Transfer of Care (Signed)
Immediate Anesthesia Transfer of Care Note  Patient: Andrea Savage  Procedure(s) Performed: ESOPHAGOGASTRODUODENOSCOPY (EGD) WITH PROPOFOL (N/A ) COLONOSCOPY WITH PROPOFOL (N/A )  Patient Location: PACU  Anesthesia Type:General  Level of Consciousness: awake, alert  and oriented  Airway & Oxygen Therapy: Patient Spontanous Breathing, Patient connected to nasal cannula oxygen and Patient connected to face mask oxygen  Post-op Assessment: Report given to RN and Post -op Vital signs reviewed and stable  Post vital signs: Reviewed and stable  Last Vitals:  Vitals Value Taken Time  BP 154/133 03/24/20 1145  Temp 36.4 C 03/24/20 1144  Pulse 93 03/24/20 1147  Resp 15 03/24/20 1147  SpO2 100 % 03/24/20 1147  Vitals shown include unvalidated device data.  Last Pain:  Vitals:   03/24/20 1144  TempSrc: Temporal  PainSc:          Complications: No complications documented.

## 2020-03-24 NOTE — Anesthesia Procedure Notes (Signed)
Date/Time: 03/24/2020 11:31 AM Performed by: Nelda Marseille, CRNA Pre-anesthesia Checklist: Patient identified, Emergency Drugs available, Suction available, Patient being monitored and Timeout performed Oxygen Delivery Method: Simple face mask

## 2020-03-24 NOTE — Anesthesia Preprocedure Evaluation (Signed)
Anesthesia Evaluation  Patient identified by MRN, date of birth, ID band Patient awake    Reviewed: Allergy & Precautions, H&P , NPO status , Patient's Chart, lab work & pertinent test results, reviewed documented beta blocker date and time   History of Anesthesia Complications Negative for: history of anesthetic complications  Airway Mallampati: I  TM Distance: >3 FB Neck ROM: full    Dental no notable dental hx. (+) Dental Advidsory Given, Caps, Teeth Intact, Missing   Pulmonary neg shortness of breath, asthma , neg COPD, neg recent URI,    Pulmonary exam normal breath sounds clear to auscultation       Cardiovascular Exercise Tolerance: Good (-) hypertension(-) angina(-) Past MI and (-) Cardiac Stents Normal cardiovascular exam(-) dysrhythmias + Valvular Problems/Murmurs  Rhythm:regular Rate:Normal     Neuro/Psych neg Seizures PSYCHIATRIC DISORDERS Depression  Neuromuscular disease    GI/Hepatic Neg liver ROS, GERD  ,  Endo/Other  negative endocrine ROS  Renal/GU negative Renal ROS  negative genitourinary   Musculoskeletal   Abdominal   Peds  Hematology negative hematology ROS (+)   Anesthesia Other Findings Past Medical History: No date: Asthma No date: Depression No date: Fibromyalgia No date: GERD (gastroesophageal reflux disease) No date: H/O scarlet fever     Comment:  as child No date: Heart murmur     Comment:  s/p scarlet fever as child No date: IBS (irritable bowel syndrome) No date: Low bone density No date: Osteopenia No date: Reflux No date: Rosacea No date: Wears hearing aid in both ears   Reproductive/Obstetrics negative OB ROS                             Anesthesia Physical Anesthesia Plan  ASA: II  Anesthesia Plan: General   Post-op Pain Management:    Induction: Intravenous  PONV Risk Score and Plan: 3 and TIVA and Propofol infusion  Airway  Management Planned: Natural Airway and Nasal Cannula  Additional Equipment:   Intra-op Plan:   Post-operative Plan:   Informed Consent: I have reviewed the patients History and Physical, chart, labs and discussed the procedure including the risks, benefits and alternatives for the proposed anesthesia with the patient or authorized representative who has indicated his/her understanding and acceptance.     Dental Advisory Given  Plan Discussed with: Anesthesiologist, CRNA and Surgeon  Anesthesia Plan Comments:         Anesthesia Quick Evaluation

## 2020-03-24 NOTE — Op Note (Signed)
[facility] Patient Name: Andrea Savage Procedure Date: 03/24/2020 10:46 AM MRN: [id number] Account #: [id number] Date of Birth: 10/27/1945 Admit Type: Outpatient Age: 75 Room: [facility] ENDO ROOM 1 Gender: Female Note Status: Finalized Procedure:             Upper GI endoscopy Indications:           Gastro-esophageal reflux disease, Chronic cough Providers:             Andrey Farmer MD, MD Medicines:             Monitored Anesthesia Care Complications:         No immediate complications. Estimated blood loss:                         Minimal. Procedure:             Pre-Anesthesia Assessment:                        - Prior to the procedure, a History and Physical was                         performed, and patient medications and allergies were                         reviewed. The patient is competent. The risks and                         benefits of the procedure and the sedation options and                         risks were discussed with the patient. All questions                         were answered and informed consent was obtained.                         Patient identification and proposed procedure were                         verified by the physician, the nurse, the anesthetist                         and the technician in the endoscopy suite. Mental                         Status Examination: alert and oriented. Airway                         Examination: normal oropharyngeal airway and neck                         mobility. Respiratory Examination: clear to                         auscultation. CV Examination: normal. Prophylactic                         Antibiotics: The patient does not require prophylactic  antibiotics. Prior Anticoagulants: The patient has                         taken no previous anticoagulant or antiplatelet                         agents. ASA Grade Assessment: II - A patient with mild                          systemic disease. After reviewing the risks and                         benefits, the patient was deemed in satisfactory                         condition to undergo the procedure. The anesthesia                         plan was to use monitored anesthesia care (MAC).                         Immediately prior to administration of medications,                         the patient was re-assessed for adequacy to receive                         sedatives. The heart rate, respiratory rate, oxygen                         saturations, blood pressure, adequacy of pulmonary                         ventilation, and response to care were monitored                         throughout the procedure. The physical status of the                         patient was re-assessed after the procedure.                        After obtaining informed consent, the endoscope was                         passed under direct vision. Throughout the procedure,                         the patient's blood pressure, pulse, and oxygen                         saturations were monitored continuously. The Endoscope                         was introduced through the mouth, and advanced to the                         second part of duodenum. The upper GI endoscopy was  technically difficult and complex due to the patient's                         oxygen desaturation. Successful completion of the                         procedure was aided by managing the patient's medical                         instability. The patient tolerated the procedure well. Findings:      The examined esophagus was normal.      A medium-sized hiatal hernia was present.      The entire examined stomach was normal. Biopsies were taken with a cold       forceps for histology. Estimated blood loss was minimal.      The examined duodenum was normal. Impression:            - Normal esophagus.                         - Medium-sized hiatal hernia.                        - Normal stomach. Biopsied.                        - Normal examined duodenum.                        - Patient with very reactive airway. With passing of                         endoscope, precipitated a severe coughing fit and                         obstructive like physiology requiring airway mask.                         This is consistent with patient's reported symptoms of                         a crumb or piece of oatmeal precipitating a coughing                         fit. Patient without overt dysphagia or heartburn. Recommendation:        - Discharge patient to home.                        - Resume previous diet.                        - Continue present medications.                        - Await pathology results.                        - Return to referring physician as previously                         scheduled. Procedure  Code(s):     --- Professional ---                        [phone], Esophagogastroduodenoscopy, flexible,                         transoral; with biopsy, single or multiple Diagnosis Code(s):     --- Professional ---                        K44.9, Diaphragmatic hernia without obstruction or                         gangrene                        K21.9, Gastro-esophageal reflux disease without                         esophagitis                        R05, Cough CPT copyright 2019 [employer] Association. All rights reserved. The codes documented in this report are preliminary and upon coder review may  be revised to meet current compliance requirements. Andrey Farmer MD, MD 03/24/2020 11:44:30 AM Number of Addenda: 0 Note Initiated On: 03/24/2020 10:46 AM Estimated Blood Loss:  Estimated blood loss was minimal.      [facility]

## 2020-03-24 NOTE — Op Note (Signed)
[facility] Gastroenterology Patient Name: Andrea Savage Procedure Date: 03/24/2020 10:46 AM MRN: [id number] Account #: [id number] Date of Birth: 1946/02/01 Admit Type: Outpatient Age: 75 Room: [facility] ENDO ROOM 1 Gender: Female Note Status: Finalized Procedure:             Colonoscopy Indications:           Change in bowel habits, Constipation Providers:             Andrey Farmer MD, MD Medicines:             Monitored Anesthesia Care Complications:         No immediate complications. Procedure:             Pre-Anesthesia Assessment:                        - Prior to the procedure, a History and Physical was                         performed, and patient medications and allergies were                         reviewed. The patient is competent. The risks and                         benefits of the procedure and the sedation options and                         risks were discussed with the patient. All questions                         were answered and informed consent was obtained.                         Patient identification and proposed procedure were                         verified by the physician, the nurse, the anesthetist                         and the technician in the endoscopy suite. Mental                         Status Examination: alert and oriented. Airway                         Examination: normal oropharyngeal airway and neck                         mobility. Respiratory Examination: clear to                         auscultation. CV Examination: normal. Prophylactic                         Antibiotics: The patient does not require prophylactic                         antibiotics. Prior Anticoagulants: The patient has  taken no previous anticoagulant or antiplatelet                         agents. ASA Grade Assessment: II - A patient with mild                         systemic disease. After reviewing the risks and                          benefits, the patient was deemed in satisfactory                         condition to undergo the procedure. The anesthesia                         plan was to use monitored anesthesia care (MAC).                         Immediately prior to administration of medications,                         the patient was re-assessed for adequacy to receive                         sedatives. The heart rate, respiratory rate, oxygen                         saturations, blood pressure, adequacy of pulmonary                         ventilation, and response to care were monitored                         throughout the procedure. The physical status of the                         patient was re-assessed after the procedure.                        After obtaining informed consent, the colonoscope was                         passed under direct vision. Throughout the procedure,                         the patient's blood pressure, pulse, and oxygen                         saturations were monitored continuously. The                         Colonoscope was introduced through the anus and                         advanced to the the cecum, identified by appendiceal                         orifice and ileocecal valve. The colonoscopy was  performed without difficulty. The patient tolerated                         the procedure well. The quality of the bowel                         preparation was good. Findings:      The perianal and digital rectal examinations were normal.      A few small-mouthed diverticula were found in the sigmoid colon.      Internal hemorrhoids were found during retroflexion. The hemorrhoids       were Grade I (internal hemorrhoids that do not prolapse).      The exam was otherwise without abnormality on direct and retroflexion       views. Impression:            - Diverticulosis in the sigmoid colon.                        - Internal  hemorrhoids.                        - The examination was otherwise normal on direct and                         retroflexion views.                        - No specimens collected. Recommendation:        - Discharge patient to home.                        - Resume previous diet.                        - Continue present medications.                        - Repeat colonoscopy is not recommended due to current                         age (18 years or older) for screening purposes.                        - Return to referring physician as previously                         scheduled. Procedure Code(s):     --- Professional ---                        [id number], Colonoscopy, flexible; diagnostic, including                         collection of specimen(s) by brushing or washing, when                         performed (separate procedure) Diagnosis Code(s):     --- Professional ---                        K64.0, First degree hemorrhoids  R19.4, Change in bowel habit                        K59.00, Constipation, unspecified                        K57.30, Diverticulosis of large intestine without                         perforation or abscess without bleeding CPT copyright 2019 American Medical Association. All rights reserved. The codes documented in this report are preliminary and upon coder review may  be revised to meet current compliance requirements. Andrey Farmer MD, MD 03/24/2020 11:48:05 AM Number of Addenda: 0 Note Initiated On: 03/24/2020 10:46 AM Scope Withdrawal Time: 0 hours 11 minutes 9 seconds  Total Procedure Duration: 0 hours 18 minutes 53 seconds  Estimated Blood Loss:  Estimated blood loss: none.      [facility]

## 2020-03-24 NOTE — Interval H&P Note (Signed)
History and Physical Interval Note:  03/24/2020 10:54 AM  Andrea Savage  has presented today for surgery, with the diagnosis of CHANGES IN BOWEL HABITS,GERD.  The various methods of treatment have been discussed with the patient and family. After consideration of risks, benefits and other options for treatment, the patient has consented to  Procedure(s): ESOPHAGOGASTRODUODENOSCOPY (EGD) WITH PROPOFOL (N/A) COLONOSCOPY WITH PROPOFOL (N/A) as a surgical intervention.  The patient's history has been reviewed, patient examined, no change in status, stable for surgery.  I have reviewed the patient's chart and labs.  Questions were answered to the patient's satisfaction.     Lesly Rubenstein  Ok to proceed with EGD/Colonoscopy

## 2020-03-24 NOTE — Anesthesia Postprocedure Evaluation (Signed)
Anesthesia Post Note  Patient: Andrea Savage  Procedure(s) Performed: ESOPHAGOGASTRODUODENOSCOPY (EGD) WITH PROPOFOL (N/A ) COLONOSCOPY WITH PROPOFOL (N/A )  Patient location during evaluation: Endoscopy Anesthesia Type: General Level of consciousness: awake and alert Pain management: pain level controlled Vital Signs Assessment: post-procedure vital signs reviewed and stable Respiratory status: spontaneous breathing, nonlabored ventilation, respiratory function stable and patient connected to nasal cannula oxygen Cardiovascular status: blood pressure returned to baseline and stable Postop Assessment: no apparent nausea or vomiting Anesthetic complications: no   No complications documented.   Last Vitals:  Vitals:   03/24/20 1214 03/24/20 1224  BP: 128/67 127/68  Pulse: 81 82  Resp: 13 19  Temp:    SpO2: 97% 94%    Last Pain:  Vitals:   03/24/20 1214  TempSrc:   PainSc: 0-No pain                 Martha Clan

## 2020-03-25 ENCOUNTER — Encounter: Payer: Self-pay | Admitting: Gastroenterology

## 2020-03-25 DIAGNOSIS — J301 Allergic rhinitis due to pollen: Secondary | ICD-10-CM | POA: Diagnosis not present

## 2020-03-25 LAB — SURGICAL PATHOLOGY

## 2020-03-25 NOTE — Progress Notes (Signed)
C/O ''SORE THROAT'' ADVISED TO GARGLE WITH WARM SALT WATER AND TAKE TYLENOL. SHE IS TO CALL DR Haig Prophet AND LET HIM KNOW THAT SHE HAS ;; SORE THROAT ''.

## 2020-04-01 DIAGNOSIS — J301 Allergic rhinitis due to pollen: Secondary | ICD-10-CM | POA: Diagnosis not present

## 2020-04-06 ENCOUNTER — Other Ambulatory Visit: Payer: Self-pay | Admitting: Internal Medicine

## 2020-04-06 NOTE — Telephone Encounter (Signed)
Rx request sent to pharmacy.  

## 2020-04-10 DIAGNOSIS — M1711 Unilateral primary osteoarthritis, right knee: Secondary | ICD-10-CM | POA: Diagnosis not present

## 2020-04-15 DIAGNOSIS — J301 Allergic rhinitis due to pollen: Secondary | ICD-10-CM | POA: Diagnosis not present

## 2020-04-17 DIAGNOSIS — K219 Gastro-esophageal reflux disease without esophagitis: Secondary | ICD-10-CM | POA: Diagnosis not present

## 2020-04-17 DIAGNOSIS — M797 Fibromyalgia: Secondary | ICD-10-CM | POA: Diagnosis not present

## 2020-04-17 DIAGNOSIS — Z1231 Encounter for screening mammogram for malignant neoplasm of breast: Secondary | ICD-10-CM | POA: Diagnosis not present

## 2020-04-17 DIAGNOSIS — R635 Abnormal weight gain: Secondary | ICD-10-CM | POA: Diagnosis not present

## 2020-04-17 DIAGNOSIS — R053 Chronic cough: Secondary | ICD-10-CM | POA: Diagnosis not present

## 2020-04-17 DIAGNOSIS — E559 Vitamin D deficiency, unspecified: Secondary | ICD-10-CM | POA: Diagnosis not present

## 2020-04-22 DIAGNOSIS — M1711 Unilateral primary osteoarthritis, right knee: Secondary | ICD-10-CM | POA: Diagnosis not present

## 2020-04-22 DIAGNOSIS — J301 Allergic rhinitis due to pollen: Secondary | ICD-10-CM | POA: Diagnosis not present

## 2020-04-23 DIAGNOSIS — M6281 Muscle weakness (generalized): Secondary | ICD-10-CM | POA: Diagnosis not present

## 2020-04-23 DIAGNOSIS — M1711 Unilateral primary osteoarthritis, right knee: Secondary | ICD-10-CM | POA: Diagnosis not present

## 2020-04-23 DIAGNOSIS — M25661 Stiffness of right knee, not elsewhere classified: Secondary | ICD-10-CM | POA: Diagnosis not present

## 2020-05-01 DIAGNOSIS — M1711 Unilateral primary osteoarthritis, right knee: Secondary | ICD-10-CM | POA: Diagnosis not present

## 2020-05-06 DIAGNOSIS — J301 Allergic rhinitis due to pollen: Secondary | ICD-10-CM | POA: Diagnosis not present

## 2020-05-06 DIAGNOSIS — M65341 Trigger finger, right ring finger: Secondary | ICD-10-CM | POA: Diagnosis not present

## 2020-05-06 DIAGNOSIS — M1711 Unilateral primary osteoarthritis, right knee: Secondary | ICD-10-CM | POA: Diagnosis not present

## 2020-05-13 DIAGNOSIS — M25661 Stiffness of right knee, not elsewhere classified: Secondary | ICD-10-CM | POA: Diagnosis not present

## 2020-05-13 DIAGNOSIS — M6281 Muscle weakness (generalized): Secondary | ICD-10-CM | POA: Diagnosis not present

## 2020-05-13 DIAGNOSIS — M1711 Unilateral primary osteoarthritis, right knee: Secondary | ICD-10-CM | POA: Diagnosis not present

## 2020-05-13 DIAGNOSIS — J301 Allergic rhinitis due to pollen: Secondary | ICD-10-CM | POA: Diagnosis not present

## 2020-05-20 DIAGNOSIS — M1711 Unilateral primary osteoarthritis, right knee: Secondary | ICD-10-CM | POA: Diagnosis not present

## 2020-05-20 DIAGNOSIS — J301 Allergic rhinitis due to pollen: Secondary | ICD-10-CM | POA: Diagnosis not present

## 2020-05-20 DIAGNOSIS — M25661 Stiffness of right knee, not elsewhere classified: Secondary | ICD-10-CM | POA: Diagnosis not present

## 2020-05-20 DIAGNOSIS — M6281 Muscle weakness (generalized): Secondary | ICD-10-CM | POA: Diagnosis not present

## 2020-05-27 DIAGNOSIS — M1711 Unilateral primary osteoarthritis, right knee: Secondary | ICD-10-CM | POA: Diagnosis not present

## 2020-05-27 DIAGNOSIS — J301 Allergic rhinitis due to pollen: Secondary | ICD-10-CM | POA: Diagnosis not present

## 2020-05-27 DIAGNOSIS — M6281 Muscle weakness (generalized): Secondary | ICD-10-CM | POA: Diagnosis not present

## 2020-05-27 DIAGNOSIS — M25661 Stiffness of right knee, not elsewhere classified: Secondary | ICD-10-CM | POA: Diagnosis not present

## 2020-06-03 DIAGNOSIS — J301 Allergic rhinitis due to pollen: Secondary | ICD-10-CM | POA: Diagnosis not present

## 2020-06-03 DIAGNOSIS — M25661 Stiffness of right knee, not elsewhere classified: Secondary | ICD-10-CM | POA: Diagnosis not present

## 2020-06-03 DIAGNOSIS — M6281 Muscle weakness (generalized): Secondary | ICD-10-CM | POA: Diagnosis not present

## 2020-06-03 DIAGNOSIS — M1711 Unilateral primary osteoarthritis, right knee: Secondary | ICD-10-CM | POA: Diagnosis not present

## 2020-06-10 DIAGNOSIS — J301 Allergic rhinitis due to pollen: Secondary | ICD-10-CM | POA: Diagnosis not present

## 2020-06-12 DIAGNOSIS — J301 Allergic rhinitis due to pollen: Secondary | ICD-10-CM | POA: Diagnosis not present

## 2020-06-15 DIAGNOSIS — H903 Sensorineural hearing loss, bilateral: Secondary | ICD-10-CM | POA: Diagnosis not present

## 2020-06-17 DIAGNOSIS — J301 Allergic rhinitis due to pollen: Secondary | ICD-10-CM | POA: Diagnosis not present

## 2020-06-17 DIAGNOSIS — R053 Chronic cough: Secondary | ICD-10-CM | POA: Diagnosis not present

## 2020-06-17 DIAGNOSIS — M6281 Muscle weakness (generalized): Secondary | ICD-10-CM | POA: Diagnosis not present

## 2020-06-17 DIAGNOSIS — M25661 Stiffness of right knee, not elsewhere classified: Secondary | ICD-10-CM | POA: Diagnosis not present

## 2020-06-17 DIAGNOSIS — M1711 Unilateral primary osteoarthritis, right knee: Secondary | ICD-10-CM | POA: Diagnosis not present

## 2020-06-18 ENCOUNTER — Encounter: Payer: Self-pay | Admitting: Family

## 2020-06-18 ENCOUNTER — Ambulatory Visit (INDEPENDENT_AMBULATORY_CARE_PROVIDER_SITE_OTHER): Payer: PPO | Admitting: Family

## 2020-06-18 VITALS — BP 124/74 | HR 78 | Ht 64.0 in | Wt 166.0 lb

## 2020-06-18 DIAGNOSIS — R600 Localized edema: Secondary | ICD-10-CM | POA: Diagnosis not present

## 2020-06-18 DIAGNOSIS — I471 Supraventricular tachycardia: Secondary | ICD-10-CM

## 2020-06-18 DIAGNOSIS — E782 Mixed hyperlipidemia: Secondary | ICD-10-CM

## 2020-06-18 DIAGNOSIS — I5032 Chronic diastolic (congestive) heart failure: Secondary | ICD-10-CM

## 2020-06-18 MED ORDER — FUROSEMIDE 20 MG PO TABS: 20.0000 mg | ORAL_TABLET | Freq: Every day | ORAL | 3 refills | Status: DC

## 2020-06-18 MED ORDER — EZETIMIBE 10 MG PO TABS: 10.0000 mg | ORAL_TABLET | Freq: Every day | ORAL | 5 refills | Status: DC

## 2020-06-18 NOTE — Patient Instructions (Signed)
Medication Instructions:  Your physician has recommended you make the following change in your medication:   START Ezetimibe (Zetia) 10mg  daily *this is to help with your cholesterol*  *If you need a refill on your cardiac medications before your next appointment, please call your pharmacy*   Lab Work: None ordered today.  Testing/Procedures: None ordered today.  Follow-Up: At [facility], you and your health needs are our priority.  As part of our continuing mission to provide you with exceptional heart care, we have created designated Provider Care Teams.  These Care Teams include your primary Cardiologist (physician) and Advanced Practice Providers (APPs -  Physician Assistants and Nurse Practitioners) who all work together to provide you with the care you need, when you need it.  We recommend signing up for the patient portal called "MyChart".  Sign up information is provided on this After Visit Summary.  MyChart is used to connect with patients for Virtual Visits (Telemedicine).  Patients are able to view lab/test results, encounter notes, upcoming appointments, etc.  Non-urgent messages can be sent to your provider as well.   To learn more about what you can do with MyChart, go to [email or web address].    Your next appointment:   6 month(s)  The format for your next appointment:   In Person  Provider:   You may see Nelva Bush, MD or one of the following Advanced Practice Providers on your designated Care Team:    Murray Hodgkins, NP  Christell Faith, PA-C  Marrianne Mood, PA-C  Cadence Kathlen Mody, [place]  Laurann Montana, NP  Other Instructions  Heart Healthy Diet Recommendations: A low-salt diet is recommended. Meats should be grilled, baked, or boiled. Avoid fried foods. Focus on lean protein sources like fish or chicken with vegetables and fruits. The American Heart Association is a Microbiologist!  American Heart Association Diet and Lifeystyle Recommendations    Exercise recommendations: The American Heart Association recommends 150 minutes of moderate intensity exercise weekly. Try 30 minutes of moderate intensity exercise 4-5 times per week. This could include walking, jogging, or swimming.

## 2020-06-18 NOTE — Progress Notes (Signed)
Office Visit    Patient Name: Andrea Savage Date of Encounter: 06/18/2020  PCP:  [name], [place]  Cardiologist:  Nelva Bush, MD  Advanced Practice Provider:  No care team member to display Electrophysiologist:  None   Chief Complaint    Andrea Savage is a 75 y.o. female with a hx of HLD, HFpEF, palpitations/PSVT, recurrent syncope/near syncope, possible asthma, fibromyalgia, depression, postherpetic neuralgia  presents today for follow up of PSVT.    Past Medical History    Past Medical History:  Diagnosis Date  . Asthma   . Depression   . Fibromyalgia   . GERD (gastroesophageal reflux disease)   . H/O scarlet fever    as child  . Heart murmur    s/p scarlet fever as child  . IBS (irritable bowel syndrome)   . Low bone density   . Osteopenia   . Reflux   . Rosacea   . Wears hearing aid in both ears    Past Surgical History:  Procedure Laterality Date  . ABDOMINAL HYSTERECTOMY  2009   Total  . BREAST EXCISIONAL BIOPSY Left 1979   NEG  . COLONOSCOPY  [id number]   repeat 10 years  . COLONOSCOPY WITH PROPOFOL N/A 03/24/2020   Procedure: COLONOSCOPY WITH PROPOFOL;  Surgeon: Lesly Rubenstein, MD;  Location: [facility] ENDOSCOPY;  Service: Endoscopy;  Laterality: N/A;  . ESOPHAGOGASTRODUODENOSCOPY (EGD) WITH PROPOFOL N/A 03/24/2020   Procedure: ESOPHAGOGASTRODUODENOSCOPY (EGD) WITH PROPOFOL;  Surgeon: Lesly Rubenstein, MD;  Location: [facility] ENDOSCOPY;  Service: Endoscopy;  Laterality: N/A;  . EXCISION MORTON'S NEUROMA Left 09/21/2017   Procedure: EXCISION MORTON'S NEUROMA;  Surgeon: Albertine Patricia, DPM;  Location: [place];  Service: Podiatry;  Laterality: Left;  . FOOT SURGERY Right 1990  . HAND SURGERY Right   . LAPAROSCOPIC OOPHERECTOMY    . MINOR HARDWARE REMOVAL Left 11/22/2018   Procedure: REMOVAL SCREW;DEEP 2nd and 3RD LEFT CAPSULE RELEASE OF THIRD METATARSAL PHALANGEAL JOINT LEFT, TENDON LENGTHING  THIRD TOE LEFT AND CORTISONE INJECTION LEFT;  Surgeon: Albertine Patricia, DPM;  Location: [place];  Service: Podiatry;  Laterality: Left;  LMA OR IVA LOCAL  . TONSILLECTOMY    . TONSILLECTOMY    . WEIL OSTEOTOMY Left 09/21/2017   Procedure: WEIL OSTEOTOMY-2ND & 3RD TOES;  Surgeon: Albertine Patricia, DPM;  Location: [place];  Service: Podiatry;  Laterality: Left;  LMA WITH LOCAL MINI MONSTER DR TROXLER WANTS TED HOSE ON PATIENT'S RIGHT LEG    Allergies  Allergies  Allergen Reactions  . Compazine [Prochlorperazine] Other (See Comments)    Grand mal seizure  . Dust Mite Extract     History of Present Illness    Andrea Savage is a 75 y.o. female with a hx of PSVT, HLD, HFpEF, palpitations/PSVT, recurrent syncope/near syncope, possible asthma, fibromyalgia, depression, postherpetic neuralgia last seen 12/16/19 by Dr. Saunders Revel.  Echo 09/2017 with normal LVEF and mild MR. Lexiscan myoview 09/2017 low risk study with no evidence of ischemia . Long term monitor 10/2017 with rare PAC/PVC and several episodes of PSVT. She was subsequently started on Metoprolol with improvement.    Echo 06/2019 with LVEF 60-65%, gr2DD, mild to moderate MR, mildly elevated PASP, and RA pressure 22mmHg. She was started on Lasix 20mg  daily. Lower extremity ABI ordered due to RLE weakness with results 06/27/19 with bilateral LE ABI normal.    She has previously been on Crestor 40mg  daily with reported myalgias, this  was stopped. She was initially on Zetia though transitioned to [place] for optimal lipid control.  However this was cost prohibitive did not resume Zetia.  Denies chest pain, pressure, tightness.  Does endorse lots of recent stressors that are repeating her heart health, she is working on multiple crochets products for grandchildren.  She also found out this week there is not much more they can do for her chronic cough and hearing. She has been dismissed from PT as they they feel they are  causing more symptoms rather than helping.  Endorses bilateral ankle edema. She does sit with her feet up as much as possible. Endorses eating a low salt diet. She is taking her Furosemide as needed about three or four times per week.  This helps with her edema.  Endorses low-salt diet.  Exercise is somewhat limited by orthopedic issues.  Reports very rare palpitations that are fleeting and not bothersome.  No lightheadedness, dizziness, near-syncope, syncope.  Reports no shortness of breath at rest and stable mild dyspnea on exertion.  No orthopnea, PND.  EKGs/Labs/Other Studies Reviewed:   The following studies were reviewed today:  Echo 06/21/19  1. Left ventricular ejection fraction, by estimation, is 60 to 65%. The  left ventricle has normal function. The left ventricle has no regional  wall motion abnormalities. Left ventricular diastolic parameters are  consistent with Grade II diastolic  dysfunction (pseudonormalization).   2. Right ventricular systolic function is normal. The right ventricular  size is normal. There is mildly elevated pulmonary artery systolic  pressure.   3. The mitral valve is normal in structure. Mild to moderate mitral valve  regurgitation.   4. The inferior vena cava is dilated in size with <50% respiratory  variability, suggesting right atrial pressure of 15 mmHg.    LE Arterial study 06/27/2019 Summary:  Right: Resting right ankle-brachial index is within normal range.  No evidence of significant right lower extremity arterial disease. The  right toe-brachial index is normal.  Left: Resting left ankle-brachial index is within normal range.  No evidence of significant left lower extremity arterial disease. The left  toe-brachial index is normal.    Zio 10/2017  The patient was monitored for 12 days, 19 hours.  The predominant rhythm was sinus with an average rate of 76 bpm (range 53-135 bpm in sinus).  There were rare PAC's and PVC's.  33 atrial  runs were observed, with a maximal rate of 203 bpm. The longest run lasted 17.3 seconds.  No sustained arrhythmia or prolonged pause was identified.  Patient triggered events correspond to sinus rhythm, PAC's, and atrial runs. Predominantly sinus rhythm with rare PAC's and PVC's.  Multiple atrial runs lasting up to 17 seconds were noted, consistent with paroxysmal supraventricular tachycardia.   09/2017 Lexiscan  No T wave inversion was noted during stress.  The study is normal.  This is a low risk study.  The left ventricular ejection fraction is hyperdynamic (>65%).   EKG:  No EKG is ordered today.   Recent Labs: 06/20/2019: Hemoglobin 13.7; Platelets 188 07/01/2019: BUN 18; Creatinine, Ser 0.83; Potassium 4.3; Sodium 139 07/08/2019: TSH 1.840 09/30/2019: ALT 17  Recent Lipid Panel    Component Value Date/Time   CHOL 219 (H) 09/30/2019 0937   CHOL 228 (H) 06/20/2019 1053   TRIG 75 09/30/2019 0937   HDL 76 09/30/2019 0937   HDL 59 06/20/2019 1053   CHOLHDL 2.9 09/30/2019 0937   VLDL 15 09/30/2019 0937   LDLCALC 128 (H) 09/30/2019  9476   LDLCALC 142 (H) 06/20/2019 1053   LDLDIRECT 149 (H) 06/20/2019 1053   Home Medications   Current Meds  Medication Sig  . albuterol (VENTOLIN HFA) 108 (90 Base) MCG/ACT inhaler Inhale 2 puffs into the lungs every 6 (six) hours as needed for wheezing or shortness of breath.  Marland Kitchen aspirin EC 81 MG tablet Take 1 tablet (81 mg total) by mouth daily.  . busPIRone (BUSPAR) 5 MG tablet Take 1 tablet (5 mg total) by mouth daily.  Marland Kitchen CALCIUM-MAGNESIUM-ZINC PO Take by mouth daily.  . cetirizine (ZYRTEC) 10 MG tablet Take 10 mg by mouth daily.  . citalopram (CELEXA) 40 MG tablet Take 1 tablet by mouth daily.  . COLLAGEN PO Take by mouth. Takes 2-3 times per week in shakes  . furosemide (LASIX) 20 MG tablet Take 1 tablet (20 mg total) by mouth daily.  Marland Kitchen gabapentin (NEURONTIN) 600 MG tablet Take 1 capsule by mouth at bedtime.  . metoprolol succinate  (TOPROL-XL) 25 MG 24 hr tablet Take 0.5 tablets (12.5 mg total) by mouth daily.  . naproxen sodium (ALEVE) 220 MG tablet Take 440 mg by mouth at bedtime.   Marland Kitchen omeprazole (PRILOSEC) 40 MG capsule Take 40 mg by mouth daily.   Marland Kitchen Specialty Vitamins Products (MAGNESIUM, AMINO ACID CHELATE,) 133 MG tablet Take 2-3 tablets by mouth as directed. Takes most evenings for leg pain     Review of Systems  All other systems reviewed and are otherwise negative except as noted above.  Physical Exam    VS:  BP 124/74 (BP Location: Left Arm, Patient Position: Sitting, Cuff Size: Normal)   Pulse 78   Ht 5\' 4"  (1.626 m)   Wt 166 lb (75.3 kg)   SpO2 98%   BMI 28.49 kg/m  , BMI Body mass index is 28.49 kg/m.  Wt Readings from Last 3 Encounters:  06/18/20 166 lb (75.3 kg)  03/24/20 160 lb (72.6 kg)  12/16/19 159 lb (72.1 kg)    GEN: Well nourished, well developed, in no acute distress. HEENT: normal. Neck: Supple, no JVD, carotid bruits, or masses. Cardiac: RRR, no murmurs, rubs, or gallops. No clubbing, cyanosis, edema.  Radials/DP/PT 2+ and equal bilaterally.  Respiratory:  Respirations regular and unlabored, clear to auscultation bilaterally. GI: Soft, nontender, nondistended. MS: No deformity or atrophy. Skin: Warm and dry, no rash. Neuro:  Strength and sensation are intact. Psych: Normal affect.  Assessment & Plan    1. PSVT - No recurrent palpitations. Continue Toprol 12.5mg  daily.   2. HLD - Previous intolerance to statin including Crestor. Nexlizet was cost prohibitive. Start Zetia 10mg  daily.   3. Chronic HFpEF / LE edema- Echo 06/2019 EF 60-65%, gr2DD, RVSF normal, mildly elevated PASP, RA pressure 3mmHg. Euvolemic on exam. Continue Toprol 12.5mg  daily. She will continue Lasix as needed for edema - taking 3 or 4 times per week.  Low-salt diet and regular cardiovascular exercise encouraged.  Disposition: Follow up in 6 month(s) with Dr. Saunders Revel or APP   Signed, Loel Dubonnet,  NP 06/18/2020, 9:45 AM [place]

## 2020-06-24 DIAGNOSIS — J301 Allergic rhinitis due to pollen: Secondary | ICD-10-CM | POA: Diagnosis not present

## 2020-07-01 DIAGNOSIS — J301 Allergic rhinitis due to pollen: Secondary | ICD-10-CM | POA: Diagnosis not present

## 2020-07-03 DIAGNOSIS — M1711 Unilateral primary osteoarthritis, right knee: Secondary | ICD-10-CM | POA: Diagnosis not present

## 2020-07-03 DIAGNOSIS — M7051 Other bursitis of knee, right knee: Secondary | ICD-10-CM | POA: Diagnosis not present

## 2020-07-03 DIAGNOSIS — M25561 Pain in right knee: Secondary | ICD-10-CM | POA: Diagnosis not present

## 2020-07-08 ENCOUNTER — Other Ambulatory Visit: Payer: Self-pay | Admitting: Student

## 2020-07-08 DIAGNOSIS — M25561 Pain in right knee: Secondary | ICD-10-CM

## 2020-07-08 DIAGNOSIS — M1711 Unilateral primary osteoarthritis, right knee: Secondary | ICD-10-CM

## 2020-07-15 DIAGNOSIS — J301 Allergic rhinitis due to pollen: Secondary | ICD-10-CM | POA: Diagnosis not present

## 2020-07-20 ENCOUNTER — Other Ambulatory Visit: Payer: Self-pay

## 2020-07-20 ENCOUNTER — Ambulatory Visit
Admission: RE | Admit: 2020-07-20 | Discharge: 2020-07-20 | Disposition: A | Discharge status: Home / Self Care | Payer: PPO | Source: Ambulatory Visit | Attending: Student | Admitting: Student

## 2020-07-20 DIAGNOSIS — M25561 Pain in right knee: Secondary | ICD-10-CM | POA: Diagnosis not present

## 2020-07-20 DIAGNOSIS — M1711 Unilateral primary osteoarthritis, right knee: Secondary | ICD-10-CM | POA: Diagnosis not present

## 2020-07-20 DIAGNOSIS — M25562 Pain in left knee: Secondary | ICD-10-CM | POA: Diagnosis not present

## 2020-07-22 DIAGNOSIS — J301 Allergic rhinitis due to pollen: Secondary | ICD-10-CM | POA: Diagnosis not present

## 2020-07-29 DIAGNOSIS — J301 Allergic rhinitis due to pollen: Secondary | ICD-10-CM | POA: Diagnosis not present

## 2020-07-31 DIAGNOSIS — H43811 Vitreous degeneration, right eye: Secondary | ICD-10-CM | POA: Diagnosis not present

## 2020-07-31 DIAGNOSIS — H26493 Other secondary cataract, bilateral: Secondary | ICD-10-CM | POA: Diagnosis not present

## 2020-08-05 DIAGNOSIS — J301 Allergic rhinitis due to pollen: Secondary | ICD-10-CM | POA: Diagnosis not present

## 2020-08-11 DIAGNOSIS — E559 Vitamin D deficiency, unspecified: Secondary | ICD-10-CM | POA: Diagnosis not present

## 2020-08-11 DIAGNOSIS — F320 Major depressive disorder, single episode, mild: Secondary | ICD-10-CM | POA: Diagnosis not present

## 2020-08-11 DIAGNOSIS — E782 Mixed hyperlipidemia: Secondary | ICD-10-CM | POA: Diagnosis not present

## 2020-08-11 DIAGNOSIS — Z0000 Encounter for general adult medical examination without abnormal findings: Secondary | ICD-10-CM | POA: Diagnosis not present

## 2020-08-11 DIAGNOSIS — K219 Gastro-esophageal reflux disease without esophagitis: Secondary | ICD-10-CM | POA: Diagnosis not present

## 2020-08-12 DIAGNOSIS — J301 Allergic rhinitis due to pollen: Secondary | ICD-10-CM | POA: Diagnosis not present

## 2020-08-19 DIAGNOSIS — J301 Allergic rhinitis due to pollen: Secondary | ICD-10-CM | POA: Diagnosis not present

## 2020-08-20 DIAGNOSIS — H26493 Other secondary cataract, bilateral: Secondary | ICD-10-CM | POA: Diagnosis not present

## 2020-08-26 DIAGNOSIS — J301 Allergic rhinitis due to pollen: Secondary | ICD-10-CM | POA: Diagnosis not present

## 2020-09-02 DIAGNOSIS — J301 Allergic rhinitis due to pollen: Secondary | ICD-10-CM | POA: Diagnosis not present

## 2020-09-09 DIAGNOSIS — J301 Allergic rhinitis due to pollen: Secondary | ICD-10-CM | POA: Diagnosis not present

## 2020-09-14 ENCOUNTER — Ambulatory Visit: Payer: PPO

## 2020-09-16 DIAGNOSIS — E559 Vitamin D deficiency, unspecified: Secondary | ICD-10-CM | POA: Diagnosis not present

## 2020-09-16 DIAGNOSIS — R053 Chronic cough: Secondary | ICD-10-CM | POA: Diagnosis not present

## 2020-09-16 DIAGNOSIS — E782 Mixed hyperlipidemia: Secondary | ICD-10-CM | POA: Diagnosis not present

## 2020-09-16 DIAGNOSIS — J301 Allergic rhinitis due to pollen: Secondary | ICD-10-CM | POA: Diagnosis not present

## 2020-09-16 DIAGNOSIS — Z0000 Encounter for general adult medical examination without abnormal findings: Secondary | ICD-10-CM | POA: Diagnosis not present

## 2020-09-16 DIAGNOSIS — Z79899 Other long term (current) drug therapy: Secondary | ICD-10-CM | POA: Diagnosis not present

## 2020-09-18 DIAGNOSIS — J301 Allergic rhinitis due to pollen: Secondary | ICD-10-CM | POA: Diagnosis not present

## 2020-09-22 DIAGNOSIS — F320 Major depressive disorder, single episode, mild: Secondary | ICD-10-CM | POA: Diagnosis not present

## 2020-09-22 DIAGNOSIS — M797 Fibromyalgia: Secondary | ICD-10-CM | POA: Diagnosis not present

## 2020-09-22 DIAGNOSIS — K219 Gastro-esophageal reflux disease without esophagitis: Secondary | ICD-10-CM | POA: Diagnosis not present

## 2020-09-22 DIAGNOSIS — F411 Generalized anxiety disorder: Secondary | ICD-10-CM | POA: Diagnosis not present

## 2020-09-29 DIAGNOSIS — M2391 Unspecified internal derangement of right knee: Secondary | ICD-10-CM | POA: Diagnosis not present

## 2020-09-29 DIAGNOSIS — I739 Peripheral vascular disease, unspecified: Secondary | ICD-10-CM | POA: Diagnosis not present

## 2020-09-29 DIAGNOSIS — M1711 Unilateral primary osteoarthritis, right knee: Secondary | ICD-10-CM | POA: Diagnosis not present

## 2020-09-30 DIAGNOSIS — J301 Allergic rhinitis due to pollen: Secondary | ICD-10-CM | POA: Diagnosis not present

## 2020-10-04 DIAGNOSIS — I739 Peripheral vascular disease, unspecified: Secondary | ICD-10-CM | POA: Insufficient documentation

## 2020-10-07 DIAGNOSIS — J301 Allergic rhinitis due to pollen: Secondary | ICD-10-CM | POA: Diagnosis not present

## 2020-10-09 NOTE — Discharge Instructions (Addendum)
Instructions after Knee Arthroscopy    James P. Hooten, Jr., M.D.     Dept. of Orthopaedics & Sports Medicine  [facility]  [address]  [place], [place]  [address]   Phone: [phone]   Fax: [phone]   DIET: Drink plenty of non-alcoholic fluids & begin a light diet. Resume your normal diet the day after surgery.  ACTIVITY:  You may use crutches or a walker with weight-bearing as tolerated, unless instructed otherwise. You may wean yourself off of the walker or crutches as tolerated.  Begin doing gentle exercises. Exercising will reduce the pain and swelling, increase motion, and prevent muscle weakness.   Avoid strenuous activities or athletics for a minimum of 4-6 weeks after arthroscopic surgery. Do not drive or operate any equipment until instructed.  WOUND CARE:  Place one to two pillows under the knee the first day or two when sitting or lying.  Continue to use the ice packs periodically to reduce pain and swelling. The small incisions in your knee are closed with nylon stitches. The stitches will be removed in the office. The bulky dressing may be removed on the second day after surgery. DO NOT TOUCH THE STITCHES. Put a Band-Aid over each stitch. Do NOT use any ointments or creams on the incisions.  You may bathe or shower after the stitches are removed at the first office visit following surgery.  MEDICATIONS: You may resume your regular medications. Please take the pain medication as prescribed. Do not take pain medication on an empty stomach. Do not drive or drink alcoholic beverages when taking pain medications.  CALL THE OFFICE FOR: Temperature above 101 degrees Excessive bleeding or drainage on the dressing. Excessive swelling, coldness, or paleness of the toes. Persistent nausea and vomiting.  FOLLOW-UP:  You should have an appointment to return to the office in 7-10 days after surgery.      [facility] Department Directory          [email or web address]       [email or web address]          Cardiology  Appointments: [place] - [phone] [place] - [phone]  Endocrinology  Appointments: [place] - [phone] [place] - [phone]  Gastroenterology  Appointments: [place] - [phone] [place] - [phone]        General Surgery   Appointments: [place] - [phone]  Internal Medicine/Family Medicine  Appointments: [place] - [phone] [place] - [phone] [place] - [phone]  Metabolic and Weigh Loss Surgery  Appointments: [place] - [phone]        Neurology  Appointments: [place] - [phone] [place] - [phone]  Neurosurgery  Appointments: [place] - [phone]  Obstetrics & Gynecology  Appointments: [place] - [phone] [place] - [phone]        Pediatrics  Appointments: [place] - [phone] [place] - [phone]  Physiatry  Appointments: [place] -[phone]  Physical Therapy  Appointments: [place] - [phone] [place] - [phone]        Podiatry  Appointments: [place] - [phone] [place] - [phone]  Pulmonology  Appointments: [place] - [phone]  Rheumatology  Appointments:  - [phone]         Location: [facility]  [address] , [place]  [address]  [place] Location: [facility] [address] [place], [place]  [address]  [place] Location: [facility] [address] [place], [place]  [address]      AMBULATORY SURGERY  DISCHARGE INSTRUCTIONS   The drugs that you were given will stay in your system until tomorrow so for the next 24   hours you should not:  Drive an automobile Make any legal decisions Drink any alcoholic beverage   You may resume regular meals tomorrow.  Today it is better to start with liquids and gradually work up to solid foods.  You may eat anything you prefer, but it is better to  start with liquids, then soup and crackers, and gradually work up to solid foods.   Please notify your doctor immediately if you have any unusual bleeding, trouble breathing, redness and pain at the surgery site, drainage, fever, or pain not relieved by medication.    Additional Instructions:   Please contact your physician with any problems or Same Day Surgery at [phone], Monday through Friday 6 am to 4 pm, or [place] [facility] [place] [facility] number at [phone]. 
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
## 2020-10-12 ENCOUNTER — Other Ambulatory Visit: Payer: Self-pay

## 2020-10-12 ENCOUNTER — Encounter: Payer: Self-pay | Admitting: Orthopedic Surgery

## 2020-10-12 ENCOUNTER — Telehealth: Payer: Self-pay

## 2020-10-12 ENCOUNTER — Other Ambulatory Visit
Admission: RE | Admit: 2020-10-12 | Discharge: 2020-10-12 | Disposition: A | Discharge status: Home / Self Care | Payer: PPO | Source: Ambulatory Visit | Attending: Orthopedic Surgery | Admitting: Orthopedic Surgery

## 2020-10-12 NOTE — Progress Notes (Signed)
Perioperative Services  Pre-Admission/Anesthesia Testing Clinical Review  Date: 10/13/20  Patient Demographics:  Name: Andrea Savage DOB:   1945/11/22 MRN:   [id number]  Planned Surgical Procedure(s):   Case: [id number] Date/Time: 10/14/20 1539  Procedure: ARTHROSCOPY KNEE (Right: Knee)  Anesthesia type: Choice  Pre-op diagnosis: INTERNAL DERANGEMENT OF RIGHT KNEE.  Location: [facility] OR ROOM 01 / [facility]  Surgeons: Andrea Leep, MD   NOTE: Available PAT nursing documentation and vital signs have been reviewed. Clinical nursing staff has updated patient's PMH/PSHx, current medication list, and drug allergies/intolerances to ensure comprehensive history available to assist in medical decision making as it pertains to the aforementioned surgical procedure and anticipated anesthetic course. Extensive review of available clinical information performed. [facility] and PSHx updated with any diagnoses/procedures that  may have been inadvertently omitted during her intake with the pre-admission testing department's nursing staff.  Clinical Discussion:  Andrea Savage is a 75 y.o. female who is submitted for pre-surgical anesthesia review and clearance prior to her undergoing the above procedure. Patient has never been a smoker. Pertinent PMH includes: HFpEF (G2DD), PSVT, palpitations, cardiac murmur, recurrent syncope, MV regurgitation, HTN, HLD, GERD (on daily PPI), OA, fibromyalgia, depression, anxiety.    Patient is followed by cardiology (End, MD). She was last seen in the cardiology clinic on 06/18/2020; notes reviewed.  At the time of her clinic visit, patient doing well overall from a cardiovascular perspective.  She denied any episodes of chest pain.  She complained of chronic exertional dyspnea that was mild in nature.  No PND, orthopnea, vertiginous symptoms, or presyncope/syncope.  Patient with intermittent palpitations and lower extremity edema.  PMH  significant for cardiovascular diagnoses.  TTE performed on 09/14/2017 revealed normal left ventricular systolic function with an EF of 60-65%.  Doppler parameters consistent with abnormal relaxation (G1DD).  There was mild mitral valve regurgitation.  Myocardial perfusion imaging study performed on 09/18/2017 revealed a hyperdynamic LVEF of greater than 65%.  There was no evidence of stress-induced myocardial ischemia or arrhythmia.  Normal low risk study.  Long-term cardiac event monitor study performed on 10/06/2017 revealed a predominantly underlying sinus rhythm with an average heart rate 76 bpm; range 53-135 bpm.  There was rare atrial and ventricular ectopy.  33 atrial runs were observed with a maximum heart rate of 203 bpm with the longest lasting 17.3 seconds.  There was no sustained arrhythmia or prolonged pauses.  Repeat TTE performed on 06/21/2019 revealed normal left ventricular systolic function with an EF of 60-65%.  Diastolic parameters consistent with G2DD.  There was mild to moderate mitral valve regurgitation.  PASP mildly elevated (see full interpretation of cardiovascular testing below).  Blood pressure well controlled at 124/74 on currently prescribed diuretic and beta-blocker therapies.  Patient previously noted to be statin intolerant. Nexlizet was cost prohibitive for the patient.  Decision was made to start patient on ezetimibe for management of her HLD. Patient is not diabetic.  Functional capacity limited by orthopedic pain, however patient still felt to be able to achieve 4 METS of activity.  No other changes were made to patient's medication regimen.  Patient to follow-up with outpatient cardiology in 6 months or sooner if needed.   Patient is scheduled for an elective knee arthroscopy on 10/14/2020 with Dr. Skip Estimable, MD.  Given patient's past medical history significant for cardiovascular diagnoses, presurgical cardiac clearance was sought by the PAT team. Per  cardiology, "based on patient's past medical history and time  since his last clinic visit, patient would be at an overall ACCEPTABLE risk for the planned procedure without further cardiovascular testing or intervention at this time".This patient is on daily antiplatelet therapy. Patient cleared by cardiology to hold her ASA as deemed necessary by her orthopedic surgeon.  Of note, when cardiology spoke with her via phone today, it was discovered that patient has not been taking her ASA as recommended.  Her last dose was "several months ago".   Patient denies previous perioperative complications with anesthesia in the past. In review of the available records, it is noted that patient underwent a general anesthetic course here (ASA II) in 03/2020 without documented complications.   Vitals with BMI 10/12/2020 06/18/2020 03/24/2020  Height 5' 4.5" 5' 4" -  Weight 156 lbs 166 lbs -  BMI 24.40 10.27 -  Systolic - 253 664  Diastolic - 74 68  Pulse - 78 82    Providers/Specialists:   NOTE: Primary physician provider listed below. Patient may have been seen by APP or partner within same practice.   PROVIDER ROLE / SPECIALTY LAST OV  Hooten, Andrea Record, MD Orthopedics (Surgeon) 09/29/2020  Andrea Lighter, MD Primary Care Provider 09/22/2020  End, Andrea Gave, MD Cardiology 06/18/2020   Allergies:  Compazine [prochlorperazine] and Dust mite extract  Current Home Medications:   No current facility-administered medications for this encounter.    albuterol (VENTOLIN HFA) 108 (90 Base) MCG/ACT inhaler   aspirin EC 81 MG tablet   busPIRone (BUSPAR) 5 MG tablet   Calcium-Magnesium-Vitamin D (CALCIUM MAGNESIUM PO)   citalopram (CELEXA) 20 MG tablet   COLLAGEN PO   EPINEPHrine 0.3 mg/0.3 mL IJ SOAJ injection   ezetimibe (ZETIA) 10 MG tablet   furosemide (LASIX) 20 MG tablet   loratadine (CLARITIN) 10 MG tablet   metoprolol succinate (TOPROL-XL) 25 MG 24 hr tablet   naproxen sodium (ALEVE) 220 MG  tablet   omeprazole (PRILOSEC) 40 MG capsule   OVER THE COUNTER MEDICATION   OVER THE COUNTER MEDICATION   OVER THE COUNTER MEDICATION   OVER THE COUNTER MEDICATION   citalopram (CELEXA) 40 MG tablet   History:   Past Medical History:  Diagnosis Date   (HFpEF) heart failure with preserved ejection fraction (HCC)    Anxiety    Asthma    Depression    Fibromyalgia    GERD (gastroesophageal reflux disease)    Grade II diastolic dysfunction    H/O scarlet fever    as child   Heart murmur    s/p scarlet fever as child   HLD (hyperlipidemia)    HTN (hypertension)    IBS (irritable bowel syndrome)    Low bone density    Mitral regurgitation    OA (osteoarthritis)    Osteopenia    Palpitations    PHN (postherpetic neuralgia)    PSVT (paroxysmal supraventricular tachycardia) (HCC)    Recurrent syncope    Rosacea    Seizure ([place]) 1978   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   Wears hearing aid in both ears    Past Surgical History:  Procedure Laterality Date   ABDOMINAL HYSTERECTOMY  2009   Total   BREAST EXCISIONAL BIOPSY Left 1979   NEG   COLONOSCOPY  [id number]   repeat 10 years   COLONOSCOPY WITH PROPOFOL N/A 03/24/2020   Procedure: COLONOSCOPY WITH PROPOFOL;  Surgeon: Lesly Rubenstein, MD;  Location: [facility] ENDOSCOPY;  Service: Endoscopy;  Laterality: N/A;   ESOPHAGOGASTRODUODENOSCOPY (EGD) WITH PROPOFOL N/A 03/24/2020  Procedure: ESOPHAGOGASTRODUODENOSCOPY (EGD) WITH PROPOFOL;  Surgeon: Lesly Rubenstein, MD;  Location: [facility] ENDOSCOPY;  Service: Endoscopy;  Laterality: N/A;   EXCISION MORTON'S NEUROMA Left 09/21/2017   Procedure: EXCISION MORTON'S NEUROMA;  Surgeon: Albertine Patricia, DPM;  Location: [place];  Service: Podiatry;  Laterality: Left;   FOOT SURGERY Right 1990   HAND SURGERY Right    LAPAROSCOPIC OOPHERECTOMY     MINOR HARDWARE REMOVAL Left 11/22/2018   Procedure: REMOVAL SCREW;DEEP 2nd and 3RD LEFT CAPSULE RELEASE OF THIRD METATARSAL PHALANGEAL  JOINT LEFT, TENDON LENGTHING THIRD TOE LEFT AND CORTISONE INJECTION LEFT;  Surgeon: Albertine Patricia, DPM;  Location: [place];  Service: Podiatry;  Laterality: Left;  LMA OR IVA LOCAL   TONSILLECTOMY     TONSILLECTOMY     WEIL OSTEOTOMY Left 09/21/2017   Procedure: WEIL OSTEOTOMY-2ND & 3RD TOES;  Surgeon: Albertine Patricia, DPM;  Location: [place];  Service: Podiatry;  Laterality: Left;  LMA WITH LOCAL MINI MONSTER DR TROXLER WANTS TED HOSE ON PATIENT'S RIGHT LEG   Family History  Problem Relation Age of Onset   Cancer Mother        breast   Breast cancer Mother 57   Cancer Father        pancreatic   Asthma Father    Heart disease Father    Liver disease Sister    Diabetes Sister    Thyroid disease Sister    Thyroid disease Brother    Kidney disease Brother    Social History   Tobacco Use   Smoking status: Never   Smokeless tobacco: Never  Vaping Use   Vaping Use: Never used  Substance Use Topics   Alcohol use: Yes    Alcohol/week: 1.0 standard drink    Types: 1 Glasses of wine per week    Comment: on occasion   Drug use: No    Pertinent Clinical Results:  LABS: Labs reviewed: Acceptable for surgery.   Ref Range & Units 3 wk ago  WBC (White Blood Cell Count) 4.1 - 10.2 10^3/uL 4.2   RBC (Red Blood Cell Count) 4.04 - 5.48 10^6/uL 4.22   Hemoglobin 12.0 - 15.0 gm/dL 14.0   Hematocrit 35.0 - 47.0 % 39.7   MCV (Mean Corpuscular Volume) 80.0 - 100.0 fl 94.1   MCH (Mean Corpuscular Hemoglobin) 27.0 - 31.2 pg 33.2 High    MCHC (Mean Corpuscular Hemoglobin Concentration) 32.0 - 36.0 gm/dL 35.3   Platelet Count 150 - 450 10^3/uL 205   RDW-CV (Red Cell Distribution Width) 11.6 - 14.8 % 12.5   MPV (Mean Platelet Volume) 9.4 - 12.4 fl 9.5   Neutrophils 1.50 - 7.80 10^3/uL 2.05   Lymphocytes 1.00 - 3.60 10^3/uL 1.61   Monocytes 0.00 - 1.50 10^3/uL 0.42   Eosinophils 0.00 - 0.55 10^3/uL 0.09   Basophils 0.00 - 0.09 10^3/uL 0.03   Neutrophil % 32.0 -  70.0 % 48.8   Lymphocyte % 10.0 - 50.0 % 38.2   Monocyte % 4.0 - 13.0 % 10.0   Eosinophil % 1.0 - 5.0 % 2.1   Basophil% 0.0 - 2.0 % 0.7   Immature Granulocyte % <=0.7 % 0.2   Immature Granulocyte Count <=0.06 10^3/L 0.01   Resulting Agency  [facility]  Specimen Collected: 09/16/20 09:34 Last Resulted: 09/16/20 10:46  Received From: [facility]  Result Received: 10/08/20 10:05    Ref Range & Units 3 wk ago  Glucose 70 - 110 mg/dL 85  Sodium 136 - 145 mmol/L 137   Potassium 3.6 - 5.1 mmol/L 4.3   Chloride 97 - 109 mmol/L 101   Carbon Dioxide (CO2) 22.0 - 32.0 mmol/L 31.2   Urea Nitrogen (BUN) 7 - 25 mg/dL 10   Creatinine 0.6 - 1.1 mg/dL 0.7   Glomerular Filtration Rate (eGFR), MDRD Estimate >60 mL/min/1.73sq m 82   Calcium 8.7 - 10.3 mg/dL 9.3   AST  8 - 39 U/L 15   ALT  5 - 38 U/L 10   Alk Phos (alkaline Phosphatase) 34 - 104 U/L 63   Albumin 3.5 - 4.8 g/dL 4.1   Bilirubin, Total 0.3 - 1.2 mg/dL 0.6   Protein, Total 6.1 - 7.9 g/dL 6.4   A/G Ratio 1.0 - 5.0 gm/dL 1.8   Resulting Agency  [facility]  Specimen Collected: 09/16/20 09:34 Last Resulted: 09/16/20 12:23  Received From: [facility]  Result Received: 10/08/20 10:05    ECG: Date: 10/13/2020 Time ECG obtained: 0905 AM Rate: 70 bpm Rhythm: normal sinus Axis (leads I and aVF): Normal Intervals: PR 152 ms. QRS 98 ms. QTc 438 ms. ST segment and T wave changes: No evidence of acute ST segment elevation or depression Comparison: Similar to previous tracing obtained on 12/16/2019   IMAGING / PROCEDURES: TRANSTHORACIC ECHOCARDIOGRAM performed on 06/21/2019 Left ventricular ejection fraction, by estimation, is 60 to 65%. The left ventricle has normal function. The left ventricle has no regional wall motion abnormalities. Left ventricular diastolic parameters are consistent with Grade II diastolic dysfunction (pseudonormalization).  Right ventricular  systolic function is normal. The right ventricular size is normal. There is mildly elevated pulmonary artery systolic  pressure.  The mitral valve is normal in structure. Mild to moderate mitral valve regurgitation.  The inferior vena cava is dilated in size with <50% respiratory variability, suggesting right atrial pressure of 15 mmHg.   LONG TERM CARDIAC EVENT MONITOR STUDY performed on 10/06/2017 Predominantly underlying sinus rhythm Average heart rate 76 bpm Heart rate ranged from 53-135 bpm in sinus Rare atrial and ventricular ectopy 33 atrial runs were observed with maximum heart rate of 203 bpm; longest lasting 17.3 seconds No sustained arrhythmias or prolonged pauses Patient triggered events correspond to sinus rhythm, PACs, and atrial runs  LEXISCAN performed on 09/18/2017 Hyperdynamic LVEF of >65% No stress-induced myocardial ischemia or arrhythmia Normal low risk study  TRANSTHORACIC ECHOCARDIOGRAM performed on 09/14/2017 Normal left ventricular systolic function with an EF of 60-65% No regional wall motion abnormalities Doppler parameters consistent with left ventricular relaxation (G1DD) Mild mitral valve regurgitation PASP within normal range  Impression and Plan:  Andrea Savage has been referred for pre-anesthesia review and clearance prior to her undergoing the planned anesthetic and procedural courses. Available labs, pertinent testing, and imaging results were personally reviewed by me. This patient has been appropriately cleared by cardiology with an overall ACCEPTABLE risk of significant perioperative cardiovascular complications.  Based on clinical review performed today (10/13/20), barring any significant acute changes in the patient's overall condition, it is anticipated that she will be able to proceed with the planned surgical intervention. Any acute changes in clinical condition may necessitate her procedure being postponed and/or cancelled. Patient will  meet with anesthesia team (MD and/or CRNA) on the day of her procedure for preoperative evaluation/assessment. Questions regarding anesthetic course will be fielded at that time.   Pre-surgical instructions were reviewed with the patient during her PAT appointment and questions were fielded by PAT clinical staff.  Patient was advised that if any questions or concerns arise prior to her procedure then she should return a call to PAT and/or her surgeon's office to discuss.  Honor Loh, MSN, APRN, FNP-C, CEN [facility]  Peri-operative Services Nurse Practitioner Phone: [phone] Fax: [phone] 10/13/20 10:15 AM  NOTE: This note has been prepared using Dragon dictation software. Despite my best ability to proofread, there is always the potential that unintentional transcriptional errors may still occur from this process.

## 2020-10-12 NOTE — Telephone Encounter (Signed)
Para March 75 year old female is requesting knee arthroscopy.  She was last seen in the clinic on 06/18/2020.  During that time she reported rare palpitations that were not bothersome.  She denied dizziness, presyncope and syncope.  She denied orthopnea and PND.  She did note mild stable shortness of breath and dyspnea on exertion.  Her PMH includes PSVT, hyperlipidemia, HFpEF, recurrent syncope/near syncope, possible asthma, fibromyalgia, depression, and postherpetic neuralgia.  Her echocardiogram 7/19 showed normal LVEF with mild MR, her nuclear stress test 7/19 showed low risk with no evidence of ischemia.  Her cardiac event monitor 8/19 showed rare PACs/PVCs and several episodes of PSVT.  She was placed on metoprolol with improvement.  May her aspirin be held prior to her procedure?  Thank you for your help.  Please direct your response to [facility] preop pool.  Jossie Ng. Million Maharaj NP-C    10/12/2020, 2:22 PM [address] Office [phone] Fax [phone]

## 2020-10-12 NOTE — Patient Instructions (Signed)
Your procedure is scheduled on:Wednesday October 14, 2020. Report to Day Surgery inside [facility] 2nd floor stop by admissions desk first before getting on elevator. To find out your arrival time please call [phone] between 1PM - 3PM on Tuesday October 13, 2020.  Remember: Instructions that are not followed completely may result in serious medical risk,  up to and including death, or upon the discretion of your surgeon and anesthesiologist your  surgery may need to be rescheduled.     _X__ 1. Do not eat food after midnight the night before your procedure.                 No chewing gum or hard candies. You may drink clear liquids up to 2 hours                 before you are scheduled to arrive for your surgery- DO not drink clear                 liquids within 2 hours of the start of your surgery.                 Clear Liquids include:  water, apple juice without pulp, clear Gatorade, G2 or                  Gatorade Zero (avoid Red/Purple/Blue), Black Coffee or Tea (Do not add                 anything to coffee or tea).  __X__2.   Complete the "Ensure Clear Pre-surgery Clear Carbohydrate Drink" provided to you, 2 hours before arrival. **If you are diabetic you will be provided with an alternative drink, Gatorade Zero or G2.  __X__3.  On the morning of surgery brush your teeth with toothpaste and water, you                may rinse your mouth with mouthwash if you wish.  Do not swallow any toothpaste of mouthwash.     _X__ 4.  No Alcohol for 24 hours before or after surgery.   _X__ 5.  Do Not Smoke or use e-cigarettes For 24 Hours Prior to Your Surgery.                 Do not use any chewable tobacco products for at least 6 hours prior to                 Surgery.  _X__  6.  Do not use any recreational drugs (marijuana, cocaine, heroin, ecstasy, MDMA or other)                For at least one week prior to your surgery.  Combination of these drugs with  anesthesia                May have life threatening results.  __X__7.  Notify your doctor if there is any change in your medical condition      (cold, fever, infections).     Do not wear jewelry, make-up, hairpins, clips or nail polish. Do not wear lotions, powders, or perfumes. You may wear deodorant. Do not shave 48 hours prior to surgery. Do not bring valuables to the hospital.    [facility] is not responsible for any belongings or valuables.  Contacts, dentures or bridgework may not be worn into surgery. Leave your suitcase in the car. After surgery it may be brought to your room. For  patients admitted to the hospital, discharge time is determined by your treatment team.   Patients discharged the day of surgery will not be allowed to drive home.   Make arrangements for someone to be with you for the first 24 hours of your Same Day Discharge.   __X__ Take these medicines the morning of surgery with A SIP OF WATER:    1. busPIRone (BUSPAR) 5 MG  2. metoprolol succinate (TOPROL-XL) 25 MG (1/2)  3. omeprazole (PRILOSEC) 40 MG   4. ezetimibe (ZETIA) 10 MG  5. loratadine (CLARITIN) 10 MG  6.  ____ Fleet Enema (as directed)   __X__ Use CHG Soap (or wipes) as directed  ____ Use Benzoyl Peroxide Gel as instructed  ____ Use inhalers on the day of surgery  ____ Stop metformin 2 days prior to surgery    ____ Take 1/2 of usual insulin dose the night before surgery. No insulin the morning          of surgery.   __X__ Dennis Bast already stopped taking aspirin 81 mg.   __X__ One Week prior to surgery- Stop Anti-inflammatories such as Ibuprofen, Aleve, Advil, Motrin, meloxicam (MOBIC), diclofenac, etodolac, ketorolac, Toradol, Daypro, piroxicam, Goody's or BC powders. OK TO USE TYLENOL IF NEEDED   __X__ Stop supplements until after surgery.    ____ Bring C-Pap to the hospital.    If you have any questions regarding your pre-procedure instructions,  Please call Pre-admit Testing at  [phone].

## 2020-10-12 NOTE — Telephone Encounter (Signed)
   [facility] Pre-operative Risk Assessment    Patient Name: Andrea Savage  DOB: 1946-02-12 MRN: [id number]  HEARTCARE STAFF:  - IMPORTANT!!!!!! Under Visit Info/Reason for Call, type in Other and utilize the format Clearance MM/DD/YY or Clearance TBD. Do not use dashes or single digits. - Please review there is not already an duplicate clearance open for this procedure. - If request is for dental extraction, please clarify the # of teeth to be extracted. - If the patient is currently at the dentist's office, call Pre-Op Callback Staff (MA/nurse) to input urgent request.  - If the patient is not currently in the dentist office, please route to the Pre-Op pool.  Request for surgical clearance:  What type of surgery is being performed? Knee arthroscopy   When is this surgery scheduled? 10/14/2020  What type of clearance is required (medical clearance vs. Pharmacy clearance to hold med vs. Both)? Both   Are there any medications that need to be held prior to surgery and how long? Aspirin   Practice name and name of physician performing surgery? [facility], Dr. Skip Estimable  What is the office phone number? [phone]   7.   What is the office fax number? [phone]  8.   Anesthesia type (None, local, MAC, general) ? General    Mendel Ryder 10/12/2020, 2:03 PM  _________________________________________________________________   (provider comments below)

## 2020-10-13 ENCOUNTER — Encounter
Admission: RE | Admit: 2020-10-13 | Discharge: 2020-10-13 | Disposition: A | Discharge status: Home / Self Care | Payer: PPO | Source: Ambulatory Visit | Attending: Orthopedic Surgery | Admitting: Orthopedic Surgery

## 2020-10-13 DIAGNOSIS — I10 Essential (primary) hypertension: Secondary | ICD-10-CM | POA: Insufficient documentation

## 2020-10-13 DIAGNOSIS — Z01810 Encounter for preprocedural cardiovascular examination: Secondary | ICD-10-CM

## 2020-10-13 DIAGNOSIS — Z01818 Encounter for other preprocedural examination: Secondary | ICD-10-CM | POA: Diagnosis not present

## 2020-10-13 NOTE — Telephone Encounter (Signed)
Per Honor Loh, NP he spoke with Dr End and was given verbal okay for patient to hold asa for procedure scheduled for tomorrow, 10/14/20.

## 2020-10-13 NOTE — Telephone Encounter (Signed)
   Name: Andrea Savage  DOB: 1945-03-14  MRN: [id number]   Primary Cardiologist: Nelva Bush, MD  Chart reviewed as part of pre-operative protocol coverage. Patient was contacted 10/13/2020 in reference to pre-operative risk assessment for pending surgery as outlined below.  Andrea Savage was last seen on 06/18/2020 by Laurann Montana NP.  Since that day, Andrea Savage has done well without any exertional chest pain or worsening shortness of breath.  Patient does not have a prior history of CAD.  Last Myoview obtained in 2019 shows no scar or ischemia.  She has not taken any aspirin for several months.  Given lack of prior diagnosis of CAD, she probably does not need aspirin for prophylactic purposes given her age.  Therefore, based on ACC/AHA guidelines, the patient would be at acceptable risk for the planned procedure without further cardiovascular testing.   The patient was advised that if she develops new symptoms prior to surgery to contact our office to arrange for a follow-up visit, and she verbalized understanding.  I will route this recommendation to the requesting party via Epic fax function and remove from pre-op pool. Please call with questions.  [place], [place] 10/13/2020, 2:38 PM

## 2020-10-14 ENCOUNTER — Encounter: Payer: Self-pay | Admitting: Orthopedic Surgery

## 2020-10-14 ENCOUNTER — Ambulatory Visit: Payer: PPO | Admitting: Urgent Care

## 2020-10-14 ENCOUNTER — Other Ambulatory Visit: Payer: Self-pay

## 2020-10-14 ENCOUNTER — Encounter
Admission: RE | Disposition: A | Discharge status: Home / Self Care | Payer: Self-pay | Source: Home / Self Care | Attending: Orthopedic Surgery

## 2020-10-14 ENCOUNTER — Ambulatory Visit
Admission: RE | Admit: 2020-10-14 | Discharge: 2020-10-14 | Disposition: A | Discharge status: Home / Self Care | Payer: PPO | Attending: Orthopedic Surgery | Admitting: Orthopedic Surgery

## 2020-10-14 DIAGNOSIS — S83271A Complex tear of lateral meniscus, current injury, right knee, initial encounter: Secondary | ICD-10-CM | POA: Diagnosis not present

## 2020-10-14 DIAGNOSIS — Z9889 Other specified postprocedural states: Secondary | ICD-10-CM

## 2020-10-14 DIAGNOSIS — Z79899 Other long term (current) drug therapy: Secondary | ICD-10-CM | POA: Insufficient documentation

## 2020-10-14 DIAGNOSIS — M1711 Unilateral primary osteoarthritis, right knee: Secondary | ICD-10-CM | POA: Diagnosis not present

## 2020-10-14 DIAGNOSIS — X58XXXA Exposure to other specified factors, initial encounter: Secondary | ICD-10-CM | POA: Diagnosis not present

## 2020-10-14 DIAGNOSIS — M23300 Other meniscus derangements, unspecified lateral meniscus, right knee: Secondary | ICD-10-CM | POA: Diagnosis not present

## 2020-10-14 DIAGNOSIS — M23251 Derangement of posterior horn of lateral meniscus due to old tear or injury, right knee: Secondary | ICD-10-CM | POA: Diagnosis not present

## 2020-10-14 DIAGNOSIS — M23241 Derangement of anterior horn of lateral meniscus due to old tear or injury, right knee: Secondary | ICD-10-CM | POA: Diagnosis not present

## 2020-10-14 DIAGNOSIS — Z888 Allergy status to other drugs, medicaments and biological substances status: Secondary | ICD-10-CM | POA: Insufficient documentation

## 2020-10-14 DIAGNOSIS — Z7951 Long term (current) use of inhaled steroids: Secondary | ICD-10-CM | POA: Insufficient documentation

## 2020-10-14 DIAGNOSIS — M2391 Unspecified internal derangement of right knee: Secondary | ICD-10-CM | POA: Diagnosis present

## 2020-10-14 HISTORY — DX: Syncope and collapse: R55

## 2020-10-14 HISTORY — DX: Hyperlipidemia, unspecified: E78.5

## 2020-10-14 HISTORY — DX: Unspecified osteoarthritis, unspecified site: M19.90

## 2020-10-14 HISTORY — DX: Palpitations: R00.2

## 2020-10-14 HISTORY — DX: Unspecified diastolic (congestive) heart failure: I50.30

## 2020-10-14 HISTORY — DX: Essential (primary) hypertension: I10

## 2020-10-14 HISTORY — DX: Other ill-defined heart diseases: I51.89

## 2020-10-14 HISTORY — DX: Other postherpetic nervous system involvement: B02.29

## 2020-10-14 HISTORY — DX: Supraventricular tachycardia: I47.1

## 2020-10-14 HISTORY — DX: Nonrheumatic mitral (valve) insufficiency: I34.0

## 2020-10-14 HISTORY — DX: Anxiety disorder, unspecified: F41.9

## 2020-10-14 HISTORY — PX: KNEE ARTHROSCOPY: SHX127

## 2020-10-14 HISTORY — DX: Supraventricular tachycardia, unspecified: I47.10

## 2020-10-14 SURGERY — ARTHROSCOPY, KNEE
Anesthesia: General | Site: Knee | Laterality: Right

## 2020-10-14 MED ORDER — OXYCODONE HCL 5 MG PO TABS
5.0000 mg | ORAL_TABLET | Freq: Once | ORAL | Status: AC | PRN
  Administered 2020-10-14: 5 mg via ORAL

## 2020-10-14 MED ORDER — CELECOXIB 200 MG PO CAPS: 400.0000 mg | ORAL_CAPSULE | Freq: Once | ORAL | Status: AC

## 2020-10-14 MED ORDER — FENTANYL CITRATE (PF) 100 MCG/2ML IJ SOLN
INTRAMUSCULAR | Status: AC
  Filled 2020-10-14: qty 2

## 2020-10-14 MED ORDER — LACTATED RINGERS IV SOLN: INTRAVENOUS | Status: DC

## 2020-10-14 MED ORDER — OXYCODONE HCL 5 MG PO TABS
ORAL_TABLET | ORAL | Status: AC
  Filled 2020-10-14: qty 1

## 2020-10-14 MED ORDER — MORPHINE SULFATE 4 MG/ML IJ SOLN
INTRAMUSCULAR | Status: DC | PRN
  Administered 2020-10-14: 4 mg via SUBCUTANEOUS

## 2020-10-14 MED ORDER — ACETAMINOPHEN 10 MG/ML IV SOLN
INTRAVENOUS | Status: AC
  Filled 2020-10-14: qty 100

## 2020-10-14 MED ORDER — OXYCODONE HCL 5 MG/5ML PO SOLN
5.0000 mg | Freq: Once | ORAL | Status: AC | PRN
Start: 2020-10-14 — End: 2020-10-14

## 2020-10-14 MED ORDER — MORPHINE SULFATE (PF) 4 MG/ML IV SOLN
INTRAVENOUS | Status: AC
  Filled 2020-10-14: qty 1

## 2020-10-14 MED ORDER — LACTATED RINGERS IR SOLN
Status: DC | PRN
  Administered 2020-10-14 (×3): 3000 mL

## 2020-10-14 MED ORDER — MENTHOL 3 MG MT LOZG
1.0000 | LOZENGE | OROMUCOSAL | Status: DC | PRN
  Filled 2020-10-14: qty 9

## 2020-10-14 MED ORDER — FENTANYL CITRATE (PF) 100 MCG/2ML IJ SOLN
INTRAMUSCULAR | Status: DC | PRN
  Administered 2020-10-14 (×3): 50 ug via INTRAVENOUS

## 2020-10-14 MED ORDER — FENTANYL CITRATE (PF) 100 MCG/2ML IJ SOLN: 25.0000 ug | INTRAMUSCULAR | Status: DC | PRN

## 2020-10-14 MED ORDER — ACETAMINOPHEN 10 MG/ML IV SOLN: 1000.0000 mg | Freq: Once | INTRAVENOUS | Status: DC | PRN

## 2020-10-14 MED ORDER — ONDANSETRON HCL 4 MG/2ML IJ SOLN: 4.0000 mg | Freq: Once | INTRAMUSCULAR | Status: DC | PRN

## 2020-10-14 MED ORDER — DEXAMETHASONE SODIUM PHOSPHATE 10 MG/ML IJ SOLN
INTRAMUSCULAR | Status: DC | PRN
  Administered 2020-10-14: 10 mg via INTRAVENOUS

## 2020-10-14 MED ORDER — ONDANSETRON HCL 4 MG/2ML IJ SOLN: 4.0000 mg | Freq: Four times a day (QID) | INTRAMUSCULAR | Status: DC | PRN

## 2020-10-14 MED ORDER — ONDANSETRON HCL 4 MG PO TABS: 4.0000 mg | ORAL_TABLET | Freq: Four times a day (QID) | ORAL | Status: DC | PRN

## 2020-10-14 MED ORDER — METOCLOPRAMIDE HCL 10 MG PO TABS: 5.0000 mg | ORAL_TABLET | Freq: Three times a day (TID) | ORAL | Status: DC | PRN

## 2020-10-14 MED ORDER — ORAL CARE MOUTH RINSE: 15.0000 mL | Freq: Once | OROMUCOSAL | Status: AC

## 2020-10-14 MED ORDER — SODIUM CHLORIDE 0.9 % IV SOLN: INTRAVENOUS | Status: DC

## 2020-10-14 MED ORDER — HYDROCODONE-ACETAMINOPHEN 5-325 MG PO TABS: 1.0000 | ORAL_TABLET | ORAL | 0 refills | Status: DC | PRN

## 2020-10-14 MED ORDER — CELECOXIB 200 MG PO CAPS
ORAL_CAPSULE | ORAL | Status: AC
  Administered 2020-10-14: 400 mg via ORAL
  Filled 2020-10-14: qty 2

## 2020-10-14 MED ORDER — PROPOFOL 10 MG/ML IV BOLUS
INTRAVENOUS | Status: DC | PRN
  Administered 2020-10-14: 140 mg via INTRAVENOUS

## 2020-10-14 MED ORDER — PHENYLEPHRINE HCL (PRESSORS) 10 MG/ML IV SOLN
INTRAVENOUS | Status: DC | PRN
  Administered 2020-10-14 (×6): 100 ug via INTRAVENOUS

## 2020-10-14 MED ORDER — METOCLOPRAMIDE HCL 5 MG/ML IJ SOLN: 5.0000 mg | Freq: Three times a day (TID) | INTRAMUSCULAR | Status: DC | PRN

## 2020-10-14 MED ORDER — CHLORHEXIDINE GLUCONATE 0.12 % MT SOLN
OROMUCOSAL | Status: AC
  Administered 2020-10-14: 15 mL via OROMUCOSAL
  Filled 2020-10-14: qty 15

## 2020-10-14 MED ORDER — ROCURONIUM BROMIDE 100 MG/10ML IV SOLN
INTRAVENOUS | Status: DC | PRN
  Administered 2020-10-14: 50 mg via INTRAVENOUS
  Administered 2020-10-14: 30 mg via INTRAVENOUS

## 2020-10-14 MED ORDER — ONDANSETRON HCL 4 MG/2ML IJ SOLN
INTRAMUSCULAR | Status: DC | PRN
  Administered 2020-10-14: 4 mg via INTRAVENOUS

## 2020-10-14 MED ORDER — BUPIVACAINE-EPINEPHRINE 0.25% -1:200000 IJ SOLN
INTRAMUSCULAR | Status: DC | PRN
  Administered 2020-10-14: 30 mL

## 2020-10-14 MED ORDER — CHLORHEXIDINE GLUCONATE 0.12 % MT SOLN
15.0000 mL | Freq: Once | OROMUCOSAL | Status: AC
Start: 2020-10-14 — End: 2020-10-14

## 2020-10-14 MED ORDER — LIDOCAINE HCL (CARDIAC) PF 100 MG/5ML IV SOSY
PREFILLED_SYRINGE | INTRAVENOUS | Status: DC | PRN
  Administered 2020-10-14: 80 mg via INTRAVENOUS

## 2020-10-14 MED ORDER — ACETAMINOPHEN 10 MG/ML IV SOLN
INTRAVENOUS | Status: DC | PRN
Start: 2020-10-14 — End: 2020-10-14
  Administered 2020-10-14: 1000 mg via INTRAVENOUS

## 2020-10-14 MED ORDER — BUPIVACAINE-EPINEPHRINE (PF) 0.25% -1:200000 IJ SOLN
INTRAMUSCULAR | Status: AC
  Filled 2020-10-14: qty 30

## 2020-10-14 SURGICAL SUPPLY — 30 items
ADAPTER IRRIG TUBE 2 SPIKE SOL (ADAPTER) ×3 IMPLANT
ADPR TBG 2 SPK PMP STRL ASCP (ADAPTER) ×1
BLADE SHAVER 4.5 DBL SERAT CV (CUTTER) ×1 IMPLANT
BNDG ELASTIC 4X5.8 VLCR STR LF (GAUZE/BANDAGES/DRESSINGS) ×1 IMPLANT
CUFF TOURN SGL QUICK 24 (TOURNIQUET CUFF)
CUFF TOURN SGL QUICK 34 (TOURNIQUET CUFF)
CUFF TRNQT CYL 24X4X16.5-23 (TOURNIQUET CUFF) IMPLANT
CUFF TRNQT CYL 34X4.125X (TOURNIQUET CUFF) IMPLANT
DRAPE ARTHRO LIMB 89X125 STRL (DRAPES) ×2 IMPLANT
DRSG DERMACEA 8X12 NADH (GAUZE/BANDAGES/DRESSINGS) ×2 IMPLANT
DURAPREP 26ML APPLICATOR (WOUND CARE) ×4 IMPLANT
GAUZE SPONGE 4X4 12PLY STRL (GAUZE/BANDAGES/DRESSINGS) ×2 IMPLANT
GLOVE SURG ENC TEXT LTX SZ7.5 (GLOVE) ×2 IMPLANT
GLOVE SURG UNDER LTX SZ8 (GLOVE) ×2 IMPLANT
GOWN STRL REUS W/ TWL LRG LVL3 (GOWN DISPOSABLE) ×2 IMPLANT
GOWN STRL REUS W/TWL LRG LVL3 (GOWN DISPOSABLE) ×4
IV LACTATED RINGER IRRG 3000ML (IV SOLUTION) ×6
IV LR IRRIG 3000ML ARTHROMATIC (IV SOLUTION) ×6 IMPLANT
KIT TURNOVER KIT A (KITS) ×2 IMPLANT
MANIFOLD NEPTUNE II (INSTRUMENTS) ×3 IMPLANT
PACK ARTHROSCOPY KNEE (MISCELLANEOUS) ×2 IMPLANT
SOL PREP PVP 2OZ (MISCELLANEOUS) ×2
SOLUTION PREP PVP 2OZ (MISCELLANEOUS) ×1 IMPLANT
SPONGE T-LAP 18X18 ~~LOC~~+RFID (SPONGE) ×2 IMPLANT
SUT ETHILON 3-0 FS-10 30 BLK (SUTURE) ×2
SUTURE EHLN 3-0 FS-10 30 BLK (SUTURE) ×1 IMPLANT
TUBING INFLOW SET DBFLO PUMP (TUBING) ×2 IMPLANT
TUBING OUTFLOW SET DBLFO PUMP (TUBING) ×2 IMPLANT
WAND HAND CNTRL MULTIVAC 50 (MISCELLANEOUS) ×2 IMPLANT
WRAP KNEE W/COLD PACKS 25.5X14 (SOFTGOODS) ×2 IMPLANT

## 2020-10-14 NOTE — Transfer of Care (Signed)
Immediate Anesthesia Transfer of Care Note  Patient: Andrea Savage  Procedure(s) Performed: ARTHROSCOPY KNEE, LATERAL MENISECTOMY (Right: Knee)  Patient Location: PACU  Anesthesia Type:General  Level of Consciousness: awake, alert  and oriented  Airway & Oxygen Therapy: Patient Spontanous Breathing and Patient connected to face mask oxygen  Post-op Assessment: Report given to RN and Post -op Vital signs reviewed and stable  Post vital signs: Reviewed and stable  Last Vitals:  Vitals Value Taken Time  BP 146/89 10/14/20 1655  Temp    Pulse 91 10/14/20 1659  Resp 14 10/14/20 1659  SpO2 100 % 10/14/20 1659  Vitals shown include unvalidated device data.  Last Pain:  Vitals:   10/14/20 1430  TempSrc: Temporal  PainSc: 8       Patients Stated Pain Goal: 3 ([id number])  Complications: No notable events documented.

## 2020-10-14 NOTE — Anesthesia Preprocedure Evaluation (Addendum)
Anesthesia Evaluation  Patient identified by MRN, date of birth, ID band Patient awake    Reviewed: Allergy & Precautions, NPO status , Patient's Chart, lab work & pertinent test results  History of Anesthesia Complications Negative for: history of anesthetic complications  Airway Mallampati: II  TM Distance: >3 FB Neck ROM: Full    Dental no notable dental hx. (+) Teeth Intact   Pulmonary asthma , neg sleep apnea, neg COPD, Patient abstained from smoking.Not current smoker,  Patient with chronic cough and vocal cord dysfunction; says she has received steroid injections in the neck to help with this, as well as endoscopies for evaluation. She was told the endoscopy was difficult because of her frequent coughing.   Pulmonary exam normal breath sounds clear to auscultation       Cardiovascular Exercise Tolerance: Good METShypertension, pulmonary hypertension(-) CAD and (-) Past MI (-) dysrhythmias + Valvular Problems/Murmurs MR  Rhythm:Regular Rate:Normal - Systolic murmurs TTE [id number]: 1. Left ventricular ejection fraction, by estimation, is 60 to 65%. The  left ventricle has normal function. The left ventricle has no regional  wall motion abnormalities. Left ventricular diastolic parameters are  consistent with Grade II diastolic  dysfunction (pseudonormalization).  2. Right ventricular systolic function is normal. The right ventricular  size is normal. There is mildly elevated pulmonary artery systolic  pressure.  3. The mitral valve is normal in structure. Mild to moderate mitral valve  regurgitation.  4. The inferior vena cava is dilated in size with <50% respiratory  variability, suggesting right atrial pressure of 15 mmHg.    Neuro/Psych PSYCHIATRIC DISORDERS Anxiety Depression Seizure 2/2 compazine in the past  Neuromuscular disease    GI/Hepatic hiatal hernia, GERD  Medicated,(+)     (-) substance abuse  ,    Endo/Other  neg diabetes  Renal/GU negative Renal ROS     Musculoskeletal  (+) Arthritis , Osteoarthritis,    Abdominal   Peds  Hematology   Anesthesia Other Findings Past Medical History: No date: (HFpEF) heart failure with preserved ejection fraction (HCC) No date: Anxiety No date: Asthma No date: Depression No date: Fibromyalgia No date: GERD (gastroesophageal reflux disease) No date: Grade II diastolic dysfunction No date: H/O scarlet fever     Comment:  as child No date: Heart murmur     Comment:  s/p scarlet fever as child No date: HLD (hyperlipidemia) No date: HTN (hypertension) No date: IBS (irritable bowel syndrome) No date: Low bone density No date: Mitral regurgitation No date: OA (osteoarthritis) No date: Osteopenia No date: Palpitations No date: PHN (postherpetic neuralgia) No date: PSVT (paroxysmal supraventricular tachycardia) (HCC) No date: Recurrent syncope No date: Rosacea 1978: Seizure ([place])     Comment:  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 No date: Wears hearing aid in both ears  Reproductive/Obstetrics                            Anesthesia Physical Anesthesia Plan  ASA: 2  Anesthesia Plan: General   Post-op Pain Management:    Induction: Intravenous  PONV Risk Score and Plan: 3 and Ondansetron, Dexamethasone and Treatment may vary due to age or medical condition  Airway Management Planned: Oral ETT  Additional Equipment: None  Intra-op Plan:   Post-operative Plan: Extubation in OR  Informed Consent: I have reviewed the patients History and Physical, chart, labs and discussed the procedure including the risks, benefits and alternatives for the proposed anesthesia with the patient or authorized  representative who has indicated his/her understanding and acceptance.     Dental advisory given  Plan Discussed with: CRNA and Surgeon  Anesthesia Plan Comments: (Given patient's history of vocal cord issues  and chronic cough, electing to proceed with GETA rather than LMA. Discussed risks of anesthesia with patient, including PONV, sore throat, lip/dental/vocal cord damage. Rare risks discussed as well, such as cardiorespiratory and neurological sequelae, and allergic reactions. Patient understands.)        Anesthesia Quick Evaluation

## 2020-10-14 NOTE — Anesthesia Procedure Notes (Signed)
Procedure Name: Intubation Date/Time: 10/14/2020 3:24 PM Performed by: Nelda Marseille, CRNA Pre-anesthesia Checklist: Patient identified, Patient being monitored, Timeout performed, Emergency Drugs available and Suction available Patient Re-evaluated:Patient Re-evaluated prior to induction Oxygen Delivery Method: Circle system utilized Preoxygenation: Pre-oxygenation with 100% oxygen Induction Type: IV induction Ventilation: Mask ventilation without difficulty Laryngoscope Size: Mac, 3 and McGraph Grade View: Grade I Tube type: Oral Tube size: 6.5 mm Number of attempts: 1 Airway Equipment and Method: Stylet and Video-laryngoscopy Placement Confirmation: ETT inserted through vocal cords under direct vision, positive ETCO2 and breath sounds checked- equal and bilateral Secured at: 22 cm Tube secured with: Tape Dental Injury: Teeth and Oropharynx as per pre-operative assessment  Comments: McGrath used 3 MAC blade

## 2020-10-14 NOTE — Progress Notes (Signed)
Pt has a nagging cough. Pt states this happens often for her. Dr. Marry Guan notified. Acknowledged. Orders received.

## 2020-10-14 NOTE — H&P (Signed)
The patient has been re-examined, and the chart reviewed, and there have been no interval changes to the documented history and physical.    The risks, benefits, and alternatives have been discussed at length. The patient expressed understanding of the risks benefits and agreed with plans for surgical intervention.  Andrea Savage, Jr. M.D.    

## 2020-10-14 NOTE — Op Note (Signed)
OPERATIVE NOTE  DATE OF SURGERY:  10/14/2020  PATIENT NAME:  Andrea Savage   DOB: 1945-07-20  MRN: [id number]   PRE-OPERATIVE DIAGNOSIS:  Internal derangement of the right knee   POST-OPERATIVE DIAGNOSIS:   Complex tear of the anterior and posterior horns of the lateral meniscus, right knee  PROCEDURE:  Right knee arthroscopy, partial lateral meniscectomy  SURGEON:  Marciano Sequin., M.D.   ANESTHESIA: general  ESTIMATED BLOOD LOSS: Minimal  FLUIDS REPLACED: 1000 mL of crystalloid  TOURNIQUET TIME: Not used  INDICATIONS FOR SURGERY: Andrea Savage is a 75 y.o. year old female who has been seen for complaints of right knee pain. MRI demonstrated findings consistent with meniscal pathology. After discussion of the risks and benefits of surgical intervention, the patient expressed understanding of the risks benefits and agree with plans for right knee arthroscopy.   PROCEDURE IN DETAIL: The patient was brought into the operating room and, after adequate general anesthesia was achieved, a tourniquet was applied to the right thigh and the leg was placed in the leg holder. All bony prominences were well padded. The patient's right knee was cleaned and prepped with alcohol and Duraprep and draped in the usual sterile fashion. A "timeout" was performed as per usual protocol. The anticipated portal sites were injected with 0.25% Marcaine with epinephrine. An anterolateral incision was made and a cannula was inserted. A moderate effusion was evacuated and the knee was distended with fluid using the pump. The scope was advanced down the medial gutter into the medial compartment. Under visualization with the scope, an anteromedial portal was created and a hooked probe was inserted. The medial meniscus was visualized and probed.  The medial meniscus was intact without evidence of tear or instability.  The articular cartilage was visualized and noted to be in reasonably good condition.  The  scope was then advanced into the intercondylar notch. The anterior cruciate ligament was visualized and probed and felt to be intact. The scope was removed from the lateral portal and reinserted via the anteromedial portal to better visualize the lateral compartment. The lateral meniscus was visualized and probed.  There was a complex macerated tear of the anterior horn with some lesser changes to the posterior horn.  These areas were debrided using meniscal punches and a 4.5 mm incisor shaver.  Final contouring was performed using a 50 degree ArthroCare wand.  Remaining rim of meniscus was visualized and probed felt be stable.  The articular cartilage of the lateral compartment was visualized.  There were some grade 2 to early grade 3 changes of chondromalacia.  These areas were debrided using the ArthroCare wand.  Finally, the scope was advanced so as to visualize the patellofemoral articulation. Good patellar tracking was appreciated.  The articular surface was in good condition.  The knee was irrigated with copius amounts of fluid and suctioned dry. The anterolateral portal was re-approximated with #3-0 nylon. A combination of 0.25% Marcaine with epinephrine and 4 mg of Morphine were injected via the scope. The scope was removed and the anteromedial portal was re-approximated with #3-0 nylon. A sterile dressing was applied followed by application of an ice wrap.  The patient tolerated the procedure well and was transported to the PACU in stable condition.  Andrea Vanliew P. Holley Bouche., M.D.

## 2020-10-15 ENCOUNTER — Encounter: Payer: Self-pay | Admitting: Orthopedic Surgery

## 2020-10-15 NOTE — Anesthesia Postprocedure Evaluation (Signed)
Anesthesia Post Note  Patient: Andrea Savage  Procedure(s) Performed: ARTHROSCOPY KNEE, LATERAL MENISECTOMY (Right: Knee)  Patient location during evaluation: PACU Anesthesia Type: General Level of consciousness: awake and alert Pain management: pain level controlled Vital Signs Assessment: post-procedure vital signs reviewed and stable Respiratory status: spontaneous breathing, nonlabored ventilation, respiratory function stable and patient connected to nasal cannula oxygen Cardiovascular status: blood pressure returned to baseline and stable Postop Assessment: no apparent nausea or vomiting Anesthetic complications: no   No notable events documented.   Last Vitals:  Vitals:   10/14/20 1730 10/14/20 1740  BP: 136/75 (!) 156/74  Pulse: 76 83  Resp: 15 16  Temp: 36.8 C (!) 36.2 C  SpO2: 93% 95%    Last Pain:  Vitals:   10/14/20 1740  TempSrc: Temporal  PainSc: 5                  Arita Miss

## 2020-10-21 DIAGNOSIS — H43811 Vitreous degeneration, right eye: Secondary | ICD-10-CM | POA: Diagnosis not present

## 2020-10-28 DIAGNOSIS — J301 Allergic rhinitis due to pollen: Secondary | ICD-10-CM | POA: Diagnosis not present

## 2020-11-02 DIAGNOSIS — Z4789 Encounter for other orthopedic aftercare: Secondary | ICD-10-CM | POA: Diagnosis not present

## 2020-11-04 DIAGNOSIS — J301 Allergic rhinitis due to pollen: Secondary | ICD-10-CM | POA: Diagnosis not present

## 2020-11-10 DIAGNOSIS — Z4789 Encounter for other orthopedic aftercare: Secondary | ICD-10-CM | POA: Diagnosis not present

## 2020-11-11 DIAGNOSIS — J301 Allergic rhinitis due to pollen: Secondary | ICD-10-CM | POA: Diagnosis not present

## 2020-11-18 DIAGNOSIS — K219 Gastro-esophageal reflux disease without esophagitis: Secondary | ICD-10-CM | POA: Diagnosis not present

## 2020-11-18 DIAGNOSIS — R053 Chronic cough: Secondary | ICD-10-CM | POA: Diagnosis not present

## 2020-11-18 DIAGNOSIS — J301 Allergic rhinitis due to pollen: Secondary | ICD-10-CM | POA: Diagnosis not present

## 2020-11-18 DIAGNOSIS — E782 Mixed hyperlipidemia: Secondary | ICD-10-CM | POA: Diagnosis not present

## 2020-11-18 DIAGNOSIS — M797 Fibromyalgia: Secondary | ICD-10-CM | POA: Diagnosis not present

## 2020-11-18 DIAGNOSIS — M25561 Pain in right knee: Secondary | ICD-10-CM | POA: Diagnosis not present

## 2020-11-18 DIAGNOSIS — Z4789 Encounter for other orthopedic aftercare: Secondary | ICD-10-CM | POA: Diagnosis not present

## 2020-11-25 DIAGNOSIS — Z4789 Encounter for other orthopedic aftercare: Secondary | ICD-10-CM | POA: Diagnosis not present

## 2020-12-01 DIAGNOSIS — M1711 Unilateral primary osteoarthritis, right knee: Secondary | ICD-10-CM | POA: Diagnosis not present

## 2020-12-02 DIAGNOSIS — J301 Allergic rhinitis due to pollen: Secondary | ICD-10-CM | POA: Diagnosis not present

## 2020-12-02 DIAGNOSIS — Z4789 Encounter for other orthopedic aftercare: Secondary | ICD-10-CM | POA: Diagnosis not present

## 2020-12-07 ENCOUNTER — Encounter
Admission: RE | Admit: 2020-12-07 | Discharge: 2020-12-07 | Disposition: A | Discharge status: Home / Self Care | Payer: PPO | Source: Ambulatory Visit | Attending: Orthopedic Surgery | Admitting: Orthopedic Surgery

## 2020-12-07 ENCOUNTER — Other Ambulatory Visit: Payer: Self-pay

## 2020-12-07 DIAGNOSIS — Z01812 Encounter for preprocedural laboratory examination: Secondary | ICD-10-CM | POA: Diagnosis not present

## 2020-12-07 LAB — SEDIMENTATION RATE: Sed Rate: 9 mm/hr (ref 0–30)

## 2020-12-07 LAB — CBC
HCT: 40.6 % (ref 36.0–46.0)
Hemoglobin: 13.8 g/dL (ref 12.0–15.0)
MCH: 31.7 pg (ref 26.0–34.0)
MCHC: 34 g/dL (ref 30.0–36.0)
MCV: 93.1 fL (ref 80.0–100.0)
Platelets: 195 10*3/uL (ref 150–400)
RBC: 4.36 MIL/uL (ref 3.87–5.11)
RDW: 12.4 % (ref 11.5–15.5)
WBC: 4.8 10*3/uL (ref 4.0–10.5)
nRBC: 0 % (ref 0.0–0.2)

## 2020-12-07 LAB — C-REACTIVE PROTEIN: CRP: 0.6 mg/dL (ref <1.0)

## 2020-12-07 LAB — BASIC METABOLIC PANEL
Anion gap: 6 (ref 5–15)
BUN: 17 mg/dL (ref 8–23)
CO2: 29 mmol/L (ref 22–32)
Calcium: 9.2 mg/dL (ref 8.9–10.3)
Chloride: 102 mmol/L (ref 98–111)
Creatinine, Ser: 0.86 mg/dL (ref 0.44–1.00)
GFR, Estimated: >60 mL/min (ref >60)
Glucose, Bld: 77 mg/dL (ref 70–99)
Potassium: 4 mmol/L (ref 3.5–5.1)
Sodium: 137 mmol/L (ref 135–145)

## 2020-12-07 LAB — SURGICAL PCR SCREEN
MRSA, PCR: NEGATIVE
Staphylococcus aureus: NEGATIVE

## 2020-12-07 LAB — URINALYSIS, ROUTINE W REFLEX MICROSCOPIC
Bilirubin Urine: NEGATIVE
Glucose, UA: NEGATIVE mg/dL
Ketones, ur: NEGATIVE mg/dL
Nitrite: NEGATIVE
Protein, ur: NEGATIVE mg/dL
Specific Gravity, Urine: 1.01 (ref 1.005–1.030)
pH: 6 (ref 5.0–8.0)

## 2020-12-07 LAB — PROTIME-INR
INR: 1 (ref 0.8–1.2)
Prothrombin Time: 13.6 seconds (ref 11.4–15.2)

## 2020-12-07 LAB — TYPE AND SCREEN
ABO/RH(D): O POS
Antibody Screen: NEGATIVE

## 2020-12-07 LAB — APTT: aPTT: 33 seconds (ref 24–36)

## 2020-12-07 NOTE — Patient Instructions (Signed)
Your procedure is scheduled on: 12/14/2020 Report to the Registration Desk on the 1st floor of the [facility]. To find out your arrival time, please call [phone] between 1PM - 3PM on: 12/11/2020  REMEMBER: Instructions that are not followed completely may result in serious medical risk, up to and including death; or upon the discretion of your surgeon and anesthesiologist your surgery may need to be rescheduled.  Do not eat food after midnight the night before surgery.  No gum chewing, lozengers or hard candies.  You may however, drink CLEAR liquids up to 2 hours before you are scheduled to arrive for your surgery. Do not drink anything within 2 hours of your scheduled arrival time.  Clear liquids include: - water  - apple juice without pulp - gatorade  - black coffee or tea (Do NOT add milk or creamers to the coffee or tea) Do NOT drink anything that is not on this list.   In addition, your doctor has ordered for you to drink the provided  Ensure Pre-Surgery Clear Carbohydrate Drink  Drinking this carbohydrate drink up to two hours before surgery helps to reduce insulin resistance and improve patient outcomes. Please complete drinking 2 hours prior to scheduled arrival time.  TAKE THESE MEDICATIONS THE MORNING OF SURGERY WITH A SIP OF WATER: - busPIRone (BUSPAR) 5 MG  - loratadine (CLARITIN) 10 MG - metoprolol succinate (TOPROL-XL) 25 MG - citalopram (CELEXA) 20 MG  - omeprazole (PRILOSEC) 40 MG (take one the night before and one on the morning of surgery - helps to prevent nausea after surgery.)  Follow recommendations from Cardiologist, Pulmonologist or PCP regarding stopping Aspirin, Coumadin, Plavix, Eliquis, Pradaxa, or Pletal.  One week prior to surgery: Stop Anti-inflammatories (NSAIDS) such as Advil, Aleve, Ibuprofen, Motrin, Naproxen, Naprosyn and Aspirin based products such as Excedrin, Goodys Powder, BC Powder. Stop ANY OVER THE COUNTER vitamins supplements  until after surgery. You may however, continue to take Tylenol if needed for pain up until the day of surgery.  No Alcohol for 24 hours before or after surgery.  No Smoking including e-cigarettes for 24 hours prior to surgery.  No chewable tobacco products for at least 6 hours prior to surgery.  No nicotine patches on the day of surgery.  Do not use any "recreational" drugs for at least a week prior to your surgery.  Please be advised that the combination of cocaine and anesthesia may have negative outcomes, up to and including death. If you test positive for cocaine, your surgery will be cancelled.  On the morning of surgery brush your teeth with toothpaste and water, you may rinse your mouth with mouthwash if you wish. Do not swallow any toothpaste or mouthwash.  Use CHG Soap or wipes as directed on instruction sheet.  Do not wear jewelry, make-up, hairpins, clips or nail polish.  Do not wear lotions, powders, or perfumes.   Do not shave body from the neck down 48 hours prior to surgery just in case you cut yourself which could leave a site for infection.  Also, freshly shaved skin may become irritated if using the CHG soap.  Contact lenses, hearing aids and dentures may not be worn into surgery.  Do not bring valuables to the hospital. [facility] is not responsible for any missing/lost belongings or valuables.   Notify your doctor if there is any change in your medical condition (cold, fever, infection).  Wear comfortable clothing (specific to your surgery type) to the hospital.  If you  are being admitted to the hospital overnight, leave your suitcase in the car. After surgery it may be brought to your room.  If you are being discharged the day of surgery, you will not be allowed to drive home. You will need a responsible adult (18 years or older) to drive you home and stay with you that night.   If you are taking public transportation, you will need to have a responsible  adult (18 years or older) with you. Please confirm with your physician that it is acceptable to use public transportation.   Please call the [facility] Dept. at [phone] if you have any questions about these instructions.  Surgery Visitation Policy:  Patients undergoing a surgery or procedure may have one family member or support person with them as long as that person is not COVID-19 positive or experiencing its symptoms.  That person may remain in the waiting area during the procedure and may rotate out with other people.  Inpatient Visitation:    Visiting hours are 7 a.m. to 8 p.m. Up to two visitors ages 16+ are allowed at one time in a patient room. The visitors may rotate out with other people during the day. Visitors must check out when they leave, or other visitors will not be allowed. One designated support person may remain overnight. The visitor must pass COVID-19 screenings, use hand sanitizer when entering and exiting the patient's room and wear a mask at all times, including in the patient's room. Patients must also wear a mask when staff or their visitor are in the room. Masking is required regardless of vaccination status.

## 2020-12-08 LAB — URINE CULTURE

## 2020-12-10 ENCOUNTER — Other Ambulatory Visit: Payer: Self-pay

## 2020-12-10 ENCOUNTER — Other Ambulatory Visit
Admission: RE | Admit: 2020-12-10 | Discharge: 2020-12-10 | Disposition: A | Discharge status: Home / Self Care | Payer: PPO | Source: Ambulatory Visit | Attending: Orthopedic Surgery | Admitting: Orthopedic Surgery

## 2020-12-10 DIAGNOSIS — Z01812 Encounter for preprocedural laboratory examination: Secondary | ICD-10-CM | POA: Insufficient documentation

## 2020-12-10 DIAGNOSIS — Z20822 Contact with and (suspected) exposure to covid-19: Secondary | ICD-10-CM | POA: Insufficient documentation

## 2020-12-10 LAB — SARS CORONAVIRUS 2 (TAT 6-24 HRS): SARS Coronavirus 2: NEGATIVE

## 2020-12-10 NOTE — Discharge Instructions (Signed)
Instructions after Total Knee Replacement   Ki Corbo P. Jacorie Ernsberger, Jr., M.D.     Dept. of Orthopaedics & Sports Medicine  [facility]  [address]  [place], [place]  [address]  Phone: [phone]   Fax: [phone]    DIET: Drink plenty of non-alcoholic fluids. Resume your normal diet. Include foods high in fiber.  ACTIVITY:  You may use crutches or a walker with weight-bearing as tolerated, unless instructed otherwise. You may be weaned off of the walker or crutches by your Physical Therapist.  Do NOT place pillows under the knee. Anything placed under the knee could limit your ability to straighten the knee.   Continue doing gentle exercises. Exercising will reduce the pain and swelling, increase motion, and prevent muscle weakness.   Please continue to use the TED compression stockings for 6 weeks. You may remove the stockings at night, but should reapply them in the morning. Do not drive or operate any equipment until instructed.  WOUND CARE:  Continue to use the PolarCare or ice packs periodically to reduce pain and swelling. You may bathe or shower after the staples are removed at the first office visit following surgery.  MEDICATIONS: You may resume your regular medications. Please take the pain medication as prescribed on the medication. Do not take pain medication on an empty stomach. You have been given a prescription for a blood thinner (Lovenox or Coumadin). Please take the medication as instructed. (NOTE: After completing a 2 week course of Lovenox, take one Enteric-coated aspirin once a day. This along with elevation will help reduce the possibility of phlebitis in your operated leg.) Do not drive or drink alcoholic beverages when taking pain medications.  CALL THE OFFICE FOR: Temperature above 101 degrees Excessive bleeding or drainage on the dressing. Excessive swelling, coldness, or paleness of the toes. Persistent nausea and vomiting.  FOLLOW-UP:  You  should have an appointment to return to the office in 10-14 days after surgery. Arrangements have been made for continuation of Physical Therapy (either home therapy or outpatient therapy).   [facility] Department Directory         [email or web address]       [email or web address]          Cardiology  Appointments: [place] - [phone] [place] - [phone]  Endocrinology  Appointments: [place] - [phone] [place] - [phone]  Gastroenterology  Appointments: [place] - [phone] [place] - [phone]        General Surgery   Appointments: [place] - [phone]  Internal Medicine/Family Medicine  Appointments: [place] - [phone] [place] - [phone] [place] - [phone]  Metabolic and Weigh Loss Surgery  Appointments: [place] - [phone]        Neurology  Appointments: [place] - [phone] [place] - [phone]  Neurosurgery  Appointments: [place] - [phone]  Obstetrics & Gynecology  Appointments: [place] - [phone] [place] - [phone]        Pediatrics  Appointments: [place] - [phone] [place] - [phone]  Physiatry  Appointments: [place] -[phone]  Physical Therapy  Appointments: [place] - [phone] [place] - [phone]        Podiatry  Appointments: [place] - [phone] [place] - [phone]  Pulmonology  Appointments: [place] - [phone]  Rheumatology  Appointments: [place] - [phone]        [place] Location: [facility]  [address] , [place]  [address]  [place] Location: [facility] [address] [place], [place]  [address]  [place] Location: [facility] [address] [place], [place]  [address]    

## 2020-12-13 ENCOUNTER — Encounter: Payer: Self-pay | Admitting: Orthopedic Surgery
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
## 2020-12-14 ENCOUNTER — Encounter: Payer: Self-pay | Admitting: Orthopedic Surgery

## 2020-12-14 ENCOUNTER — Other Ambulatory Visit: Payer: Self-pay

## 2020-12-14 ENCOUNTER — Ambulatory Visit: Payer: PPO | Admitting: Urgent Care

## 2020-12-14 ENCOUNTER — Observation Stay
Admission: RE | Admit: 2020-12-14 | Discharge: 2020-12-15 | Disposition: A | Discharge status: Home / Self Care | Payer: PPO | Attending: Orthopedic Surgery | Admitting: Orthopedic Surgery

## 2020-12-14 ENCOUNTER — Observation Stay: Payer: PPO

## 2020-12-14 ENCOUNTER — Encounter
Admission: RE | Disposition: A | Discharge status: Home / Self Care | Payer: Self-pay | Source: Home / Self Care | Attending: Orthopedic Surgery

## 2020-12-14 DIAGNOSIS — Z96651 Presence of right artificial knee joint: Secondary | ICD-10-CM | POA: Diagnosis not present

## 2020-12-14 DIAGNOSIS — I110 Hypertensive heart disease with heart failure: Secondary | ICD-10-CM | POA: Insufficient documentation

## 2020-12-14 DIAGNOSIS — Z471 Aftercare following joint replacement surgery: Secondary | ICD-10-CM | POA: Diagnosis not present

## 2020-12-14 DIAGNOSIS — M1711 Unilateral primary osteoarthritis, right knee: Principal | ICD-10-CM | POA: Insufficient documentation

## 2020-12-14 DIAGNOSIS — I5032 Chronic diastolic (congestive) heart failure: Secondary | ICD-10-CM | POA: Insufficient documentation

## 2020-12-14 DIAGNOSIS — J45909 Unspecified asthma, uncomplicated: Secondary | ICD-10-CM | POA: Diagnosis not present

## 2020-12-14 DIAGNOSIS — Z96659 Presence of unspecified artificial knee joint: Secondary | ICD-10-CM

## 2020-12-14 DIAGNOSIS — Z9889 Other specified postprocedural states: Secondary | ICD-10-CM | POA: Diagnosis not present

## 2020-12-14 DIAGNOSIS — Z79899 Other long term (current) drug therapy: Secondary | ICD-10-CM | POA: Insufficient documentation

## 2020-12-14 HISTORY — PX: KNEE ARTHROPLASTY: SHX992

## 2020-12-14 LAB — ABO/RH: ABO/RH(D): O POS

## 2020-12-14 SURGERY — ARTHROPLASTY, KNEE, TOTAL, USING IMAGELESS COMPUTER-ASSISTED NAVIGATION
Anesthesia: Spinal | Site: Knee | Laterality: Right

## 2020-12-14 MED ORDER — MENTHOL 3 MG MT LOZG
1.0000 | LOZENGE | OROMUCOSAL | Status: DC | PRN
  Administered 2020-12-14: 3 mg via ORAL
  Filled 2020-12-14 (×2): qty 9

## 2020-12-14 MED ORDER — 0.9 % SODIUM CHLORIDE (POUR BTL) OPTIME
TOPICAL | Status: DC | PRN
  Administered 2020-12-14: 500 mL

## 2020-12-14 MED ORDER — HYDROMORPHONE HCL 1 MG/ML IJ SOLN: 0.5000 mg | INTRAMUSCULAR | Status: DC | PRN

## 2020-12-14 MED ORDER — ENSURE PRE-SURGERY PO LIQD
296.0000 mL | Freq: Once | ORAL | Status: AC
  Administered 2020-12-14: 296 mL via ORAL
  Filled 2020-12-14: qty 296

## 2020-12-14 MED ORDER — ALUM & MAG HYDROXIDE-SIMETH 200-200-20 MG/5ML PO SUSP: 30.0000 mL | ORAL | Status: DC | PRN

## 2020-12-14 MED ORDER — FENTANYL CITRATE (PF) 100 MCG/2ML IJ SOLN
INTRAMUSCULAR | Status: AC
  Filled 2020-12-14: qty 2

## 2020-12-14 MED ORDER — TRANEXAMIC ACID-NACL 1000-0.7 MG/100ML-% IV SOLN: 1000.0000 mg | Freq: Once | INTRAVENOUS | Status: AC

## 2020-12-14 MED ORDER — FENTANYL CITRATE (PF) 100 MCG/2ML IJ SOLN: 25.0000 ug | INTRAMUSCULAR | Status: DC | PRN

## 2020-12-14 MED ORDER — SODIUM CHLORIDE 0.9 % IR SOLN
Status: DC | PRN
  Administered 2020-12-14: 2400 mL

## 2020-12-14 MED ORDER — SODIUM CHLORIDE 0.9 % IV SOLN
INTRAVENOUS | Status: DC | PRN
  Administered 2020-12-14: 60 mL

## 2020-12-14 MED ORDER — SENNOSIDES-DOCUSATE SODIUM 8.6-50 MG PO TABS
1.0000 | ORAL_TABLET | Freq: Two times a day (BID) | ORAL | Status: DC
  Administered 2020-12-14 – 2020-12-15 (×2): 1 via ORAL
  Filled 2020-12-14 (×2): qty 1

## 2020-12-14 MED ORDER — MIDAZOLAM HCL 5 MG/5ML IJ SOLN
INTRAMUSCULAR | Status: DC | PRN
Start: 2020-12-14 — End: 2020-12-14
  Administered 2020-12-14: 2 mg via INTRAVENOUS

## 2020-12-14 MED ORDER — BUSPIRONE HCL 10 MG PO TABS
5.0000 mg | ORAL_TABLET | Freq: Every day | ORAL | Status: DC
  Administered 2020-12-15: 5 mg via ORAL
  Filled 2020-12-14: qty 1

## 2020-12-14 MED ORDER — OXYCODONE HCL 5 MG PO TABS
5.0000 mg | ORAL_TABLET | ORAL | Status: DC | PRN
  Administered 2020-12-14 – 2020-12-15 (×3): 5 mg via ORAL
  Filled 2020-12-14 (×3): qty 1

## 2020-12-14 MED ORDER — GABAPENTIN 300 MG PO CAPS
ORAL_CAPSULE | ORAL | Status: AC
  Filled 2020-12-14: qty 1

## 2020-12-14 MED ORDER — CEFAZOLIN SODIUM-DEXTROSE 2-4 GM/100ML-% IV SOLN: 2.0000 g | INTRAVENOUS | Status: DC

## 2020-12-14 MED ORDER — ACETAMINOPHEN 10 MG/ML IV SOLN: 1000.0000 mg | Freq: Four times a day (QID) | INTRAVENOUS | Status: DC

## 2020-12-14 MED ORDER — CELECOXIB 200 MG PO CAPS: 400.0000 mg | ORAL_CAPSULE | Freq: Once | ORAL | Status: DC

## 2020-12-14 MED ORDER — CITALOPRAM HYDROBROMIDE 20 MG PO TABS
20.0000 mg | ORAL_TABLET | Freq: Every day | ORAL | Status: DC
  Administered 2020-12-14 – 2020-12-15 (×2): 20 mg via ORAL
  Filled 2020-12-14 (×2): qty 1

## 2020-12-14 MED ORDER — POLYVINYL ALCOHOL 1.4 % OP SOLN
1.0000 [drp] | Freq: Three times a day (TID) | OPHTHALMIC | Status: DC | PRN
  Filled 2020-12-14: qty 15

## 2020-12-14 MED ORDER — ACETAMINOPHEN 10 MG/ML IV SOLN: 1000.0000 mg | Freq: Once | INTRAVENOUS | Status: DC | PRN

## 2020-12-14 MED ORDER — TRANEXAMIC ACID-NACL 1000-0.7 MG/100ML-% IV SOLN
INTRAVENOUS | Status: AC
  Filled 2020-12-14: qty 100

## 2020-12-14 MED ORDER — DIPHENHYDRAMINE HCL 12.5 MG/5ML PO ELIX: 12.5000 mg | ORAL_SOLUTION | ORAL | Status: DC | PRN

## 2020-12-14 MED ORDER — SURGIPHOR WOUND IRRIGATION SYSTEM - OPTIME
TOPICAL | Status: DC | PRN
  Administered 2020-12-14: 450 mL

## 2020-12-14 MED ORDER — ORAL CARE MOUTH RINSE: 15.0000 mL | Freq: Once | OROMUCOSAL | Status: DC

## 2020-12-14 MED ORDER — TRANEXAMIC ACID-NACL 1000-0.7 MG/100ML-% IV SOLN
INTRAVENOUS | Status: DC | PRN
  Administered 2020-12-14: 1000 mg via INTRAVENOUS

## 2020-12-14 MED ORDER — ACETAMINOPHEN 10 MG/ML IV SOLN
INTRAVENOUS | Status: DC | PRN
  Administered 2020-12-14: 1000 mg via INTRAVENOUS

## 2020-12-14 MED ORDER — TRAMADOL HCL 50 MG PO TABS: 50.0000 mg | ORAL_TABLET | ORAL | Status: DC | PRN

## 2020-12-14 MED ORDER — OXYCODONE HCL 5 MG PO TABS
10.0000 mg | ORAL_TABLET | ORAL | Status: DC | PRN
  Administered 2020-12-15 (×2): 10 mg via ORAL
  Filled 2020-12-14 (×2): qty 2

## 2020-12-14 MED ORDER — METOPROLOL SUCCINATE ER 25 MG PO TB24
12.5000 mg | ORAL_TABLET | Freq: Every day | ORAL | Status: DC
  Administered 2020-12-15: 12.5 mg via ORAL
  Filled 2020-12-14: qty 1

## 2020-12-14 MED ORDER — BISACODYL 10 MG RE SUPP: 10.0000 mg | Freq: Every day | RECTAL | Status: DC | PRN

## 2020-12-14 MED ORDER — FERROUS SULFATE 325 (65 FE) MG PO TABS
325.0000 mg | ORAL_TABLET | Freq: Two times a day (BID) | ORAL | Status: DC
  Administered 2020-12-15: 325 mg via ORAL
  Filled 2020-12-14: qty 1

## 2020-12-14 MED ORDER — CEFAZOLIN SODIUM-DEXTROSE 2-3 GM-%(50ML) IV SOLR
INTRAVENOUS | Status: DC | PRN
  Administered 2020-12-14: 2 g via INTRAVENOUS

## 2020-12-14 MED ORDER — CELECOXIB 200 MG PO CAPS
ORAL_CAPSULE | ORAL | Status: AC
  Administered 2020-12-14: 400 mg via ORAL
  Filled 2020-12-14: qty 2

## 2020-12-14 MED ORDER — FUROSEMIDE 20 MG PO TABS: 20.0000 mg | ORAL_TABLET | Freq: Every day | ORAL | Status: DC | PRN

## 2020-12-14 MED ORDER — SODIUM CHLORIDE 0.9 % IV SOLN: INTRAVENOUS | Status: DC

## 2020-12-14 MED ORDER — PHENOL 1.4 % MT LIQD
1.0000 | OROMUCOSAL | Status: DC | PRN
  Filled 2020-12-14 (×2): qty 177

## 2020-12-14 MED ORDER — EZETIMIBE 10 MG PO TABS
10.0000 mg | ORAL_TABLET | Freq: Every day | ORAL | Status: DC
  Administered 2020-12-14 – 2020-12-15 (×2): 10 mg via ORAL
  Filled 2020-12-14 (×2): qty 1

## 2020-12-14 MED ORDER — TRANEXAMIC ACID-NACL 1000-0.7 MG/100ML-% IV SOLN: 1000.0000 mg | INTRAVENOUS | Status: DC

## 2020-12-14 MED ORDER — GABAPENTIN 300 MG PO CAPS: 300.0000 mg | ORAL_CAPSULE | Freq: Once | ORAL | Status: DC

## 2020-12-14 MED ORDER — ONDANSETRON HCL 4 MG PO TABS: 4.0000 mg | ORAL_TABLET | Freq: Four times a day (QID) | ORAL | Status: DC | PRN

## 2020-12-14 MED ORDER — BUPIVACAINE HCL (PF) 0.25 % IJ SOLN
INTRAMUSCULAR | Status: DC | PRN
  Administered 2020-12-14: 60 mL

## 2020-12-14 MED ORDER — CHLORHEXIDINE GLUCONATE 4 % EX LIQD: 60.0000 mL | Freq: Once | CUTANEOUS | Status: DC

## 2020-12-14 MED ORDER — DEXAMETHASONE SODIUM PHOSPHATE 10 MG/ML IJ SOLN: 8.0000 mg | Freq: Once | INTRAMUSCULAR | Status: DC

## 2020-12-14 MED ORDER — CHLORHEXIDINE GLUCONATE 0.12 % MT SOLN: 15.0000 mL | Freq: Once | OROMUCOSAL | Status: DC

## 2020-12-14 MED ORDER — MAGNESIUM HYDROXIDE 400 MG/5ML PO SUSP
30.0000 mL | Freq: Every day | ORAL | Status: DC
  Administered 2020-12-14 – 2020-12-15 (×2): 30 mL via ORAL
  Filled 2020-12-14 (×2): qty 30

## 2020-12-14 MED ORDER — BUPIVACAINE HCL (PF) 0.5 % IJ SOLN
INTRAMUSCULAR | Status: DC | PRN
  Administered 2020-12-14: 3 mL

## 2020-12-14 MED ORDER — ONDANSETRON HCL 4 MG/2ML IJ SOLN: 4.0000 mg | Freq: Four times a day (QID) | INTRAMUSCULAR | Status: DC | PRN

## 2020-12-14 MED ORDER — DEXAMETHASONE SODIUM PHOSPHATE 10 MG/ML IJ SOLN
INTRAMUSCULAR | Status: AC
  Administered 2020-12-14: 8 mg via INTRAVENOUS
  Filled 2020-12-14: qty 1

## 2020-12-14 MED ORDER — ENOXAPARIN SODIUM 30 MG/0.3ML IJ SOSY
30.0000 mg | PREFILLED_SYRINGE | Freq: Two times a day (BID) | INTRAMUSCULAR | Status: DC
  Administered 2020-12-15: 30 mg via SUBCUTANEOUS
  Filled 2020-12-14: qty 0.3

## 2020-12-14 MED ORDER — CELECOXIB 200 MG PO CAPS
200.0000 mg | ORAL_CAPSULE | Freq: Two times a day (BID) | ORAL | Status: DC
  Administered 2020-12-14 – 2020-12-15 (×2): 200 mg via ORAL
  Filled 2020-12-14 (×2): qty 1

## 2020-12-14 MED ORDER — LACTATED RINGERS IV SOLN: INTRAVENOUS | Status: DC

## 2020-12-14 MED ORDER — CHLORHEXIDINE GLUCONATE 0.12 % MT SOLN
OROMUCOSAL | Status: AC
  Administered 2020-12-14: 15 mL via OROMUCOSAL
  Filled 2020-12-14: qty 15

## 2020-12-14 MED ORDER — CEFAZOLIN SODIUM-DEXTROSE 2-4 GM/100ML-% IV SOLN
INTRAVENOUS | Status: AC
  Filled 2020-12-14: qty 100

## 2020-12-14 MED ORDER — CEFAZOLIN SODIUM-DEXTROSE 2-4 GM/100ML-% IV SOLN
2.0000 g | Freq: Four times a day (QID) | INTRAVENOUS | Status: AC
Start: 2020-12-14 — End: 2020-12-15
  Administered 2020-12-14 – 2020-12-15 (×2): 2 g via INTRAVENOUS
  Filled 2020-12-14 (×2): qty 100

## 2020-12-14 MED ORDER — MIDAZOLAM HCL 2 MG/2ML IJ SOLN
INTRAMUSCULAR | Status: AC
  Filled 2020-12-14: qty 2

## 2020-12-14 MED ORDER — ACETAMINOPHEN 10 MG/ML IV SOLN
INTRAVENOUS | Status: AC
  Filled 2020-12-14: qty 100

## 2020-12-14 MED ORDER — ACETAMINOPHEN 500 MG PO TABS
1000.0000 mg | ORAL_TABLET | Freq: Four times a day (QID) | ORAL | Status: AC
  Administered 2020-12-14 – 2020-12-15 (×4): 1000 mg via ORAL
  Filled 2020-12-14 (×4): qty 2

## 2020-12-14 MED ORDER — METOCLOPRAMIDE HCL 10 MG PO TABS
10.0000 mg | ORAL_TABLET | Freq: Three times a day (TID) | ORAL | Status: DC
  Administered 2020-12-14 – 2020-12-15 (×2): 10 mg via ORAL
  Filled 2020-12-14 (×2): qty 1

## 2020-12-14 MED ORDER — FLEET ENEMA 7-19 GM/118ML RE ENEM: 1.0000 | ENEMA | Freq: Once | RECTAL | Status: DC | PRN

## 2020-12-14 MED ORDER — ALBUTEROL SULFATE (2.5 MG/3ML) 0.083% IN NEBU: 3.0000 mL | INHALATION_SOLUTION | Freq: Four times a day (QID) | RESPIRATORY_TRACT | Status: DC | PRN

## 2020-12-14 MED ORDER — ACETAMINOPHEN 325 MG PO TABS: 325.0000 mg | ORAL_TABLET | Freq: Four times a day (QID) | ORAL | Status: DC | PRN

## 2020-12-14 MED ORDER — PANTOPRAZOLE SODIUM 40 MG PO TBEC
40.0000 mg | DELAYED_RELEASE_TABLET | Freq: Two times a day (BID) | ORAL | Status: DC
  Administered 2020-12-14 – 2020-12-15 (×2): 40 mg via ORAL
  Filled 2020-12-14 (×2): qty 1

## 2020-12-14 MED ORDER — TRANEXAMIC ACID-NACL 1000-0.7 MG/100ML-% IV SOLN
INTRAVENOUS | Status: AC
  Administered 2020-12-14: 1000 mg via INTRAVENOUS
  Filled 2020-12-14: qty 100

## 2020-12-14 MED ORDER — LORATADINE 10 MG PO TABS
10.0000 mg | ORAL_TABLET | Freq: Every day | ORAL | Status: DC
  Administered 2020-12-15: 10 mg via ORAL
  Filled 2020-12-14: qty 1

## 2020-12-14 MED ORDER — SODIUM CHLORIDE 0.9 % IV SOLN
INTRAVENOUS | Status: DC | PRN
  Administered 2020-12-14: 50 ug/min via INTRAVENOUS

## 2020-12-14 MED ORDER — PROPOFOL 500 MG/50ML IV EMUL
INTRAVENOUS | Status: DC | PRN
  Administered 2020-12-14: 150 ug/kg/min via INTRAVENOUS

## 2020-12-14 MED ORDER — ONDANSETRON HCL 4 MG/2ML IJ SOLN: 4.0000 mg | Freq: Once | INTRAMUSCULAR | Status: DC | PRN

## 2020-12-14 MED ORDER — EPINEPHRINE 0.3 MG/0.3ML IJ SOAJ
0.3000 mg | INTRAMUSCULAR | Status: DC | PRN
  Filled 2020-12-14: qty 0.3

## 2020-12-14 SURGICAL SUPPLY — 76 items
ATTUNE PS FEM RT SZ 4 CEM KNEE (Femur) ×1 IMPLANT
ATTUNE PSRP INSR SZ4 6 KNEE (Insert) ×1 IMPLANT
BASEPLATE TIBIAL ROTATING SZ 4 (Knees) ×1 IMPLANT
BATTERY INSTRU NAVIGATION (MISCELLANEOUS) ×8 IMPLANT
BLADE SAW 70X12.5 (BLADE) ×2 IMPLANT
BLADE SAW 90X13X1.19 OSCILLAT (BLADE) ×2 IMPLANT
BLADE SAW 90X25X1.19 OSCILLAT (BLADE) ×2 IMPLANT
BONE CEMENT GENTAMICIN (Cement) ×4 IMPLANT
CEMENT BONE GENTAMICIN 40 (Cement) IMPLANT
COOLER POLAR GLACIER W/PUMP (MISCELLANEOUS) ×2 IMPLANT
CUFF TOURN SGL QUICK 24 (TOURNIQUET CUFF)
CUFF TOURN SGL QUICK 34 (TOURNIQUET CUFF)
CUFF TRNQT CYL 24X4X16.5-23 (TOURNIQUET CUFF) IMPLANT
CUFF TRNQT CYL 34X4.125X (TOURNIQUET CUFF) IMPLANT
DRAPE 3/4 80X56 (DRAPES) ×2 IMPLANT
DRAPE INCISE IOBAN 66X45 STRL (DRAPES) ×2 IMPLANT
DRSG DERMACEA 8X12 NADH (GAUZE/BANDAGES/DRESSINGS) ×2 IMPLANT
DRSG MEPILEX SACRM 8.7X9.8 (GAUZE/BANDAGES/DRESSINGS) ×2 IMPLANT
DRSG OPSITE POSTOP 4X14 (GAUZE/BANDAGES/DRESSINGS) ×2 IMPLANT
DRSG TEGADERM 4X4.75 (GAUZE/BANDAGES/DRESSINGS) ×2 IMPLANT
DURAPREP 26ML APPLICATOR (WOUND CARE) ×4 IMPLANT
ELECT CAUTERY BLADE 6.4 (BLADE) ×2 IMPLANT
ELECT COATED BLADE 2.86 ST (ELECTRODE) ×1 IMPLANT
ELECT REM PT RETURN 9FT ADLT (ELECTROSURGICAL) ×2
ELECTRODE REM PT RTRN 9FT ADLT (ELECTROSURGICAL) ×1 IMPLANT
EX-PIN ORTHOLOCK NAV 4X150 (PIN) ×4 IMPLANT
GLOVE SRG 8 PF TXTR STRL LF DI (GLOVE) ×1 IMPLANT
GLOVE SURG ENC TEXT LTX SZ7.5 (GLOVE) ×4 IMPLANT
GLOVE SURG UNDER POLY LF SZ7.5 (GLOVE) ×8 IMPLANT
GLOVE SURG UNDER POLY LF SZ8 (GLOVE) ×1
GOWN STRL REUS W/ TWL LRG LVL3 (GOWN DISPOSABLE) ×2 IMPLANT
GOWN STRL REUS W/ TWL XL LVL3 (GOWN DISPOSABLE) ×1 IMPLANT
GOWN STRL REUS W/TWL LRG LVL3 (GOWN DISPOSABLE) ×2
GOWN STRL REUS W/TWL XL LVL3 (GOWN DISPOSABLE) ×1
HEMOVAC 400CC 10FR (MISCELLANEOUS) ×2 IMPLANT
HOLDER FOLEY CATH W/STRAP (MISCELLANEOUS) ×2 IMPLANT
IRRIGATION SURGIPHOR STRL (IV SOLUTION) ×2 IMPLANT
IV NS IRRIG 3000ML ARTHROMATIC (IV SOLUTION) ×2 IMPLANT
KIT TURNOVER KIT A (KITS) ×2 IMPLANT
KNIFE SCULPS 14X20 (INSTRUMENTS) ×2 IMPLANT
LABEL OR SOLS (LABEL) ×2 IMPLANT
MANIFOLD NEPTUNE II (INSTRUMENTS) ×4 IMPLANT
NDL SAFETY ECLIPSE 18X1.5 (NEEDLE) ×1 IMPLANT
NDL SPNL 20GX3.5 QUINCKE YW (NEEDLE) ×2 IMPLANT
NEEDLE HYPO 18GX1.5 SHARP (NEEDLE) ×1
NEEDLE SPNL 20GX3.5 QUINCKE YW (NEEDLE) ×4 IMPLANT
NS IRRIG 500ML POUR BTL (IV SOLUTION) ×2 IMPLANT
PACK TOTAL KNEE (MISCELLANEOUS) ×2 IMPLANT
PAD ABD DERMACEA PRESS 5X9 (GAUZE/BANDAGES/DRESSINGS) ×4 IMPLANT
PAD WRAPON POLAR KNEE (MISCELLANEOUS) ×1 IMPLANT
PATELLA MEDIAL ATTUN 35MM KNEE (Knees) ×1 IMPLANT
PENCIL SMOKE EVACUATOR COATED (MISCELLANEOUS) ×2 IMPLANT
PIN DRILL FIX HALF THREAD (BIT) ×4 IMPLANT
PIN DRILL QUICK PACK ×3 IMPLANT
PIN FIXATION 1/8DIA X 3INL (PIN) ×2 IMPLANT
PULSAVAC PLUS IRRIG FAN TIP (DISPOSABLE) ×2
SOL PREP PVP 2OZ (MISCELLANEOUS) ×2
SOLUTION PREP PVP 2OZ (MISCELLANEOUS) ×1 IMPLANT
SPONGE DRAIN TRACH 4X4 STRL 2S (GAUZE/BANDAGES/DRESSINGS) ×2 IMPLANT
SPONGE T-LAP 18X18 ~~LOC~~+RFID (SPONGE) ×6 IMPLANT
STAPLER SKIN PROX 35W (STAPLE) ×2 IMPLANT
STOCKINETTE IMPERV 14X48 (MISCELLANEOUS) ×1 IMPLANT
STRAP TIBIA SHORT (MISCELLANEOUS) ×2 IMPLANT
SUCTION FRAZIER HANDLE 10FR (MISCELLANEOUS) ×1
SUCTION TUBE FRAZIER 10FR DISP (MISCELLANEOUS) ×1 IMPLANT
SUT VIC AB 0 CT1 36 (SUTURE) ×4 IMPLANT
SUT VIC AB 1 CT1 36 (SUTURE) ×4 IMPLANT
SUT VIC AB 2-0 CT2 27 (SUTURE) ×2 IMPLANT
SYR 20ML LL LF (SYRINGE) ×2 IMPLANT
SYR 30ML LL (SYRINGE) ×4 IMPLANT
TIP FAN IRRIG PULSAVAC PLUS (DISPOSABLE) ×1 IMPLANT
TOWEL OR 17X26 4PK STRL BLUE (TOWEL DISPOSABLE) ×2 IMPLANT
TOWER CARTRIDGE SMART MIX (DISPOSABLE) ×2 IMPLANT
TRAY FOLEY MTR SLVR 16FR STAT (SET/KITS/TRAYS/PACK) ×2 IMPLANT
WATER STERILE IRR 500ML POUR (IV SOLUTION) ×2 IMPLANT
WRAPON POLAR PAD KNEE (MISCELLANEOUS) ×2

## 2020-12-14 NOTE — Progress Notes (Signed)
Informed Dr Marry Guan of patient's refusal to take Gabapentin as ordered, adverse reaction reported in past.  Added to Patient record.

## 2020-12-14 NOTE — Anesthesia Procedure Notes (Signed)
Spinal  Patient location during procedure: OR Start time: 12/14/2020 11:35 AM Reason for block: surgical anesthesia Staffing Performed: resident/CRNA  Resident/CRNA: Nelda Marseille, CRNA Preanesthetic Checklist Completed: patient identified, IV checked, site marked, risks and benefits discussed, surgical consent, monitors and equipment checked, pre-op evaluation and timeout performed Spinal Block Patient position: sitting Prep: Betadine Patient monitoring: heart rate, continuous pulse ox, blood pressure and cardiac monitor Approach: midline Location: L3-4 Injection technique: single-shot Needle Needle type: Whitacre and Introducer  Needle gauge: 25 G Needle length: 9 cm Assessment Events: CSF return Additional Notes Negative paresthesia. Negative blood return. Positive free-flowing CSF. Expiration date of kit checked and confirmed. Patient tolerated procedure well, without complications.

## 2020-12-14 NOTE — Anesthesia Preprocedure Evaluation (Deleted)
Anesthesia Evaluation    Airway        Dental   Pulmonary           Cardiovascular hypertension, + Valvular Problems/Murmurs MR   TTE 2021: 1. Left ventricular ejection fraction, by estimation, is 60 to 65%. The  left ventricle has normal function. The left ventricle has no regional  wall motion abnormalities. Left ventricular diastolic parameters are  consistent with Grade II diastolic  dysfunction (pseudonormalization).  2. Right ventricular systolic function is normal. The right ventricular  size is normal. There is mildly elevated pulmonary artery systolic  pressure.  3. The mitral valve is normal in structure. Mild to moderate mitral valve  regurgitation.  4. The inferior vena cava is dilated in size with <50% respiratory  variability, suggesting right atrial pressure of 15 mmHg.    Neuro/Psych    GI/Hepatic   Endo/Other    Renal/GU      Musculoskeletal   Abdominal   Peds  Hematology   Anesthesia Other Findings   Reproductive/Obstetrics                                                              Anesthesia Evaluation  Patient identified by MRN, date of birth, ID band Patient awake    Reviewed: Allergy & Precautions, NPO status , Patient's Chart, lab work & pertinent test results  History of Anesthesia Complications Negative for: history of anesthetic complications  Airway Mallampati: II  TM Distance: >3 FB Neck ROM: Full    Dental no notable dental hx. (+) Teeth Intact   Pulmonary asthma , neg sleep apnea, neg COPD, Patient abstained from smoking.Not current smoker,  Patient with chronic cough and vocal cord dysfunction; says she has received steroid injections in the neck to help with this, as well as endoscopies for evaluation. She was told the endoscopy was difficult because of her frequent coughing.   Pulmonary exam normal breath sounds clear to  auscultation       Cardiovascular Exercise Tolerance: Good METShypertension, pulmonary hypertension(-) CAD and (-) Past MI (-) dysrhythmias + Valvular Problems/Murmurs MR  Rhythm:Regular Rate:Normal - Systolic murmurs TTE 2778: 1. Left ventricular ejection fraction, by estimation, is 60 to 65%. The  left ventricle has normal function. The left ventricle has no regional  wall motion abnormalities. Left ventricular diastolic parameters are  consistent with Grade II diastolic  dysfunction (pseudonormalization).  2. Right ventricular systolic function is normal. The right ventricular  size is normal. There is mildly elevated pulmonary artery systolic  pressure.  3. The mitral valve is normal in structure. Mild to moderate mitral valve  regurgitation.  4. The inferior vena cava is dilated in size with <50% respiratory  variability, suggesting right atrial pressure of 15 mmHg.    Neuro/Psych PSYCHIATRIC DISORDERS Anxiety Depression Seizure 2/2 compazine in the past  Neuromuscular disease    GI/Hepatic hiatal hernia, GERD  Medicated,(+)     (-) substance abuse  ,   Endo/Other  neg diabetes  Renal/GU negative Renal ROS     Musculoskeletal  (+) Arthritis , Osteoarthritis,    Abdominal   Peds  Hematology   Anesthesia Other Findings Past Medical History: No date: (HFpEF) heart failure with preserved ejection fraction (HCC) No date: Anxiety No date: Asthma No date: Depression  No date: Fibromyalgia No date: GERD (gastroesophageal reflux disease) No date: Grade II diastolic dysfunction No date: H/O scarlet fever     Comment:  as child No date: Heart murmur     Comment:  s/p scarlet fever as child No date: HLD (hyperlipidemia) No date: HTN (hypertension) No date: IBS (irritable bowel syndrome) No date: Low bone density No date: Mitral regurgitation No date: OA (osteoarthritis) No date: Osteopenia No date: Palpitations No date: PHN (postherpetic neuralgia) No  date: PSVT (paroxysmal supraventricular tachycardia) (HCC) No date: Recurrent syncope No date: Rosacea 1978: Seizure ([place])     Comment:  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 No date: Wears hearing aid in both ears  Reproductive/Obstetrics                            Anesthesia Physical Anesthesia Plan  ASA: 2  Anesthesia Plan: General   Post-op Pain Management:    Induction: Intravenous  PONV Risk Score and Plan: 3 and Ondansetron, Dexamethasone and Treatment may vary due to age or medical condition  Airway Management Planned: Oral ETT  Additional Equipment: None  Intra-op Plan:   Post-operative Plan: Extubation in OR  Informed Consent: I have reviewed the patients History and Physical, chart, labs and discussed the procedure including the risks, benefits and alternatives for the proposed anesthesia with the patient or authorized representative who has indicated his/her understanding and acceptance.     Dental advisory given  Plan Discussed with: CRNA and Surgeon  Anesthesia Plan Comments: (Given patient's history of vocal cord issues and chronic cough, electing to proceed with GETA rather than LMA. Discussed risks of anesthesia with patient, including PONV, sore throat, lip/dental/vocal cord damage. Rare risks discussed as well, such as cardiorespiratory and neurological sequelae, and allergic reactions. Patient understands.)        Anesthesia Quick Evaluation  Anesthesia Physical Anesthesia Plan Anesthesia Quick Evaluation

## 2020-12-14 NOTE — Anesthesia Preprocedure Evaluation (Signed)
Anesthesia Evaluation  Patient identified by MRN, date of birth, ID band Patient awake    Reviewed: Allergy & Precautions, NPO status , Patient's Chart, lab work & pertinent test results  History of Anesthesia Complications Negative for: history of anesthetic complications  Airway Mallampati: II  TM Distance: >3 FB Neck ROM: Full    Dental no notable dental hx. (+) Teeth Intact   Pulmonary asthma , neg sleep apnea, neg COPD, Patient abstained from smoking.Not current smoker,  Patient with chronic cough and vocal cord dysfunction; says she has received steroid injections in the neck to help with this, as well as endoscopies for evaluation. She was told the endoscopy was difficult because of her frequent coughing. She has had natural airway cases (colonoscopies) before without issue. Her chronic cough today is at her baseline   Pulmonary exam normal breath sounds clear to auscultation       Cardiovascular Exercise Tolerance: Good METShypertension, pulmonary hypertension(-) CAD and (-) Past MI (-) dysrhythmias + Valvular Problems/Murmurs MR  Rhythm:Regular Rate:Normal - Systolic murmurs TTE 9678: 1. Left ventricular ejection fraction, by estimation, is 60 to 65%. The  left ventricle has normal function. The left ventricle has no regional  wall motion abnormalities. Left ventricular diastolic parameters are  consistent with Grade II diastolic  dysfunction (pseudonormalization).  2. Right ventricular systolic function is normal. The right ventricular  size is normal. There is mildly elevated pulmonary artery systolic  pressure.  3. The mitral valve is normal in structure. Mild to moderate mitral valve  regurgitation.  4. The inferior vena cava is dilated in size with <50% respiratory  variability, suggesting right atrial pressure of 15 mmHg.    Neuro/Psych PSYCHIATRIC DISORDERS Anxiety Depression Seizure 2/2 compazine in the past   Neuromuscular disease    GI/Hepatic hiatal hernia, GERD  Medicated,(+)     (-) substance abuse  ,   Endo/Other  neg diabetes  Renal/GU negative Renal ROS     Musculoskeletal  (+) Arthritis , Osteoarthritis,    Abdominal   Peds  Hematology   Anesthesia Other Findings Past Medical History: No date: (HFpEF) heart failure with preserved ejection fraction (HCC) No date: Anxiety No date: Asthma No date: Depression No date: Fibromyalgia No date: GERD (gastroesophageal reflux disease) No date: Grade II diastolic dysfunction No date: H/O scarlet fever     Comment:  as child No date: Heart murmur     Comment:  s/p scarlet fever as child No date: HLD (hyperlipidemia) No date: HTN (hypertension) No date: IBS (irritable bowel syndrome) No date: Low bone density No date: Mitral regurgitation No date: OA (osteoarthritis) No date: Osteopenia No date: Palpitations No date: PHN (postherpetic neuralgia) No date: PSVT (paroxysmal supraventricular tachycardia) (HCC) No date: Recurrent syncope No date: Rosacea 1978: Seizure ([place])     Comment:  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 No date: Wears hearing aid in both ears  Reproductive/Obstetrics                             Anesthesia Physical  Anesthesia Plan  ASA: 2  Anesthesia Plan: Spinal   Post-op Pain Management:    Induction: Intravenous  PONV Risk Score and Plan: 3 and Ondansetron, Dexamethasone and Treatment may vary due to age or medical condition  Airway Management Planned: Oral ETT  Additional Equipment: None  Intra-op Plan:   Post-operative Plan: Extubation in OR  Informed Consent: I have reviewed the patients History and Physical, chart,  labs and discussed the procedure including the risks, benefits and alternatives for the proposed anesthesia with the patient or authorized representative who has indicated his/her understanding and acceptance.     Dental advisory given  Plan  Discussed with: CRNA and Surgeon  Anesthesia Plan Comments: (Discussed R/B/A of neuraxial anesthesia technique with patient: - rare risks of spinal/epidural hematoma, nerve damage, infection - Risk of PDPH - Risk of nausea and vomiting - Risk of conversion to general anesthesia and its associated risks, including sore throat, damage to lips/teeth/oropharynx, and rare risks such as cardiac and respiratory events. - Risk of allergic reactions I still believe the potential benefits of performing a spinal (pain control, avoiding trauma to vocal cords with an ETT) outweigh the risks of laryngospasm given this patient's vocal cord dysfunction. Patient understands there may be a higher chance of converting to GETA if any aspiration or laryngospasm.  Patient voiced understanding.)        Anesthesia Quick Evaluation

## 2020-12-14 NOTE — Transfer of Care (Signed)
Immediate Anesthesia Transfer of Care Note  Patient: Andrea Savage  Procedure(s) Performed: COMPUTER ASSISTED TOTAL KNEE ARTHROPLASTY (Right: Knee)  Patient Location: PACU  Anesthesia Type:Spinal  Level of Consciousness: awake, alert  and oriented  Airway & Oxygen Therapy: Patient Spontanous Breathing and Patient connected to face mask oxygen  Post-op Assessment: Report given to RN and Post -op Vital signs reviewed and stable  Post vital signs: Reviewed and stable  Last Vitals:  Vitals Value Taken Time  BP    Temp    Pulse    Resp    SpO2      Last Pain:  Vitals:   12/14/20 0957  TempSrc: Tympanic  PainSc: 0-No pain      Patients Stated Pain Goal: 0 (24/81/85 9093)  Complications: No notable events documented.

## 2020-12-14 NOTE — H&P (Signed)
The patient has been re-examined, and the chart reviewed, and there have been no interval changes to the documented history and physical.    The risks, benefits, and alternatives have been discussed at length. The patient expressed understanding of the risks benefits and agreed with plans for surgical intervention.  Darsha Zumstein P. Piya Mesch, Jr. M.D.    

## 2020-12-14 NOTE — Op Note (Signed)
OPERATIVE NOTE  DATE OF SURGERY:  12/14/2020  PATIENT NAME:  Andrea Savage   DOB: January 16, 1946  MRN: [id number]  PRE-OPERATIVE DIAGNOSIS: Degenerative arthrosis of the right knee, primary  POST-OPERATIVE DIAGNOSIS:  Same  PROCEDURE:  Right total knee arthroplasty using computer-assisted navigation  SURGEON:  Marciano Sequin. M.D.  ASSISTANT: Cassell Smiles, PA-C (present and scrubbed throughout the case, critical for assistance with exposure, retraction, instrumentation, and closure)  ANESTHESIA: spinal  ESTIMATED BLOOD LOSS: 50 mL  FLUIDS REPLACED: 1500 mL of crystalloid  TOURNIQUET TIME: 78 minutes  DRAINS: 2 medium Hemovac drains  SOFT TISSUE RELEASES: Anterior cruciate ligament, posterior cruciate ligament, deep medial collateral ligament, patellofemoral ligament  IMPLANTS UTILIZED: DePuy Attune size 4 posterior stabilized femoral component (cemented), size 4 rotating platform tibial component (cemented), 35 mm medialized dome patella (cemented), and a 6 mm stabilized rotating platform polyethylene insert.  INDICATIONS FOR SURGERY: JAIONA Savage is a 75 y.o. year old female with a long history of progressive knee pain. X-rays demonstrated severe degenerative changes in tricompartmental fashion. The patient had not seen any significant improvement despite conservative nonsurgical intervention. After discussion of the risks and benefits of surgical intervention, the patient expressed understanding of the risks benefits and agree with plans for total knee arthroplasty.   The risks, benefits, and alternatives were discussed at length including but not limited to the risks of infection, bleeding, nerve injury, stiffness, blood clots, the need for revision surgery, cardiopulmonary complications, among others, and they were willing to proceed.  PROCEDURE IN DETAIL: The patient was brought into the operating room and, after adequate spinal anesthesia was achieved, a tourniquet  was placed on the patient's upper thigh. The patient's knee and leg were cleaned and prepped with alcohol and DuraPrep and draped in the usual sterile fashion. A "timeout" was performed as per usual protocol. The lower extremity was exsanguinated using an Esmarch, and the tourniquet was inflated to 300 mmHg. An anterior longitudinal incision was made followed by a standard mid vastus approach. The deep fibers of the medial collateral ligament were elevated in a subperiosteal fashion off of the medial flare of the tibia so as to maintain a continuous soft tissue sleeve. The patella was subluxed laterally and the patellofemoral ligament was incised. Inspection of the knee demonstrated severe degenerative changes with full-thickness loss of articular cartilage. Osteophytes were debrided using a rongeur. Anterior and posterior cruciate ligaments were excised. Two 4.0 mm Schanz pins were inserted in the femur and into the tibia for attachment of the array of trackers used for computer-assisted navigation. Hip center was identified using a circumduction technique. Distal landmarks were mapped using the computer. The distal femur and proximal tibia were mapped using the computer. The distal femoral cutting guide was positioned using computer-assisted navigation so as to achieve a 5 distal valgus cut. The femur was sized and it was felt that a size 4 femoral component was appropriate. A size 4 femoral cutting guide was positioned and the anterior cut was performed and verified using the computer. This was followed by completion of the posterior and chamfer cuts. Femoral cutting guide for the central box was then positioned in the center box cut was performed.  Attention was then directed to the proximal tibia. Medial and lateral menisci were excised. The extramedullary tibial cutting guide was positioned using computer-assisted navigation so as to achieve a 0 varus-valgus alignment and 3 posterior slope. The cut was  performed and verified using the computer. The proximal tibia was  sized and it was felt that a size 4 tibial tray was appropriate. Tibial and femoral trials were inserted followed by insertion of a 6 mm polyethylene insert. This allowed for excellent mediolateral soft tissue balancing both in flexion and in full extension. Finally, the patella was cut and prepared so as to accommodate a 35 mm medialized dome patella. A patella trial was placed and the knee was placed through a range of motion with excellent patellar tracking appreciated. The femoral trial was removed after debridement of posterior osteophytes. The central post-hole for the tibial component was reamed followed by insertion of a keel punch. Tibial trials were then removed. Cut surfaces of bone were irrigated with copious amounts of normal saline using pulsatile lavage and then suctioned dry. Polymethylmethacrylate cement with gentamicin was prepared in the usual fashion using a vacuum mixer. Cement was applied to the cut surface of the proximal tibia as well as along the undersurface of a size 4 rotating platform tibial component. Tibial component was positioned and impacted into place. Excess cement was removed using [employer] fast streamer. Cement was then applied to the cut surfaces of the femur as well as along the posterior flanges of the size 4 femoral component. The femoral component was positioned and impacted into place. Excess cement was removed using [employer] fast streamer. A 6 mm polyethylene trial was inserted and the knee was brought into full extension with steady axial compression applied. Finally, cement was applied to the backside of a 35 mm medialized dome patella and the patellar component was positioned and patellar clamp applied. Excess cement was removed using [employer] fast streamer. After adequate curing of the cement, the tourniquet was deflated after a total tourniquet time of 78 minutes. Hemostasis was achieved using electrocautery. The knee was  irrigated with copious amounts of normal saline using pulsatile lavage followed by 500 ml of Surgiphor and then suctioned dry. 20 mL of 1.3% Exparel and 60 mL of 0.25% Marcaine in 40 mL of normal saline was injected along the posterior capsule, medial and lateral gutters, and along the arthrotomy site. A 6 mm stabilized rotating platform polyethylene insert was inserted and the knee was placed through a range of motion with excellent mediolateral soft tissue balancing appreciated and excellent patellar tracking noted. 2 medium drains were placed in the wound bed and brought out through separate stab incisions. The medial parapatellar portion of the incision was reapproximated using interrupted sutures of #1 Vicryl. Subcutaneous tissue was approximated in layers using first #0 Vicryl followed #2-0 Vicryl. The skin was approximated with skin staples. A sterile dressing was applied.  The patient tolerated the procedure well and was transported to the recovery room in stable condition.    Marieta Markov P. Holley Bouche., M.D.

## 2020-12-15 ENCOUNTER — Encounter: Payer: Self-pay | Admitting: Orthopedic Surgery

## 2020-12-15 DIAGNOSIS — M1711 Unilateral primary osteoarthritis, right knee: Secondary | ICD-10-CM | POA: Diagnosis not present

## 2020-12-15 MED ORDER — CELECOXIB 200 MG PO CAPS: 200.0000 mg | ORAL_CAPSULE | Freq: Two times a day (BID) | ORAL | 0 refills | Status: DC

## 2020-12-15 MED ORDER — TRAMADOL HCL 50 MG PO TABS: 50.0000 mg | ORAL_TABLET | ORAL | 0 refills | Status: DC | PRN

## 2020-12-15 MED ORDER — HYDROCODONE-ACETAMINOPHEN 5-325 MG PO TABS: 1.0000 | ORAL_TABLET | ORAL | 0 refills | Status: DC | PRN

## 2020-12-15 MED ORDER — ENOXAPARIN SODIUM 40 MG/0.4ML IJ SOSY: 40.0000 mg | PREFILLED_SYRINGE | INTRAMUSCULAR | 0 refills | Status: DC

## 2020-12-15 MED ORDER — HYDROCODONE-ACETAMINOPHEN 5-325 MG PO TABS: 1.0000 | ORAL_TABLET | Freq: Four times a day (QID) | ORAL | 0 refills | Status: DC | PRN

## 2020-12-15 NOTE — Evaluation (Signed)
Occupational Therapy Evaluation Patient Details Name: Andrea Savage MRN: [id number] DOB: September 11, 1945 Today's Date: 12/15/2020   History of Present Illness Pt is a 75 yo F diagnosed with degenerative arthrosis of the right knee and is s/p elective R TKA. PMH includes: anxiety, depression, HTN, pulmonary HTN, OA, CHF, hiatal hernia, GERD, and osteopenia.   Clinical Impression   Pt seen for OT evaluation this date, POD#1 from above surgery. Pt was independent in all ADL prior to surgery, however increasingly limited over the past month due to R knee pain. Pt is eager to return to PLOF with less pain and improved safety and independence. Pt currently requires minimal assist for LB dressing and bathing while in seated position due to pain and limited AROM of R knee. Pt/dtr instructed in polar care mgt, falls prevention strategies, home/routines modifications, DME/AE for LB bathing and dressing tasks, and compression stocking mgt. Handout provided to support recall and carryover. Pt would benefit from skilled OT services including additional instruction in dressing techniques with or without assistive devices for dressing and bathing skills to support recall and carryover prior to discharge and ultimately to maximize safety, independence, and minimize falls risk and caregiver burden. Do not currently anticipate any OT needs following this hospitalization.    Recommendations for follow up therapy are one component of a multi-disciplinary discharge planning process, led by the attending physician.  Recommendations may be updated based on patient status, additional functional criteria and insurance authorization.   Follow Up Recommendations  No OT follow up    Equipment Recommendations  3 in 1 bedside commode    Recommendations for Other Services       Precautions / Restrictions Precautions Precautions: Fall Restrictions Weight Bearing Restrictions: Yes RLE Weight Bearing: Weight bearing as  tolerated Other Position/Activity Restrictions: No KI required, pt able to perform Ind RLE SLR's without extensor lag      Mobility Bed Mobility Overal bed mobility: Modified Independent                 Transfers        General transfer comment: deferred, pt just back to bed and took pain meds. Per PT, pt transfering well w/ VC for technique    Balance Overall balance assessment: Needs assistance   Sitting balance-Leahy Scale: Normal                                  ADL either performed or assessed with clinical judgement   ADL Overall ADL's : Needs assistance/impaired                                       General ADL Comments: Pt currently requires MIN A for LB dressing, CGA for ADL transfers, and MOD A for compression stockings and polar care. Family present and able to provide needed level of assist.     Vision         Perception     Praxis      Pertinent Vitals/Pain Pain Assessment: 0-10 Pain Score: 5  Pain Location: R Knee Pain Descriptors / Indicators: Sore Pain Intervention(s): Limited activity within patient's tolerance;Monitored during session;Premedicated before session;Repositioned;Ice applied     Hand Dominance     Extremity/Trunk Assessment Upper Extremity Assessment Upper Extremity Assessment: Overall WFL for tasks assessed   Lower Extremity Assessment Lower Extremity  Assessment: RLE deficits/detail RLE Deficits / Details: s/p TKA RLE: Unable to fully assess due to pain RLE Sensation: WNL RLE Coordination: WNL       Communication Communication Communication: No difficulties   Cognition Arousal/Alertness: Awake/alert Behavior During Therapy: WFL for tasks assessed/performed Overall Cognitive Status: Within Functional Limits for tasks assessed                                     General Comments       Exercises Other Exercises: Pt/family instructed in home/rountines  modifications, falls prevention, pet care considerations, compression stocking mgt, polar care mgt, and AE/DME for ADL. Handout provided   Shoulder Instructions      Home Living Family/patient expects to be discharged to:: Private residence Living Arrangements: Spouse/significant other Available Help at Discharge: Family;Available 24 hours/day Type of Home: House Home Access: Stairs to enter [employer] of Steps: 1 small threshold Entrance Stairs-Rails: None Home Layout: Two level;Able to live on main level with bedroom/bathroom         Bathroom Toilet: Handicapped height     Home Equipment: Walker - 2 wheels;Walker - 4 wheels;Cane - single point;Bedside commode;Hand held shower head;Adaptive equipment Adaptive Equipment: Reacher;Long-handled sponge Additional Comments: the BSC was given to her from a friend, very old      Prior Functioning/Environment Level of Independence: Independent        Comments: Ind amb without an AD community distances, no fall history, Ind with ADLs        OT Problem List: Decreased strength;Decreased range of motion;Pain;Decreased knowledge of use of DME or AE      OT Treatment/Interventions: Self-care/ADL training;Therapeutic exercise;Therapeutic activities;DME and/or AE instruction;Patient/family education;Balance training    OT Goals(Current goals can be found in the care plan section) Acute Rehab OT Goals Patient Stated Goal: To walk better and to travel OT Goal Formulation: With patient/family Time For Goal Achievement: 12/29/20 Potential to Achieve Goals: Good ADL Goals Pt Will Perform Lower Body Dressing: sit to/from stand;with modified independence;with adaptive equipment Pt Will Transfer to Toilet: with modified independence;ambulating (BSC over toilet, LRAD for amb) Additional ADL Goal #1: Pt will independently instruct family in polar care mgt Additional ADL Goal #2: Pt will independently instruct family in compression  stocking mgt  OT Frequency: Min 1X/week   Barriers to D/C:            Co-evaluation              AM-PAC OT "6 Clicks" Daily Activity     Outcome Measure Help from another person eating meals?: None Help from another person taking care of personal grooming?: None Help from another person toileting, which includes using toliet, bedpan, or urinal?: A Little Help from another person bathing (including washing, rinsing, drying)?: A Little Help from another person to put on and taking off regular upper body clothing?: None Help from another person to put on and taking off regular lower body clothing?: A Little 6 Click Score: 21   End of Session    Activity Tolerance: Patient tolerated treatment well Patient left: in bed;with call bell/phone within reach;with bed alarm set;with family/visitor present;with SCD's reapplied;Other (comment) (polar care in place, R foot in bone foam)  OT Visit Diagnosis: Other abnormalities of gait and mobility (R26.89);Pain Pain - Right/Left: Right Pain - part of body: Knee  Time: 4584-8350 OT Time Calculation (min): 24 min Charges:  OT General Charges $OT Visit: 1 Visit OT Evaluation $OT Eval Low Complexity: 1 Low OT Treatments $Self Care/Home Management : 8-22 mins  Ardeth Perfect., MPH, MS, OTR/L ascom [phone] 12/15/20, 1:21 PM

## 2020-12-15 NOTE — Progress Notes (Signed)
Post-op dressing removed. , Hemovac removed., and Mini compression dressing applied.    

## 2020-12-15 NOTE — Evaluation (Signed)
Physical Therapy Evaluation Patient Details Name: Andrea Savage MRN: [id number] DOB: Mar 28, 1945 Today's Date: 12/15/2020  History of Present Illness  Pt is a 75 yo F diagnosed with degenerative arthrosis of the right knee and is s/p elective R TKA. PMH includes: anxiety, depression, HTN, pulmonary HTN, OA, CHF, hiatal hernia, GERD, and osteopenia.   Clinical Impression  Pt was pleasant and motivated to participate during the session and put forth good effort throughout.  Pt required no physical assistance during the session and demonstrated good stability with transfers and gait.  Pt was somewhat antalgic during ambulation and preferred step-to pattern but again was steady with no R knee buckling noted and with no significant increase in baseline pain.  Pt will benefit from HHPT upon discharge to safely address deficits listed in patient problem list for decreased caregiver assistance and eventual return to PLOF.         Recommendations for follow up therapy are one component of a multi-disciplinary discharge planning process, led by the attending physician.  Recommendations may be updated based on patient status, additional functional criteria and insurance authorization.  Follow Up Recommendations Home health PT;Supervision for mobility/OOB    Equipment Recommendations  None recommended by PT    Recommendations for Other Services       Precautions / Restrictions Precautions Precautions: Fall Restrictions Weight Bearing Restrictions: Yes RLE Weight Bearing: Weight bearing as tolerated Other Position/Activity Restrictions: No KI required, pt able to perform Ind RLE SLR's without extensor lag      Mobility  Bed Mobility Overal bed mobility: Modified Independent             General bed mobility comments: Min extra time and effort only    Transfers Overall transfer level: Needs assistance Equipment used: Rolling walker (2 wheeled) Transfers: Sit to/from Stand Sit  to Stand: Min guard         General transfer comment: Mod verbal and visual cues for sequencing with good eccentric and concentric control and stability  Ambulation/Gait Ambulation/Gait assistance: Min guard Gait Distance (Feet): 100 Feet Assistive device: Rolling walker (2 wheeled) Gait Pattern/deviations: Step-to pattern;Decreased stance time - right;Antalgic Gait velocity: decreased   General Gait Details: Mildly antalgic gait pattern on the RLE with step-to gait but steady without LOB or buckling  Stairs            Wheelchair Mobility    Modified Rankin (Stroke Patients Only)       Balance Overall balance assessment: Needs assistance   Sitting balance-Leahy Scale: Normal     Standing balance support: During functional activity;Bilateral upper extremity supported Standing balance-Leahy Scale: Good                               Pertinent Vitals/Pain Pain Assessment: 0-10 Pain Score: 5  Pain Location: R Knee Pain Descriptors / Indicators: Sore Pain Intervention(s): Premedicated before session;Repositioned;Ice applied;Monitored during session    [place] expects to be discharged to:: Private residence Living Arrangements: Spouse/significant other Available Help at Discharge: Family;Available 24 hours/day Type of Home: House Home Access: Stairs to enter Entrance Stairs-Rails: None Entrance Stairs-Number of Steps: 1 small threshold Home Layout: Two level;Able to live on main level with bedroom/bathroom Home Equipment: Gilford Rile - 2 wheels;Walker - 4 wheels;Cane - single point;Bedside commode      Prior Function Level of Independence: Independent         Comments: Ind amb without an AD community  distances, no fall history, Ind with ADLs     Hand Dominance        Extremity/Trunk Assessment   Upper Extremity Assessment Upper Extremity Assessment: Overall WFL for tasks assessed    Lower Extremity Assessment Lower  Extremity Assessment: Generalized weakness;RLE deficits/detail RLE Deficits / Details: R hip flex strength >/= 3/5 RLE: Unable to fully assess due to pain RLE Sensation: WNL RLE Coordination: WNL       Communication   Communication: No difficulties  Cognition Arousal/Alertness: Awake/alert Behavior During Therapy: WFL for tasks assessed/performed Overall Cognitive Status: Within Functional Limits for tasks assessed                                        General Comments      Exercises Total Joint Exercises Ankle Circles/Pumps: AROM;Strengthening;Both;10 reps Quad Sets: AROM;Strengthening;Right;5 reps;10 reps Hip ABduction/ADduction: AROM;Strengthening;Both;5 reps Straight Leg Raises: Both;5 reps;Strengthening;AROM Long Arc Quad: AROM;Strengthening;Right;5 reps;10 reps Knee Flexion: AROM;Strengthening;Right;5 reps;10 reps Goniometric ROM: R knee AROM: grossly 5-80 deg Marching in Standing: AROM;Strengthening;Both;5 reps;Standing Other Exercises Other Exercises: HEP education and review per handout Other Exercises: Positioning education to promote R knee ext PROM and prevent heel pressure provided to pt and daughter Other Exercises: 90 deg R turn training provided to prevent CKC twisting on R knee   Assessment/Plan    PT Assessment Patient needs continued PT services  PT Problem List Decreased strength;Decreased range of motion;Decreased activity tolerance;Decreased balance;Decreased mobility;Decreased knowledge of use of DME;Pain;Decreased knowledge of precautions       PT Treatment Interventions DME instruction;Gait training;Stair training;Functional mobility training;Therapeutic activities;Therapeutic exercise;Balance training;Patient/family education    PT Goals (Current goals can be found in the Care Plan section)  Acute Rehab PT Goals Patient Stated Goal: To walk better and to travel PT Goal Formulation: With patient Time For Goal Achievement:  12/28/20 Potential to Achieve Goals: Good    Frequency BID   Barriers to discharge        Co-evaluation               AM-PAC PT "6 Clicks" Mobility  Outcome Measure Help needed turning from your back to your side while in a flat bed without using bedrails?: A Little Help needed moving from lying on your back to sitting on the side of a flat bed without using bedrails?: A Little Help needed moving to and from a bed to a chair (including a wheelchair)?: A Little Help needed standing up from a chair using your arms (e.g., wheelchair or bedside chair)?: A Little Help needed to walk in hospital room?: A Little Help needed climbing 3-5 steps with a railing? : A Little 6 Click Score: 18    End of Session Equipment Utilized During Treatment: Gait belt Activity Tolerance: Patient tolerated treatment well Patient left: in bed;with call bell/phone within reach;with bed alarm set;with family/visitor present;with SCD's reapplied;Other (comment) (Polar care donned to R knee) Nurse Communication: Mobility status;Weight bearing status PT Visit Diagnosis: Other abnormalities of gait and mobility (R26.89);Muscle weakness (generalized) (M62.81);Pain Pain - Right/Left: Right Pain - part of body: Knee    Time: 8850-2774 PT Time Calculation (min) (ACUTE ONLY): 47 min   Charges:   PT Evaluation $PT Eval Moderate Complexity: 1 Mod PT Treatments $Gait Training: 8-22 mins $Therapeutic Exercise: 8-22 mins       D. Scott Righteous Claiborne PT, DPT 12/15/20, 11:23 AM

## 2020-12-15 NOTE — Progress Notes (Signed)
  Subjective: 1 Day Post-Op Procedure(s) (LRB): COMPUTER ASSISTED TOTAL KNEE ARTHROPLASTY (Right) Patient reports pain as well-controlled.  Daughter at bedside Patient is well, and has had no acute complaints or problems Plan is to go Home after hospital stay. Negative for chest pain and shortness of breath Fever: no Gastrointestinal: negative for nausea and vomiting.  Patient has had a bowel movement.  Objective: Vital signs in last 24 hours: Temp:  [97.3 F (36.3 C)-98.3 F (36.8 C)] 97.8 F (36.6 C) (10/11 0717) Pulse Rate:  [63-82] 63 (10/11 0717) Resp:  [9-18] 16 (10/11 0717) BP: (106-164)/(62-104) 135/67 (10/11 0717) SpO2:  [97 %-100 %] 100 % (10/11 0717) Weight:  [71.4 kg] 71.4 kg (10/10 0957)  Intake/Output from previous day:  Intake/Output Summary (Last 24 hours) at 12/15/2020 0823 Last data filed at 12/15/2020 0206 Gross per 24 hour  Intake 2226.53 ml  Output 915 ml  Net 1311.53 ml    Intake/Output this shift: No intake/output data recorded.  Labs: No results for input(s): HGB in the last 72 hours. No results for input(s): WBC, RBC, HCT, PLT in the last 72 hours. No results for input(s): NA, K, CL, CO2, BUN, CREATININE, GLUCOSE, CALCIUM in the last 72 hours. No results for input(s): LABPT, INR in the last 72 hours.   EXAM General - Patient is Alert, Appropriate, and Oriented Extremity - Neurovascular intact Dorsiflexion/Plantar flexion intact Compartment soft Dressing/Incision -Postoperative dressing remains in place., Polar Care in place and working. , Hemovac in place.  Motor Function - intact, moving foot and toes well on exam. Able to perform independent SLR.  Cardiovascular- Regular rate and rhythm, no murmurs/rubs/gallops Respiratory- Lungs clear to auscultation bilaterally Gastrointestinal- soft, nontender, and active bowel sounds   Assessment/Plan: 1 Day Post-Op Procedure(s) (LRB): COMPUTER ASSISTED TOTAL KNEE ARTHROPLASTY (Right) Active  Problems:   Total knee replacement status  Estimated body mass index is 26.6 kg/m as calculated from the following:   Height as of this encounter: 5' 4.5" (1.638 m).   Weight as of this encounter: 71.4 kg. Advance diet Up with therapy       DVT Prophylaxis - Lovenox, Ted hose, and foot pumps Weight-Bearing as tolerated to right leg  Cassell Smiles, PA-C [facility] Orthopaedic Surgery 12/15/2020, 8:23 AM

## 2020-12-15 NOTE — Discharge Summary (Signed)
Physician Discharge Summary  Patient ID: Andrea Savage MRN: [id number] DOB/AGE: [birth year] 75 y.o.  Admit date: 12/14/2020 Discharge date: 12/15/2020  Admission Diagnoses:  Total knee replacement status [Z96.659]  Surgeries:Procedure(s): Right total knee arthroplasty using computer-assisted navigation   SURGEON:  Marciano Sequin. M.D.   ASSISTANT: Cassell Smiles, PA-C (present and scrubbed throughout the case, critical for assistance with exposure, retraction, instrumentation, and closure)   ANESTHESIA: spinal   ESTIMATED BLOOD LOSS: 50 mL   FLUIDS REPLACED: 1500 mL of crystalloid   TOURNIQUET TIME: 78 minutes   DRAINS: 2 medium Hemovac drains   SOFT TISSUE RELEASES: Anterior cruciate ligament, posterior cruciate ligament, deep medial collateral ligament, patellofemoral ligament   IMPLANTS UTILIZED: DePuy Attune size 4 posterior stabilized femoral component (cemented), size 4 rotating platform tibial component (cemented), 35 mm medialized dome patella (cemented), and a 6 mm stabilized rotating platform polyethylene insert.  Discharge Diagnoses: Patient Active Problem List   Diagnosis Date Noted   Total knee replacement status 12/14/2020   Claudication in peripheral vascular disease ([place]) 10/04/2020   Chronic heart failure with preserved ejection fraction ([place]) 12/17/2019   Cough 10/15/2019   Bronchitis 10/15/2019   Hyperlipidemia LDL goal <100 07/22/2019   Paroxysmal SVT (supraventricular tachycardia) ([place]) 06/13/2018   Near syncope 09/11/2017   Asthma 03/23/2017   Fibromyalgia 03/10/2016   Sensorineural hearing loss (SNHL) of left ear with restricted hearing of right ear 12/29/2015   Post herpetic neuralgia 11/12/2015   Gastro-esophageal reflux disease without esophagitis 09/21/2015   Depression 06/29/2015   Major depressive disorder, single episode 06/29/2015   Osteopenia    Reflux    Rosacea     Past Medical History:  Diagnosis Date   (HFpEF) heart  failure with preserved ejection fraction (HCC)    Anxiety    Asthma    Depression    Fibromyalgia    GERD (gastroesophageal reflux disease)    Grade II diastolic dysfunction    H/O scarlet fever    as child   Heart murmur    s/p scarlet fever as child   HLD (hyperlipidemia)    HTN (hypertension)    IBS (irritable bowel syndrome)    Low bone density    Mitral regurgitation    OA (osteoarthritis)    Osteopenia    Palpitations    PHN (postherpetic neuralgia)    PSVT (paroxysmal supraventricular tachycardia) ([place])    Recurrent syncope    Rosacea    Seizure ([place]) 1978   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   Wears hearing aid in both ears      Transfusion:    Consultants (if any):   Discharged Condition: Improved  Hospital Course: Andrea Savage is an 75 y.o. female who was admitted 12/14/2020 with a diagnosis of right knee osteoarthritis and went to the operating room on 12/14/2020 and underwent right total knee arthroplasty. The patient received perioperative antibiotics for prophylaxis (see below). The patient tolerated the procedure well and was transported to PACU in stable condition. After meeting PACU criteria, the patient was subsequently transferred to the Orthopaedics/Rehabilitation unit.   The patient received DVT prophylaxis in the form of early mobilization, Lovenox, Foot Pumps, and TED hose. A sacral pad had been placed and heels were elevated off of the bed with rolled towels in order to protect skin integrity. Foley catheter was discontinued on postoperative day #0. Wound drains were discontinued on postoperative day #1. The surgical incision was healing well without signs of infection.  Physical therapy  was initiated postoperatively for transfers, gait training, and strengthening. Occupational therapy was initiated for activities of daily living and evaluation for assisted devices. Rehabilitation goals were reviewed in detail with the patient. The patient made  steady progress with physical therapy and physical therapy recommended discharge to Home.   The patient achieved the preliminary goals of this hospitalization and was felt to be medically and orthopaedically appropriate for discharge.  She was given perioperative antibiotics:  Anti-infectives (From admission, onward)    Start     Dose/Rate Route Frequency Ordered Stop   12/14/20 1830  ceFAZolin (ANCEF) IVPB 2g/100 mL premix        2 g 200 mL/hr over 30 Minutes Intravenous Every 6 hours 12/14/20 1730 12/15/20 0204   12/14/20 1046  ceFAZolin (ANCEF) 2-4 GM/100ML-% IVPB       Note to Pharmacy: [place] [facility], [facility]: cabinet override      12/14/20 1046 12/14/20 2259   12/14/20 0600  ceFAZolin (ANCEF) IVPB 2g/100 mL premix  Status:  Discontinued        2 g 200 mL/hr over 30 Minutes Intravenous On call to O.R. 12/14/20 0016 12/14/20 4696     .  Recent vital signs:  Vitals:   12/15/20 1121 12/15/20 1508  BP: 129/71 137/72  Pulse: 67 65  Resp: 16 18  Temp: 98.5 F (36.9 C) 98.2 F (36.8 C)  SpO2: 98% 99%    Recent laboratory studies:  No results for input(s): WBC, HGB, HCT, PLT, K, CL, CO2, BUN, CREATININE, GLUCOSE, CALCIUM, LABPT, INR in the last 72 hours.  Diagnostic Studies: DG Knee Right Port  Result Date: 12/14/2020 CLINICAL DATA:  Postop right knee arthroplasty EXAM: PORTABLE RIGHT KNEE - 1-2 VIEW COMPARISON:  07/20/2020 FINDINGS: Frontal and cross-table lateral views of the right knee demonstrate 3 component right knee arthroplasty in the expected position without signs of acute complication. Postsurgical changes in the soft tissues. Surgical drain in the suprapatellar region. IMPRESSION: 1. Unremarkable right knee arthroplasty. Electronically Signed   By: Randa Ngo M.D.   On: 12/14/2020 15:43    Discharge Medications:   Allergies as of 12/15/2020       Reactions   Compazine [prochlorperazine] Other (See Comments)   Grand mal seizure   Dust Mite Extract Cough    Gabapentin Other (See Comments)   Patient reports adverse reaction.        Medication List     TAKE these medications    albuterol 108 (90 Base) MCG/ACT inhaler Commonly known as: VENTOLIN HFA Inhale 2 puffs into the lungs every 6 (six) hours as needed for wheezing or shortness of breath.   busPIRone 5 MG tablet Commonly known as: BUSPAR Take 1 tablet (5 mg total) by mouth daily. What changed:  when to take this reasons to take this   carboxymethylcellulose 0.5 % Soln Commonly known as: REFRESH PLUS Place 1 drop into both eyes 3 (three) times daily as needed (dry eyes).   celecoxib 200 MG capsule Commonly known as: CELEBREX Take 1 capsule (200 mg total) by mouth 2 (two) times daily. What changed:  medication strength how much to take   citalopram 20 MG tablet Commonly known as: CELEXA Take 20 mg by mouth daily.   enoxaparin 40 MG/0.4ML injection Commonly known as: LOVENOX Inject 0.4 mLs (40 mg total) into the skin daily for 14 days.   EPINEPHrine 0.3 mg/0.3 mL Soaj injection Commonly known as: EPI-PEN Inject 0.3 mg into the muscle as needed for anaphylaxis.  ezetimibe 10 MG tablet Commonly known as: ZETIA Take 1 tablet (10 mg total) by mouth daily.   furosemide 20 MG tablet Commonly known as: LASIX Take 1 tablet (20 mg total) by mouth daily. What changed:  when to take this reasons to take this   HYDROcodone-acetaminophen 5-325 MG tablet Commonly known as: Norco Take 1 tablet by mouth every 4 (four) hours as needed for severe pain. What changed:  how much to take reasons to take this   loratadine 10 MG tablet Commonly known as: CLARITIN Take 10 mg by mouth daily.   metoprolol succinate 25 MG 24 hr tablet Commonly known as: TOPROL-XL Take 0.5 tablets (12.5 mg total) by mouth daily.   omeprazole 40 MG capsule Commonly known as: PRILOSEC Take 40 mg by mouth in the morning and at bedtime.   OVER THE COUNTER MEDICATION Take 1 capsule by mouth at  bedtime. Amare Mood +   OVER THE COUNTER MEDICATION Take 1 tablet by mouth daily as needed (constipation). Swiss Kriss Herbal Laxative   OVER THE COUNTER MEDICATION Take 1 capsule by mouth daily. Amare Focus   OVER THE COUNTER MEDICATION Take 1 capsule by mouth daily. Amare Sync Brain/Gut Health   Salonpas 3.03-12-08 % Ptch Generic drug: Camphor-Menthol-Methyl Sal Apply 1 patch topically daily as needed (knee pain).   traMADol 50 MG tablet Commonly known as: ULTRAM Take 1 tablet (50 mg total) by mouth every 4 (four) hours as needed for moderate pain.               Durable Medical Equipment  (From admission, onward)           Start     Ordered   12/14/20 1731  DME Walker rolling  Once       Question:  Patient needs a walker to treat with the following condition  Answer:  Total knee replacement status   12/14/20 1731   12/14/20 1731  DME Bedside commode  Once       Question:  Patient needs a bedside commode to treat with the following condition  Answer:  Total knee replacement status   12/14/20 1731            Disposition: Home with home health PT     Follow-up Information     Watt Climes, PA Follow up on 12/30/2020.   Specialty: Physician Assistant Why: at 10:15am Contact information: [place] [address] [phone]         Dereck Leep, MD Follow up on 02/02/2021.   Specialty: Orthopedic Surgery Why: at 11:30am Contact information: [place] [address] [place], PA-C 12/15/2020, 5:39 PM

## 2020-12-15 NOTE — Anesthesia Postprocedure Evaluation (Signed)
Anesthesia Post Note  Patient: Andrea Savage  Procedure(s) Performed: COMPUTER ASSISTED TOTAL KNEE ARTHROPLASTY (Right: Knee)  Patient location during evaluation: Nursing Unit Anesthesia Type: Spinal Level of consciousness: awake and alert and oriented Pain management: pain level controlled Vital Signs Assessment: post-procedure vital signs reviewed and stable Respiratory status: respiratory function stable Cardiovascular status: stable Postop Assessment: no headache, no backache, patient able to bend at knees, able to ambulate, adequate PO intake and no apparent nausea or vomiting Anesthetic complications: no   No notable events documented.   Last Vitals:  Vitals:   12/15/20 0523 12/15/20 0717  BP: 123/62 135/67  Pulse: 72 63  Resp: 16 16  Temp: 36.6 C 36.6 C  SpO2: 98% 100%    Last Pain:  Vitals:   12/15/20 0717  TempSrc: Oral  PainSc:                  [name]

## 2020-12-15 NOTE — Progress Notes (Addendum)
Physical Therapy Treatment Patient Details Name: Andrea Savage MRN: [id number] DOB: January 27, 1946 Today's Date: 12/15/2020   History of Present Illness Pt is a 75 yo F diagnosed with degenerative arthrosis of the right knee and is s/p elective R TKA. PMH includes: anxiety, depression, HTN, pulmonary HTN, OA, CHF, hiatal hernia, GERD, and osteopenia.    PT Comments    Pt was pleasant and motivated to participate during the session and made good progress towards goals this session. Pt was able to perform all functional tasks without physical assistance and reported no adverse symptoms during the session other than mod R knee pain. Pt was steady with ambulation with improved gait quality and ascended/descended stairs with good eccentric and concentric control.  Pt will benefit from HHPT upon discharge to safely address deficits listed in patient problem list for decreased caregiver assistance and eventual return to PLOF.    Recommendations for follow up therapy are one component of a multi-disciplinary discharge planning process, led by the attending physician.  Recommendations may be updated based on patient status, additional functional criteria and insurance authorization.  Follow Up Recommendations  Home health PT;Supervision for mobility/OOB     Equipment Recommendations  None recommended by PT    Recommendations for Other Services       Precautions / Restrictions Precautions Precautions: Fall Restrictions Weight Bearing Restrictions: Yes RLE Weight Bearing: Weight bearing as tolerated Other Position/Activity Restrictions: No KI required, pt able to perform Ind RLE SLR's without extensor lag     Mobility  Bed Mobility Overal bed mobility: Modified Independent             General bed mobility comments: Min extra time and effort only    Transfers Overall transfer level: Needs assistance Equipment used: Rolling walker (2 wheeled) Transfers: Sit to/from Stand Sit to  Stand: Min guard         General transfer comment: Good control and stability with min to mod multi-modal cues for sequencing  Ambulation/Gait Ambulation/Gait assistance: Min guard Gait Distance (Feet): 200 Feet Assistive device: Rolling walker (2 wheeled) Gait Pattern/deviations: Step-to pattern;Decreased stance time - right;Antalgic;Step-through pattern Gait velocity: decreased   General Gait Details: Mildly antalgic gait pattern on the RLE with step-to gait pattern that progressed to step-through; steady without LOB or buckling   Stairs Stairs: Yes Stairs assistance: Min guard Stair Management: Two rails;Step to pattern;Forwards Number of Stairs: 4 General stair comments: Verbal and visual cues for sequencing with good control and stability; dtr present for training and educated on proper guarding techniques   Wheelchair Mobility    Modified Rankin (Stroke Patients Only)       Balance Overall balance assessment: Needs assistance   Sitting balance-Leahy Scale: Normal     Standing balance support: During functional activity;Bilateral upper extremity supported Standing balance-Leahy Scale: Good                              Cognition Arousal/Alertness: Awake/alert Behavior During Therapy: WFL for tasks assessed/performed Overall Cognitive Status: Within Functional Limits for tasks assessed                                        Exercises Total Joint Exercises Ankle Circles/Pumps: AROM;Strengthening;Both;10 reps Quad Sets: AROM;Strengthening;Right;5 reps;10 reps Hip ABduction/ADduction: AROM;Strengthening;Both;5 reps Straight Leg Raises: Both;5 reps;Strengthening;AROM Long Arc Quad: AROM;Strengthening;Right;5 reps;10 reps Knee Flexion:  AROM;Strengthening;Right;5 reps;10 reps Goniometric ROM: R knee AROM: grossly 5-80 deg Marching in Standing: AROM;Strengthening;Both;5 reps;Standing Other Exercises Other Exercises: Car transfer  sequencing education provided Other Exercises: 90 deg R turn training/review provided to prevent CKC twisting on R knee     General Comments        Pertinent Vitals/Pain Pain Assessment: 0-10 Pain Score: 5  Pain Location: R Knee Pain Descriptors / Indicators: Sore Pain Intervention(s): Premedicated before session;Repositioned;Monitored during session;Ice applied    Home Living Family/patient expects to be discharged to:: Private residence Living Arrangements: Spouse/significant other Available Help at Discharge: Family;Available 24 hours/day Type of Home: House Home Access: Stairs to enter Entrance Stairs-Rails: None Home Layout: Two level;Able to live on main level with bedroom/bathroom Home Equipment: Gilford Rile - 2 wheels;Walker - 4 wheels;Cane - single point;Bedside commode;Hand held shower head;Adaptive equipment Additional Comments: the [facility] was given to her from a friend, very old    Prior Function Level of Independence: Independent      Comments: Ind amb without an AD community distances, no fall history, Ind with ADLs   PT Goals (current goals can now be found in the care plan section) Acute Rehab PT Goals Patient Stated Goal: To walk better and to travel PT Goal Formulation: With patient Time For Goal Achievement: 12/28/20 Potential to Achieve Goals: Good Progress towards PT goals: Progressing toward goals    Frequency    BID      PT Plan Current plan remains appropriate    Co-evaluation              AM-PAC PT "6 Clicks" Mobility   Outcome Measure  Help needed turning from your back to your side while in a flat bed without using bedrails?: A Little Help needed moving from lying on your back to sitting on the side of a flat bed without using bedrails?: A Little Help needed moving to and from a bed to a chair (including a wheelchair)?: A Little Help needed standing up from a chair using your arms (e.g., wheelchair or bedside chair)?: A Little Help  needed to walk in hospital room?: A Little Help needed climbing 3-5 steps with a railing? : A Little 6 Click Score: 18    End of Session Equipment Utilized During Treatment: Gait belt Activity Tolerance: Patient tolerated treatment well Patient left: in bed;with call bell/phone within reach;with bed alarm set;with family/visitor present;with SCD's reapplied;Other (comment) (polar care donned to R knee) Nurse Communication: Mobility status;Weight bearing status PT Visit Diagnosis: Other abnormalities of gait and mobility (R26.89);Muscle weakness (generalized) (M62.81);Pain Pain - Right/Left: Right Pain - part of body: Knee     Time: 1325-1406 PT Time Calculation (min) (ACUTE ONLY): 41 min  Charges:  $Gait Training: 23-37 mins $Therapeutic Exercise: 8-22 mins $Therapeutic Activity: 8-22 mins                    D. Scott Jaynee Winters PT, DPT 12/15/20, 2:35 PM

## 2020-12-15 NOTE — Progress Notes (Signed)
Met with the patient to discuss DC plan and needs She lives at home with her husband She has a rolling walker and 2 bedside commodes, she will use one as a shower seat, her husband will be installing grab bars at the shower She has transportation and can afford her medication, she is set up with [place] for [facility] PT No additional needs

## 2020-12-16 DIAGNOSIS — F419 Anxiety disorder, unspecified: Secondary | ICD-10-CM | POA: Diagnosis not present

## 2020-12-16 DIAGNOSIS — Z7901 Long term (current) use of anticoagulants: Secondary | ICD-10-CM | POA: Diagnosis not present

## 2020-12-16 DIAGNOSIS — M797 Fibromyalgia: Secondary | ICD-10-CM | POA: Diagnosis not present

## 2020-12-16 DIAGNOSIS — Z9181 History of falling: Secondary | ICD-10-CM | POA: Diagnosis not present

## 2020-12-16 DIAGNOSIS — I110 Hypertensive heart disease with heart failure: Secondary | ICD-10-CM | POA: Diagnosis not present

## 2020-12-16 DIAGNOSIS — K449 Diaphragmatic hernia without obstruction or gangrene: Secondary | ICD-10-CM | POA: Diagnosis not present

## 2020-12-16 DIAGNOSIS — G40409 Other generalized epilepsy and epileptic syndromes, not intractable, without status epilepticus: Secondary | ICD-10-CM | POA: Diagnosis not present

## 2020-12-16 DIAGNOSIS — M8580 Other specified disorders of bone density and structure, unspecified site: Secondary | ICD-10-CM | POA: Diagnosis not present

## 2020-12-16 DIAGNOSIS — J40 Bronchitis, not specified as acute or chronic: Secondary | ICD-10-CM | POA: Diagnosis not present

## 2020-12-16 DIAGNOSIS — E785 Hyperlipidemia, unspecified: Secondary | ICD-10-CM | POA: Diagnosis not present

## 2020-12-16 DIAGNOSIS — Z96651 Presence of right artificial knee joint: Secondary | ICD-10-CM | POA: Diagnosis not present

## 2020-12-16 DIAGNOSIS — H90A22 Sensorineural hearing loss, unilateral, left ear, with restricted hearing on the contralateral side: Secondary | ICD-10-CM | POA: Diagnosis not present

## 2020-12-16 DIAGNOSIS — I5032 Chronic diastolic (congestive) heart failure: Secondary | ICD-10-CM | POA: Diagnosis not present

## 2020-12-16 DIAGNOSIS — I471 Supraventricular tachycardia: Secondary | ICD-10-CM | POA: Diagnosis not present

## 2020-12-16 DIAGNOSIS — I739 Peripheral vascular disease, unspecified: Secondary | ICD-10-CM | POA: Diagnosis not present

## 2020-12-16 DIAGNOSIS — Z471 Aftercare following joint replacement surgery: Secondary | ICD-10-CM | POA: Diagnosis not present

## 2020-12-16 DIAGNOSIS — K219 Gastro-esophageal reflux disease without esophagitis: Secondary | ICD-10-CM | POA: Diagnosis not present

## 2020-12-16 DIAGNOSIS — R053 Chronic cough: Secondary | ICD-10-CM | POA: Diagnosis not present

## 2020-12-16 DIAGNOSIS — I051 Rheumatic mitral insufficiency: Secondary | ICD-10-CM | POA: Diagnosis not present

## 2020-12-16 DIAGNOSIS — I272 Pulmonary hypertension, unspecified: Secondary | ICD-10-CM | POA: Diagnosis not present

## 2020-12-16 DIAGNOSIS — K589 Irritable bowel syndrome without diarrhea: Secondary | ICD-10-CM | POA: Diagnosis not present

## 2020-12-16 DIAGNOSIS — F329 Major depressive disorder, single episode, unspecified: Secondary | ICD-10-CM | POA: Diagnosis not present

## 2020-12-25 DIAGNOSIS — J301 Allergic rhinitis due to pollen: Secondary | ICD-10-CM | POA: Diagnosis not present

## 2020-12-29 DIAGNOSIS — J40 Bronchitis, not specified as acute or chronic: Secondary | ICD-10-CM | POA: Diagnosis not present

## 2020-12-29 DIAGNOSIS — Z9181 History of falling: Secondary | ICD-10-CM | POA: Diagnosis not present

## 2020-12-29 DIAGNOSIS — M8580 Other specified disorders of bone density and structure, unspecified site: Secondary | ICD-10-CM | POA: Diagnosis not present

## 2020-12-29 DIAGNOSIS — I272 Pulmonary hypertension, unspecified: Secondary | ICD-10-CM | POA: Diagnosis not present

## 2020-12-29 DIAGNOSIS — E785 Hyperlipidemia, unspecified: Secondary | ICD-10-CM | POA: Diagnosis not present

## 2020-12-29 DIAGNOSIS — Z471 Aftercare following joint replacement surgery: Secondary | ICD-10-CM | POA: Diagnosis not present

## 2020-12-29 DIAGNOSIS — I5032 Chronic diastolic (congestive) heart failure: Secondary | ICD-10-CM | POA: Diagnosis not present

## 2020-12-29 DIAGNOSIS — M797 Fibromyalgia: Secondary | ICD-10-CM | POA: Diagnosis not present

## 2020-12-29 DIAGNOSIS — I471 Supraventricular tachycardia: Secondary | ICD-10-CM | POA: Diagnosis not present

## 2020-12-29 DIAGNOSIS — K219 Gastro-esophageal reflux disease without esophagitis: Secondary | ICD-10-CM | POA: Diagnosis not present

## 2020-12-29 DIAGNOSIS — F419 Anxiety disorder, unspecified: Secondary | ICD-10-CM | POA: Diagnosis not present

## 2020-12-29 DIAGNOSIS — I110 Hypertensive heart disease with heart failure: Secondary | ICD-10-CM | POA: Diagnosis not present

## 2020-12-29 DIAGNOSIS — H90A22 Sensorineural hearing loss, unilateral, left ear, with restricted hearing on the contralateral side: Secondary | ICD-10-CM | POA: Diagnosis not present

## 2020-12-29 DIAGNOSIS — F329 Major depressive disorder, single episode, unspecified: Secondary | ICD-10-CM | POA: Diagnosis not present

## 2020-12-29 DIAGNOSIS — K449 Diaphragmatic hernia without obstruction or gangrene: Secondary | ICD-10-CM | POA: Diagnosis not present

## 2020-12-29 DIAGNOSIS — Z7901 Long term (current) use of anticoagulants: Secondary | ICD-10-CM | POA: Diagnosis not present

## 2020-12-29 DIAGNOSIS — R053 Chronic cough: Secondary | ICD-10-CM | POA: Diagnosis not present

## 2020-12-29 DIAGNOSIS — K589 Irritable bowel syndrome without diarrhea: Secondary | ICD-10-CM | POA: Diagnosis not present

## 2020-12-29 DIAGNOSIS — I739 Peripheral vascular disease, unspecified: Secondary | ICD-10-CM | POA: Diagnosis not present

## 2020-12-29 DIAGNOSIS — G40409 Other generalized epilepsy and epileptic syndromes, not intractable, without status epilepticus: Secondary | ICD-10-CM | POA: Diagnosis not present

## 2020-12-29 DIAGNOSIS — Z96651 Presence of right artificial knee joint: Secondary | ICD-10-CM | POA: Diagnosis not present

## 2020-12-29 DIAGNOSIS — I051 Rheumatic mitral insufficiency: Secondary | ICD-10-CM | POA: Diagnosis not present

## 2020-12-31 DIAGNOSIS — Z4789 Encounter for other orthopedic aftercare: Secondary | ICD-10-CM | POA: Diagnosis not present

## 2021-01-05 DIAGNOSIS — Z4789 Encounter for other orthopedic aftercare: Secondary | ICD-10-CM | POA: Diagnosis not present

## 2021-01-07 DIAGNOSIS — Z4789 Encounter for other orthopedic aftercare: Secondary | ICD-10-CM | POA: Diagnosis not present

## 2021-01-11 ENCOUNTER — Other Ambulatory Visit: Payer: Self-pay | Admitting: Family

## 2021-01-11 DIAGNOSIS — E782 Mixed hyperlipidemia: Secondary | ICD-10-CM

## 2021-01-11 NOTE — Telephone Encounter (Signed)
Rx request sent to pharmacy.  

## 2021-01-12 DIAGNOSIS — H9122 Sudden idiopathic hearing loss, left ear: Secondary | ICD-10-CM | POA: Diagnosis not present

## 2021-01-14 DIAGNOSIS — Z4789 Encounter for other orthopedic aftercare: Secondary | ICD-10-CM | POA: Diagnosis not present

## 2021-01-18 DIAGNOSIS — Z4789 Encounter for other orthopedic aftercare: Secondary | ICD-10-CM | POA: Diagnosis not present

## 2021-01-19 DIAGNOSIS — J45991 Cough variant asthma: Secondary | ICD-10-CM | POA: Diagnosis not present

## 2021-01-19 DIAGNOSIS — H90A22 Sensorineural hearing loss, unilateral, left ear, with restricted hearing on the contralateral side: Secondary | ICD-10-CM | POA: Diagnosis not present

## 2021-01-19 DIAGNOSIS — H9122 Sudden idiopathic hearing loss, left ear: Secondary | ICD-10-CM | POA: Diagnosis not present

## 2021-01-20 DIAGNOSIS — Z4789 Encounter for other orthopedic aftercare: Secondary | ICD-10-CM | POA: Diagnosis not present

## 2021-01-26 DIAGNOSIS — Z4789 Encounter for other orthopedic aftercare: Secondary | ICD-10-CM | POA: Diagnosis not present

## 2021-02-02 DIAGNOSIS — Z96651 Presence of right artificial knee joint: Secondary | ICD-10-CM | POA: Diagnosis not present

## 2021-02-02 DIAGNOSIS — Z4789 Encounter for other orthopedic aftercare: Secondary | ICD-10-CM | POA: Diagnosis not present

## 2021-02-19 DIAGNOSIS — Z0000 Encounter for general adult medical examination without abnormal findings: Secondary | ICD-10-CM | POA: Diagnosis not present

## 2021-02-19 DIAGNOSIS — M797 Fibromyalgia: Secondary | ICD-10-CM | POA: Diagnosis not present

## 2021-02-19 DIAGNOSIS — R053 Chronic cough: Secondary | ICD-10-CM | POA: Diagnosis not present

## 2021-02-19 DIAGNOSIS — F411 Generalized anxiety disorder: Secondary | ICD-10-CM | POA: Diagnosis not present

## 2021-02-19 DIAGNOSIS — Z1159 Encounter for screening for other viral diseases: Secondary | ICD-10-CM | POA: Diagnosis not present

## 2021-02-19 DIAGNOSIS — E782 Mixed hyperlipidemia: Secondary | ICD-10-CM | POA: Diagnosis not present

## 2021-02-19 DIAGNOSIS — R252 Cramp and spasm: Secondary | ICD-10-CM | POA: Diagnosis not present

## 2021-02-19 DIAGNOSIS — K219 Gastro-esophageal reflux disease without esophagitis: Secondary | ICD-10-CM | POA: Diagnosis not present

## 2021-03-24 ENCOUNTER — Other Ambulatory Visit: Payer: Self-pay

## 2021-03-24 ENCOUNTER — Ambulatory Visit (INDEPENDENT_AMBULATORY_CARE_PROVIDER_SITE_OTHER): Payer: PPO | Admitting: Medical

## 2021-03-24 ENCOUNTER — Encounter: Payer: Self-pay | Admitting: Medical

## 2021-03-24 VITALS — BP 140/80 | HR 77 | Ht 64.5 in | Wt 165.0 lb

## 2021-03-24 DIAGNOSIS — R600 Localized edema: Secondary | ICD-10-CM

## 2021-03-24 DIAGNOSIS — I5032 Chronic diastolic (congestive) heart failure: Secondary | ICD-10-CM

## 2021-03-24 DIAGNOSIS — I471 Supraventricular tachycardia: Secondary | ICD-10-CM

## 2021-03-24 DIAGNOSIS — E785 Hyperlipidemia, unspecified: Secondary | ICD-10-CM

## 2021-03-24 MED ORDER — METOPROLOL SUCCINATE ER 25 MG PO TB24: 25.0000 mg | ORAL_TABLET | Freq: Every day | ORAL | 3 refills | Status: DC

## 2021-03-24 NOTE — Progress Notes (Signed)
Cardiology Office Note:    Date:  03/24/2021   ID:  Andrea Savage, DOB November 09, 1945, MRN [id number]  PCP:  [name], MD  [facility] Cardiologist:  Nelva Bush, MD  [facility] Electrophysiologist:  None   Referring MD: [name], MD   Chief Complaint: 6 month follow-up  History of Present Illness:    Andrea Savage is a 76 y.o. female with a hx of with a hx of HLD, HFpEF, palpitations/PSVT, recurrent syncope/near syncope, possible asthma, fibromyalgia, depression, postherpetic neuralgia  presents today for 6 month follow-up.   Echo 09/2017 with normal LVEF and mild MR. Lexiscan myoview 09/2017 low risk study with no evidence of ischemia . Long term monitor 10/2017 with rare PAC/PVC and several episodes of PSVT. She was subsequently started on Metoprolol with improvement.    Echo 06/2019 with LVEF 60-65%, gr2DD, mild to moderate MR, mildly elevated PASP, and RA pressure 3mmHg. She was started on Lasix 20mg  daily. Lower extremity ABI ordered due to RLE weakness with results 06/27/19 with bilateral LE ABI normal.    She has previously been on Crestor 40mg  daily with reported myalgias, this was stopped. She was initially on Zetia though transitioned to [place] for optimal lipid control.  However this was cost prohibitive did not resume Zetia.     Last seen 06/2020 and reported b/l ankle edema on lasix. Zetia was started  She underwent right knee replacement 3 months ago. 2 months before that she had meniscus removed.   Today, the patient reports before she first started metoprolol she was having SOB and abnormal heart rate, and metoprolol helped with this. However, more recently she has been having more frequent episodes of very brief palpitations/abnormal heart beat with SOB with possible ?vision changes. She also going through a lot of pain with knee surgeries. She has taken an extra half pill of metoprolol, and this has improved the symptoms. No chest pain.  She reports some lower extremity swelling from the knee issues. Worse at the end of the day and traveling. She has lasix 20mg  to take, but has not been taking this. Also eats a lot of salt. And reports her weight has gone up with inactivity due to knee issues.   Past Medical History:  Diagnosis Date   (HFpEF) heart failure with preserved ejection fraction (HCC)    Anxiety    Asthma    Depression    Fibromyalgia    GERD (gastroesophageal reflux disease)    Grade II diastolic dysfunction    H/O scarlet fever    as child   Heart murmur    s/p scarlet fever as child   HLD (hyperlipidemia)    HTN (hypertension)    IBS (irritable bowel syndrome)    Low bone density    Mitral regurgitation    OA (osteoarthritis)    Osteopenia    Palpitations    PHN (postherpetic neuralgia)    PSVT (paroxysmal supraventricular tachycardia) ([place])    Recurrent syncope    Rosacea    Seizure ([place]) 1978   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   Wears hearing aid in both ears     Past Surgical History:  Procedure Laterality Date   ABDOMINAL HYSTERECTOMY  2009   Total   BREAST EXCISIONAL BIOPSY Left 1979   NEG   COLONOSCOPY  732202   repeat 10 years   COLONOSCOPY WITH PROPOFOL N/A 03/24/2020   Procedure: COLONOSCOPY WITH PROPOFOL;  Surgeon: Lesly Rubenstein, MD;  Location: [facility] END[facility];  Service: Endoscopy;  Laterality: N/A;   ESOPHAGOGASTRODUODENOSCOPY (EGD) WITH PROPOFOL N/A 03/24/2020   Procedure: ESOPHAGOGASTRODUODENOSCOPY (EGD) WITH PROPOFOL;  Surgeon: Lesly Rubenstein, MD;  Location: [facility] END[facility];  Service: Endoscopy;  Laterality: N/A;   EXCISION MORTON'S NEUROMA Left 09/21/2017   Procedure: EXCISION MORTON'S NEUROMA;  Surgeon: Albertine Patricia, DPM;  Location: [place];  Service: Podiatry;  Laterality: Left;   FOOT SURGERY Right 1990   HAND SURGERY Right    KNEE ARTHROPLASTY Right 12/14/2020   Procedure: COMPUTER ASSISTED TOTAL KNEE ARTHROPLASTY;  Surgeon: Dereck Leep, MD;   Location: [facility] ORS;  Service: Orthopedics;  Laterality: Right;   KNEE ARTHROSCOPY Right 10/14/2020   Procedure: ARTHROSCOPY KNEE, LATERAL MENISECTOMY;  Surgeon: Dereck Leep, MD;  Location: [facility] OR[facility];  Service: Orthopedics;  Laterality: Right;   LAPAROSCOPIC OOPHERECTOMY     MINOR HARDWARE REMOVAL Left 11/22/2018   Procedure: REMOVAL SCREW;DEEP 2nd and 3RD LEFT CAPSULE RELEASE OF THIRD METATARSAL PHALANGEAL JOINT LEFT, TENDON LENGTHING THIRD TOE LEFT AND CORTISONE INJECTION LEFT;  Surgeon: Albertine Patricia, DPM;  Location: [place];  Service: Podiatry;  Laterality: Left;  LMA OR IVA LOCAL   TONSILLECTOMY     TONSILLECTOMY     WEIL OSTEOTOMY Left 09/21/2017   Procedure: WEIL OSTEOTOMY-2ND & 3RD TOES;  Surgeon: Albertine Patricia, DPM;  Location: [place];  Service: Podiatry;  Laterality: Left;  LMA WITH LOCAL MINI MONSTER DR TROXLER WANTS TED HOSE ON PATIENT'S RIGHT LEG    Current Medications: Current Meds  Medication Sig   acetaminophen (TYLENOL) 325 MG tablet Take by mouth.   albuterol (VENTOLIN HFA) 108 (90 Base) MCG/ACT inhaler Inhale 2 puffs into the lungs every 6 (six) hours as needed for wheezing or shortness of breath.   busPIRone (BUSPAR) 5 MG tablet Take 1 tablet (5 mg total) by mouth daily. (Patient taking differently: Take 5 mg by mouth 2 (two) times daily as needed (anxiety).)   carboxymethylcellulose (REFRESH PLUS) 0.5 % SOLN Place 1 drop into both eyes as needed (dry eyes).   chlorpheniramine-HYDROcodone (TUSSIONEX) 10-8 MG/5ML SUER Take by mouth.   citalopram (CELEXA) 20 MG tablet Take 20 mg by mouth daily.   EPINEPHrine 0.3 mg/0.3 mL IJ SOAJ injection Inject 0.3 mg into the muscle as needed for anaphylaxis.   ezetimibe (ZETIA) 10 MG tablet Take 1 tablet (10 mg total) by mouth daily.   fluticasone (FLOVENT HFA) 220 MCG/ACT inhaler Inhale into the lungs.   furosemide (LASIX) 20 MG tablet Take 1 tablet (20 mg total) by mouth daily. (Patient taking  differently: Take 20 mg by mouth daily as needed for edema.)   loratadine (CLARITIN) 10 MG tablet Take 10 mg by mouth daily.   omeprazole (PRILOSEC) 40 MG capsule Take 40 mg by mouth in the morning and at bedtime.   OVER THE COUNTER MEDICATION Take 1 capsule by mouth at bedtime. Amare Mood +   OVER THE COUNTER MEDICATION Take 1 capsule by mouth daily. Amare Focus   OVER THE COUNTER MEDICATION Take 1 capsule by mouth daily. Amare Sync Brain/Gut Health   traMADol (ULTRAM) 50 MG tablet Take 1 tablet (50 mg total) by mouth every 4 (four) hours as needed for moderate pain.   [DISCONTINUED] metoprolol succinate (TOPROL-XL) 25 MG 24 hr tablet Take 0.5 tablets (12.5 mg total) by mouth daily.     Allergies:   Compazine [prochlorperazine], Dust mite extract, and Gabapentin   Social History   Socioeconomic History   Marital status: Married  Spouse name: Not on file   Number of children: Not on file   Years of education: Not on file   Highest education level: Some college, no degree  Occupational History   Occupation: retired  Tobacco Use   Smoking status: Never   Smokeless tobacco: Never  Vaping Use   Vaping Use: Never used  Substance and Sexual Activity   Alcohol use: Yes    Alcohol/week: 1.0 standard drink    Types: 1 Glasses of wine per week    Comment: on occasion   Drug use: No   Sexual activity: Not Currently  Other Topics Concern   Not on file  Social History Narrative   Pt lives in 2 story home with her husband, [name]   Has 2 adult children   Some college education   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  ROS:   Please see the history of present illness.     All other systems reviewed and are negative.  EKGs/Labs/Other Studies Reviewed:    The following studies were reviewed today:  Echo 06/21/19  1. Left ventricular ejection fraction, by estimation, is 60 to 65%. The  left ventricle has normal function. The left ventricle has no regional  wall motion abnormalities. Left ventricular diastolic parameters are  consistent with Grade II diastolic  dysfunction (pseudonormalization).   2. Right ventricular systolic function is normal. The right ventricular  size is normal. There is mildly elevated pulmonary artery systolic  pressure.   3. The mitral valve is normal in structure. Mild to moderate mitral valve  regurgitation.   4. The inferior vena cava is dilated in size with <50% respiratory  variability, suggesting right atrial pressure of 15 mmHg.    LE Arterial study 06/27/2019 Summary:  Right: Resting right ankle-brachial index is within normal range.  No evidence of significant right lower extremity arterial disease. The  right toe-brachial index is normal.  Left: Resting left ankle-brachial index is within normal range.  No evidence of significant left lower extremity arterial disease. The left  toe-brachial index is normal.    Zio 10/2017 The patient was monitored for 12 days, 19 hours. The predominant rhythm was sinus with an average rate of 76 bpm (range 53-135 bpm in sinus). There were rare PAC's and PVC's. 33 atrial runs were observed, with a maximal rate of 203 bpm. The longest run lasted 17.3 seconds. No sustained arrhythmia or prolonged pause was identified. Patient triggered events correspond to sinus rhythm, PAC's, and atrial runs. Predominantly sinus rhythm with rare PAC's and PVC's.  Multiple atrial runs  lasting up to 17 seconds were noted, consistent with paroxysmal supraventricular tachycardia.   09/2017 Lexiscan No T wave inversion was noted during stress. The study is normal. This is a low risk study. The left ventricular ejection fraction is hyperdynamic (>65%).  EKG:  EKG is  ordered today.  The ekg ordered today demonstrates NSR, 77bpm, IVCD, no  nonpoecific ST changes (old)  Recent Labs: 12/07/2020: BUN 17; Creatinine, Ser 0.86; Hemoglobin 13.8; Platelets 195; Potassium 4.0; Sodium 137  Recent Lipid Panel    Component Value Date/Time   CHOL 219 (H) 09/30/2019 0937   CHOL 228 (H) 06/20/2019 1053   TRIG 75 09/30/2019 0937   HDL 76 09/30/2019 0937   HDL 59 06/20/2019 1053   CHOLHDL 2.9 09/30/2019 0937   VLDL 15 09/30/2019 0937   LDLCALC 128 (H) 09/30/2019 0937   LDLCALC 142 (H) 06/20/2019 1053   LDLDIRECT 149 (H) 06/20/2019 1053     Physical Exam:    VS:  BP 140/80 (BP Location: Left Arm, Patient Position: Sitting, Cuff Size: Normal)    Pulse 77    Ht 5' 4.5" (1.638 m)    Wt 165 lb (74.8 kg)    SpO2 97%    BMI 27.88 kg/m     Wt Readings from Last 3 Encounters:  03/24/21 165 lb (74.8 kg)  12/14/20 157 lb 6.5 oz (71.4 kg)  12/07/20 157 lb 6.5 oz (71.4 kg)     GEN:  Well nourished, well developed in no acute distress HEENT: Normal NECK: No JVD; No carotid bruits LYMPHATICS: No lymphadenopathy CARDIAC: RRR, no murmurs, rubs, gallops RESPIRATORY:  Clear to auscultation without rales, wheezing or rhonchi  ABDOMEN: Soft, non-tender, non-distended MUSCULOSKELETAL:  Trace lower leg edema; No deformity  SKIN: Warm and dry NEUROLOGIC:  Alert and oriented x 3 PSYCHIATRIC:  Normal affect   ASSESSMENT:    1. Paroxysmal SVT (supraventricular tachycardia) ([place])   2. Hyperlipidemia LDL goal <100   3. Chronic diastolic heart failure ([place])   4. Lower extremity edema    PLAN:    In order of problems listed above:  pSVT/Palpitations Patient reports worsening palpitations  over the last few months that improved with extra metoprolol. I will increase Toprol-XL to 25mg  daily. We will reassess symptoms at follow-up.  HLD LDL 129 09/2019. She is on Zetia. I will re-check fasting lipid profile.   Chronic HFpEF/LE edema Worsening LLE, she underwent 2 knee surgeries in the last few months and lifestyle has been more sedentary. She has lasix 20mg , but has not been taking it every day. Also reports high salt intake. I recommended compression socks, elg elevation and low salt diet. Prior echo in 2021 showed normal LVSF with G2DD. I will repeat an echo today. Recommend lasix 20mg  for 3 days, then using lasix as needed for swelling.   Disposition: Follow up in 2 month(s) with MD/APP     Signed, Dezirea Mccollister Ninfa Meeker, PA-C  03/24/2021 3:06 PM    [facility]

## 2021-03-24 NOTE — Patient Instructions (Signed)
Medication Instructions:  Your physician has recommended you make the following change in your medication:   INCREASE metoprolol succinate (Toprol XL) to 25 mg daily   Take furosemide (Lasix) 20 mg for THREE DAYS, then resume taking it as needed for swelling   *If you need a refill on your cardiac medications before your next appointment, please call your pharmacy*   Lab Work: Your physician recommends that you return for a FASTING lipid profile: The week prior to your 2 month follow up.  - You will need to be fasting. Please do not have anything to eat or drink after midnight the morning you have the lab work. You may only have water or black coffee with no cream or sugar.   We will call you closer to your appointment time and schedule you in our office for this lab draw.   If you have labs (blood work) drawn today and your tests are completely normal, you will receive your results only by: [place] (if you have MyChart) OR A paper copy in the mail If you have any lab test that is abnormal or we need to change your treatment, we will call you to review the results.   Testing/Procedures:  Echocardiogram - Your physician has requested that you have an echocardiogram. Echocardiography is a painless test that uses sound waves to create images of your heart. It provides your doctor with information about the size and shape of your heart and how well your hearts chambers and valves are working. This procedure takes approximately one hour. There are no restrictions for this procedure.   Follow-Up: At [facility], you and your health needs are our priority.  As part of our continuing mission to provide you with exceptional heart care, we have created designated Provider Care Teams.  These Care Teams include your primary Cardiologist (physician) and Advanced Practice Providers (APPs -  Physician Assistants and Nurse Practitioners) who all work together to provide you with the care you  need, when you need it.  We recommend signing up for the patient portal called "MyChart".  Sign up information is provided on this After Visit Summary.  MyChart is used to connect with patients for Virtual Visits (Telemedicine).  Patients are able to view lab/test results, encounter notes, upcoming appointments, etc.  Non-urgent messages can be sent to your provider as well.   To learn more about what you can do with MyChart, go to [email or web address].    Your next appointment:   2 month(s)  The format for your next appointment:   In Person  Provider:   You may see Nelva Bush, MD or one of the following Advanced Practice Providers on your designated Care Team:   Murray Hodgkins, NP Christell Faith, PA-C Cadence Kathlen Mody, PA-C :1}    Other Instructions N/A

## 2021-03-29 DIAGNOSIS — H524 Presbyopia: Secondary | ICD-10-CM | POA: Diagnosis not present

## 2021-03-29 DIAGNOSIS — H16223 Keratoconjunctivitis sicca, not specified as Sjogren's, bilateral: Secondary | ICD-10-CM | POA: Diagnosis not present

## 2021-04-13 ENCOUNTER — Other Ambulatory Visit: Payer: Self-pay

## 2021-04-13 ENCOUNTER — Ambulatory Visit: Payer: PPO | Admitting: Dermatology

## 2021-04-13 DIAGNOSIS — L738 Other specified follicular disorders: Secondary | ICD-10-CM | POA: Diagnosis not present

## 2021-04-13 DIAGNOSIS — L988 Other specified disorders of the skin and subcutaneous tissue: Secondary | ICD-10-CM

## 2021-04-13 DIAGNOSIS — L821 Other seborrheic keratosis: Secondary | ICD-10-CM

## 2021-04-13 DIAGNOSIS — L219 Seborrheic dermatitis, unspecified: Secondary | ICD-10-CM

## 2021-04-13 MED ORDER — KETOCONAZOLE 2 % EX SHAM: MEDICATED_SHAMPOO | CUTANEOUS | 2 refills | Status: DC

## 2021-04-13 MED ORDER — CLOBETASOL PROPIONATE 0.05 % EX SOLN: CUTANEOUS | 1 refills | Status: DC

## 2021-04-13 NOTE — Progress Notes (Signed)
° °  Follow-Up Visit   Subjective  Andrea Savage is a 76 y.o. female who presents for the following: Check scalp (Itching, burning x 4 weeks. She has tried psoriasis shampoo and TMC 0.1% Cream. Cream helped a little but itch and burn comes right back. ). 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 No new shampoo other than what she tried recently (Dermarest psoriasis shampoo). Metoprolol was increased recently. No new medicines. Patient has had a lot of stress over the past several months due to surgeries. Patient's sister had a history of severe psoriasis.   The following portions of the chart were reviewed this encounter and updated as appropriate:       Review of Systems:  No other skin or systemic complaints except as noted in HPI or Assessment and Plan.  Objective  Well appearing patient in no apparent distress; mood and affect are within normal limits.  A focused examination was performed including face, scalp. Relevant physical exam findings are noted in the Assessment and Plan.  Scalp Erythema with mild scale of the occipital scalp, crown    Assessment & Plan  Seborrheic Keratoses - Stuck-on, waxy, tan-brown papules and/or plaques  - Benign-appearing - Discussed benign etiology and prognosis. - Observe - Call for any changes   Seborrheic dermatitis Scalp  Chronic condition with duration or expected duration over one year. Condition is bothersome to patient. Currently flared.   Start ketoconazole 2% shampoo massage into scalp and let sit several minutes before rinsing dsp 165mL 2Rf. Use 2-3x/wk, may alternate with OTC Psoriasis shampoo with Salicylic acid.   Start Clobetasol solution Apply 2-3 gtts to AA scalp BID prn itch dsp 5mL 1Rf.  Topical steroids (such as triamcinolone, fluocinolone, fluocinonide, mometasone, clobetasol, halobetasol, betamethasone, hydrocortisone) can cause thinning and lightening of the  skin if they are used for too long in the same area. Your physician has selected the right strength medicine for your problem and area affected on the body. Please use your medication only as directed by your physician to prevent side effects.    ketoconazole (NIZORAL) 2 % shampoo - Scalp Massage into scalp and let sit several minutes before rinsing. Use 2-3 times a week.  clobetasol (TEMOVATE) 0.05 % external solution - Scalp Apply a few drops to itchy areas on scalp twice daily until improved. Avoid face.   Sebaceous Hyperplasia and nodular elastosis - Small follicular yellow/white papules eyelids, face, arms - Benign, normal microanatomy more visible due to skin thinning due to age and chronic sun damage - Observe   . Return in about 6 weeks (around 05/25/2021), or f/u seb derm and TBSE.  IJamesetta Orleans, CMA, am acting as scribe for Brendolyn Patty, MD . Documentation: I have reviewed the above documentation for accuracy and completeness, and I agree with the above.  Brendolyn Patty MD

## 2021-04-13 NOTE — Patient Instructions (Addendum)
Topical steroids (such as triamcinolone, fluocinolone, fluocinonide, mometasone, clobetasol, halobetasol, betamethasone, hydrocortisone) can cause thinning and lightening of the skin if they are used for too long in the same area. Your physician has selected the right strength medicine for your problem and area affected on the body. Please use your medication only as directed by your physician to prevent side effects.     Seborrheic Dermatitis  What is seborrheic dermatitis? Seborrheic (say: seb-oh-ree-ick) dermatitis is a disease that causes flaking of the skin.  It usually affects the scalp.  In teenagers and adults, it is commonly called dandruff.  In infants, it is referred to as cradle cap.  Dandruff often appears as scaling on the scalp with or without redness.  On other parts of the body, seborrheic dermatitis tends to produce both redness and scaling.  Other common locations of seborrheic dermatitis include the central face, eyebrows, chest, and the creases of the arms, legs, and groin.  It often causes the skin to look a little greasy, scaly, or flaky. Seborrheic dermatitis can occur at any age.  It often comes and goes and may to be seasonally related, especially in the Northern climates.  What causes seborrheic dermatitis? The exact cause is not known, though yeast of the Malassezia species may be involved.  This organism is normally present on the skin in small numbers, but sometimes its numbers increase, especially in oily skin.  Treatments that reduce the yeast tend to improve seborrheic dermatitis.  How is seborrheic dermatitis treated? The treatment of seborrheic dermatitis depends on its location on the body and the persons age. Seborrheic dermatitis of the scalp (dandruff) in adults and teenagers is usually treated with a medicated shampoo.  Here is a list of the medications that help, and the over-counter shampoos that contain them: Salicylic acid (Neutrogena T/Sal, Sebulex,  Scalpicin, Denorex Extra Strength) Zinc pyrithione (Head & Shoulders white bottle, Denorex Daily, DHS Zinc, Pantene Pro-V Pyrithione Zinc) Selenium sulfide (Head & Shoulders blue bottle, Selsun Blue, Exsel Lotion Shampoo, Glo-Sel) Yahoo tar (Neutrogena T/Gal, Pentrax, Zetar, Tegrin, Viacom, Therapeutic Denorex) Ketoconazole (Nizoral)  If you have dandruff, you might start by using one of these shampoos every day until your dandruff is controlled and then keep using it at least twice a week.  Often times your doctor will recommend a rotation of several different medicated shampoos as some will experience a plateau in the effectiveness of any one shampoo.   When you use a dandruff shampoo, rub the shampoo into your wet hair and massage into scalp thoroughly.  Let it stay on your hair and scalp for 5 minutes before rinsing.  If you have involvement in the eyebrows or face, you can lather those areas with the medicated shampoo as well, or use a medicated soap (ZNP-bar, Polytar Soap, SAStid, or sulfur soap).    If the wash or shampoo alone does not help, your doctor might want you to use a prescription medication once or twice a day.  Leave-in medications for the scalp are best applied by massaging into the scalp immediately after towel drying your hair, but may be applied even if you have not washed your hair.  Seborrheic dermatitis in infants usually clears up by age 4 -69 months.  It may develop in the diaper area where it might be confused with diaper rash.  For milder cases you can try gently brushing out scales with a soft brush.  This is best done immediately after washing with a non-medicated baby  shampoo [name] and Royce Macadamia, etc.).  Your doctor may recommend a medicated shampoo or a prescription topical medication.    Seborrheic Keratosis  What causes seborrheic keratoses? Seborrheic keratoses are harmless, common skin growths that first appear during adult life.  As time goes by, more  growths appear.  Some people may develop a large number of them.  Seborrheic keratoses appear on both covered and uncovered body parts.  They are not caused by sunlight.  The tendency to develop seborrheic keratoses can be inherited.  They vary in color from skin-colored to gray, brown, or even black.  They can be either smooth or have a rough, warty surface.   Seborrheic keratoses are superficial and look as if they were stuck on the skin.  Under the microscope this type of keratosis looks like layers upon layers of skin.  That is why at times the top layer may seem to fall off, but the rest of the growth remains and re-grows.    Treatment Seborrheic keratoses do not need to be treated, but can easily be removed in the office.  Seborrheic keratoses often cause symptoms when they rub on clothing or jewelry.  Lesions can be in the way of shaving.  If they become inflamed, they can cause itching, soreness, or burning.  Removal of a seborrheic keratosis can be accomplished by freezing, burning, or surgery. If any spot bleeds, scabs, or grows rapidly, please return to have it checked, as these can be an indication of a skin cancer.  If You Need Anything After Your Visit  If you have any questions or concerns for your doctor, please call our main line at [phone] and press option 4 to reach your doctor's medical assistant. If no one answers, please leave a voicemail as directed and we will return your call as soon as possible. Messages left after 4 pm will be answered the following business day.   You may also send [place] a message via [place]. We typically respond to MyChart messages within 1-2 business days.  For prescription refills, please ask your pharmacy to contact our office. Our fax number is [phone].  If you have an urgent issue when the clinic is closed that cannot wait until the next business day, you can page your doctor at the number below.    Please note that while we do our best to  be available for urgent issues outside of office hours, we are not available 24/7.   If you have an urgent issue and are unable to reach [place], you may choose to seek medical care at your doctor's office, retail clinic, urgent care center, or emergency room.  If you have a medical emergency, please immediately call 911 or go to the emergency department.  Pager Numbers  - Dr. Nehemiah Massed: [phone]  - Dr. Laurence Ferrari: [phone]  - Dr. Nicole Kindred: [phone]  In the event of inclement weather, please call our main line at [phone] for an update on the status of any delays or closures.  Dermatology Medication Tips: Please keep the boxes that topical medications come in in order to help keep track of the instructions about where and how to use these. Pharmacies typically print the medication instructions only on the boxes and not directly on the medication tubes.   If your medication is too expensive, please contact our office at [phone] option 4 or send [place] a message through [place].   We are unable to tell what your co-pay for medications will be in advance  as this is different depending on your insurance coverage. However, we may be able to find a substitute medication at lower cost or fill out paperwork to get insurance to cover a needed medication.   If a prior authorization is required to get your medication covered by your insurance company, please allow [place] 1-2 business days to complete this process.  Drug prices often vary depending on where the prescription is filled and some pharmacies may offer cheaper prices.  The website www.goodrx.com contains coupons for medications through different pharmacies. The prices here do not account for what the cost may be with help from insurance (it may be cheaper with your insurance), but the website can give you the price if you did not use any insurance.  - You can print the associated coupon and take it with your prescription to the pharmacy.   - You may also stop by our office during regular business hours and pick up a GoodRx coupon card.  - If you need your prescription sent electronically to a different pharmacy, notify our office through [facility] or by phone at [phone] option 4.     Si Usted Necesita Algo Despus de Su Visita  Tambin puede enviarnos un mensaje a travs de Pharmacist, community. Por lo general respondemos a los mensajes de MyChart en el transcurso de 1 a 2 das hbiles.  Para renovar recetas, por favor pida a su farmacia que se ponga en contacto con nuestra oficina. Harland Dingwall de fax es [place] [phone].  Si tiene un asunto urgente cuando la clnica est cerrada y que no puede esperar hasta el siguiente da hbil, puede llamar/localizar a su doctor(a) al nmero que aparece a continuacin.   Por favor, tenga en cuenta que aunque hacemos todo lo posible para estar disponibles para asuntos urgentes fuera del horario de [place], no estamos disponibles las 24 horas del da, los 7 das de la [place].   Si tiene un problema urgente y no puede comunicarse con nosotros, puede optar por buscar atencin mdica  en el consultorio de su doctor(a), en una clnica privada, en un centro de atencin urgente o en una sala de emergencias.  Si tiene Engineering geologist, por favor llame inmediatamente al 911 o vaya a la sala de emergencias.  Nmeros de bper  - Dr. Nehemiah Massed: [phone]  - Dra. Moye: [phone]  - Dra. Nicole Kindred: [phone]  En caso de inclemencias del [place], por favor llame a Johnsie Kindred principal al [phone] para una actualizacin sobre el [place] de cualquier retraso o cierre.  Consejos para la medicacin en dermatologa: Por favor, guarde las cajas en las que vienen los medicamentos de uso tpico para ayudarle a seguir las instrucciones sobre dnde y cmo usarlos. Las farmacias generalmente imprimen las instrucciones del medicamento slo en las cajas y no directamente en los tubos del  [place].   Si su medicamento es muy caro, por favor, pngase en contacto con Zigmund Daniel llamando al [phone] y presione la opcin 4 o envenos un mensaje a travs de Pharmacist, community.   No podemos decirle cul ser su copago por los medicamentos por adelantado ya que esto es diferente dependiendo de la cobertura de su seguro. Sin embargo, es posible que podamos encontrar un medicamento sustituto a Electrical engineer un formulario para que el seguro cubra el medicamento que se considera necesario.   Si se requiere una autorizacin previa para que su compaa de seguros [place] su medicamento, por favor permtanos de 1 a 2 das  hbiles para completar este proceso.  Los precios de los medicamentos varan con frecuencia dependiendo del Environmental consultant de dnde se surte la receta y alguna farmacias pueden ofrecer precios ms baratos.  El sitio web www.goodrx.com tiene cupones para medicamentos de Airline pilot. Los precios aqu no tienen en cuenta lo que podra costar con la ayuda del seguro (puede ser ms barato con su seguro), pero el sitio web puede darle el precio si no utiliz Research scientist (physical sciences).  - Puede imprimir el cupn correspondiente y llevarlo con su receta a la farmacia.  - Tambin puede pasar por nuestra oficina durante el horario de atencin regular y Charity fundraiser una tarjeta de cupones de GoodRx.  - Si necesita que su receta se enve electrnicamente a una farmacia diferente, informe a nuestra oficina a travs de MyChart de [place] o por telfono llamando al [phone] y presione la opcin 4.

## 2021-04-27 DIAGNOSIS — M25561 Pain in right knee: Secondary | ICD-10-CM | POA: Diagnosis not present

## 2021-04-27 DIAGNOSIS — Z96651 Presence of right artificial knee joint: Secondary | ICD-10-CM | POA: Diagnosis not present

## 2021-04-29 DIAGNOSIS — Z96651 Presence of right artificial knee joint: Secondary | ICD-10-CM | POA: Diagnosis not present

## 2021-04-29 DIAGNOSIS — M25561 Pain in right knee: Secondary | ICD-10-CM | POA: Diagnosis not present

## 2021-05-04 DIAGNOSIS — M25561 Pain in right knee: Secondary | ICD-10-CM | POA: Diagnosis not present

## 2021-05-04 DIAGNOSIS — Z96651 Presence of right artificial knee joint: Secondary | ICD-10-CM | POA: Diagnosis not present

## 2021-05-07 DIAGNOSIS — Z96651 Presence of right artificial knee joint: Secondary | ICD-10-CM | POA: Diagnosis not present

## 2021-05-11 DIAGNOSIS — M25561 Pain in right knee: Secondary | ICD-10-CM | POA: Diagnosis not present

## 2021-05-11 DIAGNOSIS — Z96651 Presence of right artificial knee joint: Secondary | ICD-10-CM | POA: Diagnosis not present

## 2021-05-13 DIAGNOSIS — Z96651 Presence of right artificial knee joint: Secondary | ICD-10-CM | POA: Diagnosis not present

## 2021-05-13 DIAGNOSIS — M25561 Pain in right knee: Secondary | ICD-10-CM | POA: Diagnosis not present

## 2021-05-20 DIAGNOSIS — M25561 Pain in right knee: Secondary | ICD-10-CM | POA: Diagnosis not present

## 2021-05-20 DIAGNOSIS — Z96651 Presence of right artificial knee joint: Secondary | ICD-10-CM | POA: Diagnosis not present

## 2021-05-24 ENCOUNTER — Other Ambulatory Visit (INDEPENDENT_AMBULATORY_CARE_PROVIDER_SITE_OTHER): Payer: PPO

## 2021-05-24 ENCOUNTER — Other Ambulatory Visit: Payer: Self-pay

## 2021-05-24 ENCOUNTER — Ambulatory Visit (INDEPENDENT_AMBULATORY_CARE_PROVIDER_SITE_OTHER): Payer: PPO

## 2021-05-24 DIAGNOSIS — I471 Supraventricular tachycardia: Secondary | ICD-10-CM

## 2021-05-24 DIAGNOSIS — E785 Hyperlipidemia, unspecified: Secondary | ICD-10-CM | POA: Diagnosis not present

## 2021-05-24 LAB — ECHOCARDIOGRAM COMPLETE
AR max vel: 2.89 cm2
AV Area VTI: 3.02 cm2
AV Area mean vel: 2.86 cm2
AV Mean grad: 3 mmHg
AV Peak grad: 5.9 mmHg
AV Vena cont: 0.5 cm
Ao pk vel: 1.21 m/s
Area-P 1/2: 3.26 cm2
Calc EF: 67.5 %
MV M vel: 6.45 m/s
MV Peak grad: 166.2 mmHg
Radius: 0.4 cm
S' Lateral: 1.5 cm
Single Plane A2C EF: 67.6 %
Single Plane A4C EF: 67.4 %

## 2021-05-25 ENCOUNTER — Telehealth: Payer: Self-pay | Admitting: *Deleted

## 2021-05-25 DIAGNOSIS — Z96651 Presence of right artificial knee joint: Secondary | ICD-10-CM | POA: Diagnosis not present

## 2021-05-25 DIAGNOSIS — M25561 Pain in right knee: Secondary | ICD-10-CM | POA: Diagnosis not present

## 2021-05-25 LAB — LIPID PANEL
Chol/HDL Ratio: 3 ratio (ref 0.0–4.4)
Cholesterol, Total: 178 mg/dL (ref 100–199)
HDL: 59 mg/dL (ref >39)
LDL Chol Calc (NIH): 102 mg/dL — ABNORMAL HIGH (ref 0–99)
Triglycerides: 95 mg/dL (ref 0–149)
VLDL Cholesterol Cal: 17 mg/dL (ref 5–40)

## 2021-05-25 NOTE — Telephone Encounter (Signed)
Attempted to call pt to review echo and lab results. See also lab result note: ? ?Kathlen Mody, Cadence H, PA-C  Mila Merry, RN ?LDL is better on Zetia, 129>102  ? ?No answer at this time. Lmtcb.  ?

## 2021-05-25 NOTE — Telephone Encounter (Signed)
-----   Message from Harleigh, PA-C sent at 05/25/2021  1:51 PM EDT ----- ?Echo showed normal pump function and mild to moderate leaky mitral valve. We can follow this annually with repeat echo.  ?Can we ask if leg edema improved with the echo? ?

## 2021-05-25 NOTE — Telephone Encounter (Addendum)
Pt has been made aware of both echo and lab results below.  ?Pt voiced understanding and has no further questions.  ?Pt reports that her leg edema has improved. ? ?Pt will follow up as scheduled 05/28/21.  ?

## 2021-05-26 ENCOUNTER — Other Ambulatory Visit: Payer: Self-pay | Admitting: Internal Medicine

## 2021-05-26 DIAGNOSIS — Z1231 Encounter for screening mammogram for malignant neoplasm of breast: Secondary | ICD-10-CM

## 2021-05-27 DIAGNOSIS — M25561 Pain in right knee: Secondary | ICD-10-CM | POA: Diagnosis not present

## 2021-05-27 DIAGNOSIS — Z96651 Presence of right artificial knee joint: Secondary | ICD-10-CM | POA: Diagnosis not present

## 2021-05-28 ENCOUNTER — Ambulatory Visit: Payer: PPO | Admitting: Medical

## 2021-05-28 ENCOUNTER — Ambulatory Visit
Admission: RE | Admit: 2021-05-28 | Discharge: 2021-05-28 | Disposition: A | Discharge status: Home / Self Care | Payer: PPO | Source: Ambulatory Visit | Attending: Internal Medicine | Admitting: Internal Medicine

## 2021-05-28 ENCOUNTER — Other Ambulatory Visit: Payer: Self-pay | Admitting: Internal Medicine

## 2021-05-28 ENCOUNTER — Other Ambulatory Visit: Payer: Self-pay

## 2021-05-28 ENCOUNTER — Encounter: Payer: Self-pay | Admitting: Medical

## 2021-05-28 VITALS — BP 120/70 | HR 67 | Ht 64.5 in | Wt 162.5 lb

## 2021-05-28 DIAGNOSIS — Z1231 Encounter for screening mammogram for malignant neoplasm of breast: Secondary | ICD-10-CM | POA: Diagnosis not present

## 2021-05-28 DIAGNOSIS — E782 Mixed hyperlipidemia: Secondary | ICD-10-CM

## 2021-05-28 DIAGNOSIS — R928 Other abnormal and inconclusive findings on diagnostic imaging of breast: Secondary | ICD-10-CM

## 2021-05-28 DIAGNOSIS — I10 Essential (primary) hypertension: Secondary | ICD-10-CM

## 2021-05-28 DIAGNOSIS — I059 Rheumatic mitral valve disease, unspecified: Secondary | ICD-10-CM

## 2021-05-28 DIAGNOSIS — I5032 Chronic diastolic (congestive) heart failure: Secondary | ICD-10-CM | POA: Diagnosis not present

## 2021-05-28 DIAGNOSIS — R600 Localized edema: Secondary | ICD-10-CM

## 2021-05-28 NOTE — Patient Instructions (Signed)
Medication Instructions:  ? ?Your physician recommends that you continue on your current medications as directed. Please refer to the Current Medication list given to you today.  ? ?*If you need a refill on your cardiac medications before your next appointment, please call your pharmacy* ? ? ?Lab Work: ?None ordered ? ?If you have labs (blood work) drawn today and your tests are completely normal, you will receive your results only by: ?MyChart Message (if you have MyChart) OR ?A paper copy in the mail ?If you have any lab test that is abnormal or we need to change your treatment, we will call you to review the results. ? ? ?Testing/Procedures: ?None ordered ? ? ?Follow-Up: ?At [facility], you and your health needs are our priority.  As part of our continuing mission to provide you with exceptional heart care, we have created designated Provider Care Teams.  These Care Teams include your primary Cardiologist (physician) and Advanced Practice Providers (APPs -  Physician Assistants and Nurse Practitioners) who all work together to provide you with the care you need, when you need it. ? ?We recommend signing up for the patient portal called "MyChart".  Sign up information is provided on this After Visit Summary.  MyChart is used to connect with patients for Virtual Visits (Telemedicine).  Patients are able to view lab/test results, encounter notes, upcoming appointments, etc.  Non-urgent messages can be sent to your provider as well.   ?To learn more about what you can do with MyChart, go to [email or web address].   ? ?Your next appointment:   ?6 month(s) ? ?The format for your next appointment:   ?In Person ? ?Provider:   ?You may see Nelva Bush, MD or one of the following Advanced Practice Providers on your designated Care Team:   ?Murray Hodgkins, NP ?Christell Faith, PA-C ?Cadence Kathlen Mody, PA-C ? ? ?Other Instructions ?N/A ?

## 2021-05-28 NOTE — Progress Notes (Signed)
?Cardiology Office Note:   ? ?Date:  05/28/2021  ? ?ID:  Andrea Savage, DOB 05-07-1945, MRN [id number] ? ?PCP:  [name], MD  ?[facility] Cardiologist:  Nelva Bush, MD  ?[facility] Electrophysiologist:  None  ? ?Referring MD: [name], MD  ? ?Chief Complaint: 2 month follow-up ? ?History of Present Illness:   ? ?Andrea Savage is a 76 y.o. female with a hx of with a hx of with a hx of HLD, HFpEF, palpitations/PSVT, recurrent syncope/near syncope, possible asthma, fibromyalgia, depression, postherpetic neuralgia  presents today for 6 month follow-up.  ?  ?Echo 09/2017 with normal LVEF and mild MR. Lexiscan myoview 09/2017 low risk study with no evidence of ischemia . Long term monitor 10/2017 with rare PAC/PVC and several episodes of PSVT. She was subsequently started on Metoprolol with improvement.  ?  ?Echo 06/2019 with LVEF 60-65%, gr2DD, mild to moderate MR, mildly elevated PASP, and RA pressure 73mHg. She was started on Lasix '20mg'$  daily. Lower extremity ABI ordered due to RLE weakness with results 06/27/19 with bilateral LE ABI normal.  ?  ?She has previously been on Crestor '40mg'$  daily with reported myalgias, this was stopped. She was initially on Zetia though transitioned to NConversefor optimal lipid control.  However this was cost prohibitive did not resume Zetia.    ?  ?seen 06/2020 and reported b/l ankle edema on lasix. Zetia was started. ? ?She underwent right knee replacement 12/2020. Two months before that she had meniscus removed. ? ?Last seen 03/24/21 and reported more frequent palpitations and LLE. Metoprolol was increased for better symptom control. With worsening LLE echo was ordered and lasix '20mg'$  was recommended for 3 days.  ? ?Echo showed normal LVEF G1DD and mild-moderate MR.  ?  ?Today, the echo was reviewed in detail. She is still in PT for the right knee. She developed tendonitis in the knee and had to go back to therapy. She is slowly getting her strength  back. She reports lower leg swelling is much better. She is not needing lasix often. Patient denies any further palpitations. BP is good today. Overall almost feels back to normal.  ? ?Past Medical History:  ?Diagnosis Date  ? (HFpEF) heart failure with preserved ejection fraction ([place]   ? Anxiety   ? Asthma   ? Depression   ? Fibromyalgia   ? GERD (gastroesophageal reflux disease)   ? Grade II diastolic dysfunction   ? H/O scarlet fever   ? as child  ? Heart murmur   ? s/p scarlet fever as child  ? HLD (hyperlipidemia)   ? HTN (hypertension)   ? IBS (irritable bowel syndrome)   ? Low bone density   ? Mitral regurgitation   ? OA (osteoarthritis)   ? Osteopenia   ? Palpitations   ? PHN (postherpetic neuralgia)   ? PSVT (paroxysmal supraventricular tachycardia) ([place]   ? Recurrent syncope   ? Rosacea   ? Seiz[place] ([place]  ? 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 Wears hearing aid in both ears   ? ? ?Past Surgical History:  ?Procedure Laterality Date  ? ABDOMINAL HYSTERECTOMY  2009  ? Total  ? BREAST EXCISIONAL BIOPSY Left 1979  ? NEG  ? COLONOSCOPY  [id number] ? repeat 10 years  ? COLONOSCOPY WITH PROPOFOL N/A 03/24/2020  ? Procedure: COLONOSCOPY WITH PROPOFOL;  Surgeon: LLesly Rubenstein MD;  [facility];  Service: Endoscopy;  Laterality: N/A;  ? ESOPHAGOGASTRODUODENOSCOPY (EGD) WITH PROPOFOL  N/A 03/24/2020  ? Procedure: ESOPHAGOGASTRODUODENOSCOPY (EGD) WITH PROPOFOL;  Surgeon: Lesly Rubenstein, MD;  Location: [facility] ENDOSCOPY;  Service: Endoscopy;  Laterality: N/A;  ? EXCISION MORTON'S NEUROMA Left 09/21/2017  ? Procedure: EXCISION MORTON'S NEUROMA;  Surgeon: Albertine Patricia, DPM;  Location: [place];  Service: Podiatry;  Laterality: Left;  ? FOOT SURGERY Right 1990  ? HAND SURGERY Right   ? KNEE ARTHROPLASTY Right 12/14/2020  ? Procedure: COMPUTER ASSISTED TOTAL KNEE ARTHROPLASTY;  Surgeon: Dereck Leep, MD;  Location: [facility] ORS;  Service: Orthopedics;  Laterality: Right;  ? KNEE  ARTHROSCOPY Right 10/14/2020  ? Procedure: ARTHROSCOPY KNEE, LATERAL MENISECTOMY;  Surgeon: Dereck Leep, MD;  Location: [facility] ORS;  Service: Orthopedics;  Laterality: Right;  ? LAPAROSCOPIC OOPHERECTOMY    ? MINOR HARDWARE REMOVAL Left 11/22/2018  ? Procedure: REMOVAL SCREW;DEEP 2nd and 3RD LEFT CAPSULE RELEASE OF THIRD METATARSAL PHALANGEAL JOINT LEFT, TENDON LENGTHING THIRD TOE LEFT AND CORTISONE INJECTION LEFT;  Surgeon: Albertine Patricia, DPM;  Location: [place];  Service: Podiatry;  Laterality: Left;  LMA OR IVA LOCAL  ? TONSILLECTOMY    ? TONSILLECTOMY    ? WEIL OSTEOTOMY Left 09/21/2017  ? Procedure: WEIL OSTEOTOMY-2ND & 3RD TOES;  Surgeon: Albertine Patricia, DPM;  Location: [place];  Service: Podiatry;  Laterality: Left;  LMA WITH LOCAL ?MINI MONSTER ?DR Elvina Mattes WANTS TED HOSE ON PATIENT'S RIGHT LEG  ? ? ?Current Medications: ?Current Meds  ?Medication Sig  ? acetaminophen (TYLENOL) 325 MG tablet Take by mouth.  ? albuterol (VENTOLIN HFA) 108 (90 Base) MCG/ACT inhaler Inhale 2 puffs into the lungs every 6 (six) hours as needed for wheezing or shortness of breath.  ? busPIRone (BUSPAR) 5 MG tablet Take 1 tablet (5 mg total) by mouth daily. (Patient taking differently: Take 5 mg by mouth 2 (two) times daily as needed (anxiety).)  ? carboxymethylcellulose (REFRESH PLUS) 0.5 % SOLN Place 1 drop into both eyes as needed (dry eyes).  ? citalopram (CELEXA) 20 MG tablet Take 20 mg by mouth daily.  ? clobetasol (TEMOVATE) 0.05 % external solution Apply a few drops to itchy areas on scalp twice daily until improved. Avoid face.  ? EPINEPHrine 0.3 mg/0.3 mL IJ SOAJ injection Inject 0.3 mg into the muscle as needed for anaphylaxis.  ? ezetimibe (ZETIA) 10 MG tablet Take 1 tablet (10 mg total) by mouth daily.  ? fluticasone (FLOVENT HFA) 220 MCG/ACT inhaler Inhale into the lungs.  ? furosemide (LASIX) 20 MG tablet Take 1 tablet (20 mg total) by mouth daily. (Patient taking differently: Take 20 mg by  mouth daily as needed for edema.)  ? ketoconazole (NIZORAL) 2 % shampoo Massage into scalp and let sit several minutes before rinsing. Use 2-3 times a week.  ? loratadine (CLARITIN) 10 MG tablet Take 10 mg by mouth daily.  ? metoprolol succinate (TOPROL-XL) 25 MG 24 hr tablet Take 1 tablet (25 mg total) by mouth daily.  ? NON FORMULARY GBX BURN 2 capsules daily.  ? NON FORMULARY GBX Fit daily.  ? NON FORMULARY Menta Heart 2 Softgels daily.  ? omeprazole (PRILOSEC) 40 MG capsule Take 40 mg by mouth in the morning and at bedtime.  ? OVER THE COUNTER MEDICATION Take 1 capsule by mouth at bedtime. Amare Mood +  ? OVER THE COUNTER MEDICATION Take 1 capsule by mouth daily. Amare Focus  ? traMADol (ULTRAM) 50 MG tablet Take 1 tablet (50 mg total) by mouth every 4 (four) hours as needed for  moderate pain.  ? [DISCONTINUED] chlorpheniramine-HYDROcodone ([place]) 10-8 MG/5ML SUER Take by mouth.  ? [DISCONTINUED] OVER THE COUNTER MEDICATION Take 1 capsule by mouth daily. [place] [facility]  ?  ? ?Allergies:   Compazine [prochlorperazine], Dust mite extract, and Gabapentin  ? ?Social History  ? ?Socioeconomic History  ? Marital status: Married  ?  Spouse name: Not on file  ? Number of children: Not on file  ? Years of education: Not on file  ? Highest education level: Some college, no degree  ?Occupational History  ? Occupation: retired  ?Tobacco Use  ? Smoking status: Never  ? Smokeless tobacco: Never  ?Vaping Use  ? Vaping Use: Never used  ?Substance and Sexual Activity  ? Alcohol use: Yes  ?  Alcohol/week: 1.0 standard drink  ?  Types: 1 Glasses of wine per week  ?  Comment: on occasion  ? Drug use: No  ? Sexual activity: Not Currently  ?Other Topics Concern  ? Not on file  ?Social History Narrative  ? Pt lives in 2 story home with her husband, [name]  ? Has 2 adult children  ? Some college education  ? 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 ?Social Determinants of Health  ? ?Financial Resource Strain: Not on file  ?Food  Insecurity: Not on file  ?Transportation Needs: Not on file  ?Physical Activity: Not on file  ?Stress: Not on file  ?Social Connections: Not on file  ?  ? ?Family History: ?The patient's family history includes

## 2021-05-31 ENCOUNTER — Other Ambulatory Visit: Payer: Self-pay

## 2021-05-31 ENCOUNTER — Ambulatory Visit: Payer: PPO | Admitting: Dermatology

## 2021-05-31 ENCOUNTER — Encounter: Payer: Self-pay | Admitting: Dermatology

## 2021-05-31 DIAGNOSIS — L814 Other melanin hyperpigmentation: Secondary | ICD-10-CM

## 2021-05-31 DIAGNOSIS — L738 Other specified follicular disorders: Secondary | ICD-10-CM | POA: Diagnosis not present

## 2021-05-31 DIAGNOSIS — D1800 Hemangioma unspecified site: Secondary | ICD-10-CM

## 2021-05-31 DIAGNOSIS — L2081 Atopic neurodermatitis: Secondary | ICD-10-CM | POA: Diagnosis not present

## 2021-05-31 DIAGNOSIS — L820 Inflamed seborrheic keratosis: Secondary | ICD-10-CM

## 2021-05-31 DIAGNOSIS — Z1283 Encounter for screening for malignant neoplasm of skin: Secondary | ICD-10-CM | POA: Diagnosis not present

## 2021-05-31 DIAGNOSIS — Z79899 Other long term (current) drug therapy: Secondary | ICD-10-CM

## 2021-05-31 DIAGNOSIS — L821 Other seborrheic keratosis: Secondary | ICD-10-CM | POA: Diagnosis not present

## 2021-05-31 DIAGNOSIS — L578 Other skin changes due to chronic exposure to nonionizing radiation: Secondary | ICD-10-CM

## 2021-05-31 DIAGNOSIS — D229 Melanocytic nevi, unspecified: Secondary | ICD-10-CM

## 2021-05-31 DIAGNOSIS — L219 Seborrheic dermatitis, unspecified: Secondary | ICD-10-CM | POA: Diagnosis not present

## 2021-05-31 MED ORDER — CLOBETASOL PROPIONATE 0.05 % EX SOLN: 1.0000 "application " | Freq: Two times a day (BID) | CUTANEOUS | 2 refills | Status: DC

## 2021-05-31 MED ORDER — CLOBETASOL PROPIONATE 0.05 % EX SHAM: 1.0000 "application " | MEDICATED_SHAMPOO | CUTANEOUS | 3 refills | Status: DC

## 2021-05-31 MED ORDER — DUPIXENT 300 MG/2ML ~~LOC~~ SOSY: 600.0000 mg | PREFILLED_SYRINGE | Freq: Once | SUBCUTANEOUS | 0 refills | Status: AC

## 2021-05-31 MED ORDER — DUPIXENT 300 MG/2ML ~~LOC~~ SOSY: 300.0000 mg | PREFILLED_SYRINGE | SUBCUTANEOUS | 6 refills | Status: DC

## 2021-05-31 NOTE — Patient Instructions (Addendum)
Eczema Skin Care ? ?Buy TWO 16oz jars of CeraVe moisturizing cream ? [facility], [facility], [facility] (no prescription needed) ? Costs about $15 per jar  ? ?Jar #1: Use as a moisturizer as needed. Can be applied to any area of the body. Use twice daily to unaffected areas. ? ?Jar #2: Pour one 74m bottle of clobetasol 0.05% solution into jar, mix well. Label this jar to indicate the medication has been added. Use twice daily to affected areas. Do not apply to face, groin or underarms. ? ?Moisturizer may burn or sting initially. Try for at least 4 weeks.   ? ? ? ?If You Need Anything After Your Visit ? ?If you have any questions or concerns for your doctor, please call our main line at [phone] press option 4 to reach your doctor's medical assistant. If no one answers, please leave a voicemail as directed and we will return your call as soon as possible. Messages left after 4 pm will be answered the following business day.  ? ?You may also send [place] message via MyChart. We typically respond to MyChart messages within 1-2 business days. ? ?For prescription refills, please ask your pharmacy to contact our office. Our fax number is [phone] ? ?If you have an urgent issue when the clinic is closed that cannot wait until the next business day, you can page your doctor at the number below.   ? ?Please note that while we do our best to be available for urgent issues outside of office hours, we are not available 24/7.  ? ?If you have an urgent issue and are unable to reach [place] you may choose to seek medical care at your doctor's office, retail clinic, urgent care center, or emergency room. ? ?If you have a medical emergency, please immediately call 911 or go to the emergency department. ? ?Pager Numbers ? ?- Dr. KNehemiah Massed [phone]? ?- Dr. MLaurence Ferrari [phone]? ?- Dr. SNicole Kindred [phone]? ?In the event of inclement weather, please call our main line at [phone] an update on the status of any delays or  closures. ? ?Dermatology Medication Tips: ?Please keep the boxes that topical medications come in in order to help keep track of the instructions about where and how to use these. Pharmacies typically print the medication instructions only on the boxes and not directly on the medication tubes.  ? ?If your medication is too expensive, please contact our office at [phone] 4 or send [place] message through [facility]  ? ?We are unable to tell what your co-pay for medications will be in advance as this is different depending on your insurance coverage. However, we may be able to find a substitute medication at lower cost or fill out paperwork to get insurance to cover a needed medication.  ? ?If a prior authorization is required to get your medication covered by your insurance company, please allow [place]-2 business days to complete this process. ? ?Drug prices often vary depending on where the prescription is filled and some pharmacies may offer cheaper prices. ? ?The website www.goodrx.com contains coupons for medications through different pharmacies. The prices here do not account for what the cost may be with help from insurance (it may be cheaper with your insurance), but the website can give you the price if you did not use any insurance.  ?- You can print the associated coupon and take it with your prescription to the pharmacy.  ?- You may also stop by our  office during regular business hours and pick up a GoodRx coupon card.  ?- If you need your prescription sent electronically to a different pharmacy, notify our office through [facility] or by phone at [phone] option 4. ? ? ? ? ?Si Usted Necesita Algo Despu?s de Su Visita ? ?Tambi?n puede enviarnos un mensaje a trav?s de MyChart. Por lo general respondemos a los mensajes de MyChart en el transcurso de 1 a 2 d?as h?biles. ? ?Para renovar recetas, por favor pida a su farmacia que se ponga en contacto con nuestra oficina. Nuestro n?mero de fax  es el [phone]. ? ?Si tiene un asunto urgente cuando la cl?nica est? cerrada y que no puede esperar hasta el siguiente d?a h?bil, puede llamar/localizar a su doctor(a) al n?mero que aparece a continuaci?n.  ? ?Por favor, tenga en cuenta que aunque hacemos todo lo posible para estar disponibles para asuntos urgentes fuera del horario de oficina, no estamos disponibles las 24 horas del d?a, los 7 d?as de la semana.  ? ?Si tiene un problema urgente y no puede comunicarse con nosotros, puede optar por buscar atenci?n m?dica  en el consultorio de su doctor(a), en una cl?nica privada, en un centro de atenci?n urgente o en una sala de emergencias. ? ?Si tiene Engineer, maintenance (IT) m?dica, por favor llame inmediatamente al 911 o vaya a la sala de emergencias. ? ?N?meros de b?per ? ?- Dr. Nehemiah Massed: [phone] ? ?- Dra. Moye: [phone] ? ?- Dra. Nicole Kindred: [phone] ? ?En caso de inclemencias del tiempo, por favor llame a nuestra l?nea principal al [phone] para una actualizaci?n sobre el estado de cualquier retraso o cierre. ? ?Consejos para la medicaci?n en dermatolog?a: ?Por favor, guarde las cajas en las que vienen los medicamentos de uso t?pico para ayudarle a seguir las instrucciones sobre d?nde y c?mo usarlos. Las farmacias generalmente imprimen las instrucciones del medicamento s?lo en las cajas y no directamente en los tubos del [place].  ? ?Si su medicamento es muy caro, por favor, p?ngase en contacto con Zigmund Daniel llamando al [phone] y presione la opci?n 4 o env?enos un mensaje a trav?s de MyChart.  ? ?No podemos decirle cu?l ser? su copago por los medicamentos por adelantado ya que esto es diferente dependiendo de la cobertura de su seguro. Sin embargo, es posible que podamos encontrar un medicamento sustituto a Electrical engineer un formulario para que el seguro cubra el medicamento que se considera necesario.  ? ?Si se requiere Ardelia Mems autorizaci?n previa para que su compa??a de seguros [place]  su medicamento, por favor perm?tanos de 1 a 2 d?as h?biles para completar este proceso. ? ?Los precios de los medicamentos var?an con frecuencia dependiendo del Environmental consultant de d?nde se surte la receta y alguna farmacias pueden ofrecer precios m?s baratos. ? ?El sitio web www.goodrx.com tiene cupones para medicamentos de Airline pilot. Los precios aqu? no tienen en cuenta lo que podr?a costar con la ayuda del seguro (puede ser m?s barato con su seguro), pero el sitio web puede darle el precio si no utiliz? ning?n seguro.  ?- Puede imprimir el cup?n correspondiente y llevarlo con su receta a la farmacia.  ?- Tambi?n puede pasar por nuestra oficina durante el horario de atenci?n regular y recoger una tarjeta de cupones de GoodRx.  ?- Si necesita que su receta se env?e electr?nicamente a Chiropodist, informe a nuestra oficina a trav?s de MyChart de [place] o por tel?fono llamando al [phone] y presione la opci?n 4.  ?

## 2021-05-31 NOTE — Progress Notes (Signed)
? ?Follow-Up Visit ?  ?Subjective  ?Andrea Savage is a 76 y.o. female who presents for the following: Seborrheic Dermatitis (Scalp,6wk f/u, scalp itchy and burning, Ketoconazole 2% shampoo 3x/wk , Clobetasol solution qd), Total body skin exam, and Rash (Arms, back, itchy, started ~6wks ago when started Ketoconazole 2% shampoo, Pt has been taking Amare supplements GBX Fit, GBX Burn, Menta Heart, Menta Focus, Restore for over 1 yr, pt started Benadryl since rash started, TMC cream, hx of Asthma, and allergies, has used Clobetasol cream in past). Also used Clobetasol/CeraVe mix in past. She has had problems with itchy rashes body and scalp off and on for several years. ? ?The patient presents for Total-Body Skin Exam (TBSE) for skin cancer screening and mole check.  The patient has spots, moles and lesions to be evaluated, some may be new or changing and the patient has concerns that these could be cancer. She has a few itchy spots on legs. ? ?The following portions of the chart were reviewed this encounter and updated as appropriate:  ?  ?  ? ?Review of Systems:  No other skin or systemic complaints except as noted in HPI or Assessment and Plan. ? ?Objective  ?Well appearing patient in no apparent distress; mood and affect are within normal limits. ? ?A full examination was performed including scalp, head, eyes, ears, nose, lips, neck, chest, axillae, abdomen, back, buttocks, bilateral upper extremities, bilateral lower extremities, hands, feet, fingers, toes, fingernails, and toenails. All findings within normal limits unless otherwise noted below. ? ?R lower knee x 1, L post thigh x 1, L hip (3) ?Stuck on waxy paps with erythema ? ?Scalp ?Diffuse erythema scalp ? ?back, arms, legs ?Pink scaly patches and paps back, arms, L post thigh ? ? ? ?Assessment & Plan  ?Inflamed seborrheic keratosis (3) ?R lower knee x 1, L post thigh x 1, L hip ? ?Destruction of lesion - R lower knee x 1, L post thigh x 1, L  hip ? ?Destruction method: cryotherapy   ?Informed consent: discussed and consent obtained   ?Lesion destroyed using liquid nitrogen: Yes   ?Region frozen until ice ball extended beyond lesion: Yes   ?Outcome: patient tolerated procedure well with no complications   ?Post-procedure details: wound care instructions given   ?Additional details:  Prior to procedure, discussed risks of blister formation, small wound, skin dyspigmentation, or rare scar following cryotherapy. Recommend Vaseline ointment to treated areas while healing.  ? ?Seborrheic dermatitis ?Scalp ? ?Vrs Atopic Dermatitis, with pruritus ? ?Seborrheic Dermatitis  ?-  is a chronic persistent rash characterized by pinkness and scaling most commonly of the mid face but also can occur on the scalp (dandruff), ears; mid chest, mid back and groin.  It tends to be exacerbated by stress and cooler weather.  People who have neurologic disease may experience new onset or exacerbation of existing seborrheic dermatitis.  The condition is not curable but treatable and can be controlled. ? ?D/c Ketoconazole 2% shampoo ?Start Clobetasol shampoo qod, apply to dry scalp, let sit 20 minutes and wash out, do this for a month, then decrease to 1x/wk ? ?Cont Clobetasol sol 1-2gtts to aa scalp on days she is not using shampoo ? ?Topical steroids (such as triamcinolone, fluocinolone, fluocinonide, mometasone, clobetasol, halobetasol, betamethasone, hydrocortisone) can cause thinning and lightening of the skin if they are used for too long in the same area. Your physician has selected the right strength medicine for your problem and area affected on the  body. Please use your medication only as directed by your physician to prevent side effects.   ? ?Clobetasol Propionate 0.05 % shampoo - Scalp ?Apply 1 application. topically 3 (three) times a week. Apply to dry scalp, let sit 20 minutes and wash out q shampoo for 1 month, then decrease to 1 time weekly ? ?Related  Medications ?ketoconazole (NIZORAL) 2 % shampoo ?Massage into scalp and let sit several minutes before rinsing. Use 2-3 times a week. ? ?clobetasol (TEMOVATE) 0.05 % external solution ?Apply a few drops to itchy areas on scalp twice daily until improved. Avoid face. ? ?Atopic neurodermatitis ?back, arms, legs ? ?Chronic and persistent condition with duration or expected duration over one year. Condition is bothersome/symptomatic for patient. Currently flared.  ? ?Atopic dermatitis (eczema) is a chronic, relapsing, pruritic condition that can significantly affect quality of life. It is often associated with allergic rhinitis and/or asthma and can require treatment with topical medications, phototherapy, or in severe cases biologic injectable medication (Dupixent; Adbry) or Oral JAK inhibitors.  ? ?Restart Clobetasol/cerave mix bid to aa itchy rash until clear, then prn flares ?Consider patch testing once rash cleared (reaction to ketoconazole shampoo?) ? ?Discussed Dupixent ?Pending approval start Dupixent '300mg'$ /40m sq injections qowk ? ?Topical steroids (such as triamcinolone, fluocinolone, fluocinonide, mometasone, clobetasol, halobetasol, betamethasone, hydrocortisone) can cause thinning and lightening of the skin if they are used for too long in the same area. Your physician has selected the right strength medicine for your problem and area affected on the body. Please use your medication only as directed by your physician to prevent side effects.   ? ?Dupilumab (Dupixent) is a treatment given by injection for adults and children with moderate-to-severe atopic dermatitis. Goal is control of skin condition, not cure. It is given as 2 injections at the first dose followed by 1 injection ever 2 weeks thereafter.  Young children are dosed monthly. ? ?Potential side effects include allergic reaction, herpes infections, injection site reactions and conjunctivitis (inflammation of the eyes).  The use of Dupixent  requires long term medication management, including periodic office visits.  ? ?clobetasol (TEMOVATE) 0.05 % external solution - back, arms, legs ?Apply 1 application. topically 2 (two) times daily. Mix whole bottle of clobetasol solution in 1 tub of Cerave cream, use bid to itchy rash on body until clear ? ?dupilumab (DUPIXENT) 300 MG/2ML prefilled syringe - back, arms, legs ?Inject 600 mg into the skin once for 1 dose. On day 1. ? ?dupilumab (DUPIXENT) 300 MG/2ML prefilled syringe - back, arms, legs ?Inject 300 mg into the skin every 14 (fourteen) days. Starting at day 15 for maintenance. ? ? ?Lentigines ?- Scattered tan macules ?- Due to sun exposure ?- Benign-appearing, observe ?- Recommend daily broad spectrum sunscreen SPF 30+ to sun-exposed areas, reapply every 2 hours as needed. ?- Call for any changes ?- arms, trunk ? ?Seborrheic Keratoses ?- Stuck-on, waxy, tan-brown papules and/or plaques  ?- Benign-appearing ?- Discussed benign etiology and prognosis. ?- Observe ?- Call for any changes ?- arms, trunk ? ?Melanocytic Nevi ?- Tan-brown and/or pink-flesh-colored symmetric macules and papules ?- Benign appearing on exam today ?- Observation ?- Call clinic for new or changing moles ?- Recommend daily use of broad spectrum spf 30+ sunscreen to sun-exposed areas.  ? ?Hemangiomas ?- Red papules ?- Discussed benign nature ?- Observe ?- Call for any changes ?- trunk ? ?Actinic Damage ?- Chronic condition, secondary to cumulative UV/sun exposure ?- diffuse scaly erythematous macules with underlying dyspigmentation ?- Recommend  daily broad spectrum sunscreen SPF 30+ to sun-exposed areas, reapply every 2 hours as needed.  ?- Staying in the shade or wearing long sleeves, sun glasses (UVA+UVB protection) and wide brim hats (4-inch brim around the entire circumference of the hat) are also recommended for sun protection.  ?- Call for new or changing lesions. ? ?Skin cancer screening performed today. ? ?Sebaceous  Hyperplasia ?- Small yellow papules with a central dell face ?- Benign ?- Observe  ? ?Return in about 1 month (around 07/01/2021) for Atopic derm. ? ?I, Othelia Pulling, RMA, am acting as scribe for Brendolyn Patty, MD . ? ?Documentation: I have

## 2021-06-01 DIAGNOSIS — Z96651 Presence of right artificial knee joint: Secondary | ICD-10-CM | POA: Diagnosis not present

## 2021-06-03 DIAGNOSIS — Z96651 Presence of right artificial knee joint: Secondary | ICD-10-CM | POA: Diagnosis not present

## 2021-06-03 DIAGNOSIS — M25661 Stiffness of right knee, not elsewhere classified: Secondary | ICD-10-CM | POA: Diagnosis not present

## 2021-06-07 DIAGNOSIS — Z96651 Presence of right artificial knee joint: Secondary | ICD-10-CM | POA: Diagnosis not present

## 2021-06-07 DIAGNOSIS — M25561 Pain in right knee: Secondary | ICD-10-CM | POA: Diagnosis not present

## 2021-06-08 NOTE — Addendum Note (Signed)
Addended by: Britt Bottom on: 06/08/2021 07:34 AM ? ? Modules accepted: Orders ? ?

## 2021-06-15 DIAGNOSIS — M25561 Pain in right knee: Secondary | ICD-10-CM | POA: Diagnosis not present

## 2021-06-15 DIAGNOSIS — Z96651 Presence of right artificial knee joint: Secondary | ICD-10-CM | POA: Diagnosis not present

## 2021-06-22 DIAGNOSIS — M797 Fibromyalgia: Secondary | ICD-10-CM | POA: Diagnosis not present

## 2021-06-22 DIAGNOSIS — E782 Mixed hyperlipidemia: Secondary | ICD-10-CM | POA: Diagnosis not present

## 2021-06-22 DIAGNOSIS — K219 Gastro-esophageal reflux disease without esophagitis: Secondary | ICD-10-CM | POA: Diagnosis not present

## 2021-06-22 DIAGNOSIS — R053 Chronic cough: Secondary | ICD-10-CM | POA: Diagnosis not present

## 2021-06-22 DIAGNOSIS — F411 Generalized anxiety disorder: Secondary | ICD-10-CM | POA: Diagnosis not present

## 2021-06-23 ENCOUNTER — Ambulatory Visit
Admission: RE | Admit: 2021-06-23 | Discharge: 2021-06-23 | Disposition: A | Discharge status: Home / Self Care | Payer: PPO | Source: Ambulatory Visit | Attending: Internal Medicine | Admitting: Internal Medicine

## 2021-06-23 DIAGNOSIS — R928 Other abnormal and inconclusive findings on diagnostic imaging of breast: Secondary | ICD-10-CM | POA: Diagnosis not present

## 2021-07-05 ENCOUNTER — Ambulatory Visit: Payer: PPO | Admitting: Dermatology

## 2021-07-05 DIAGNOSIS — L2089 Other atopic dermatitis: Secondary | ICD-10-CM | POA: Diagnosis not present

## 2021-07-05 NOTE — Patient Instructions (Addendum)
Continue clobetasol/CeraVe mix twice daily as needed for flares. Avoid applying to face, groin, and axilla. Use as directed. Long-term use can cause thinning of the skin. ? ?Recommend daily CeraVe moisturizer, may use anti-itch with pramoxine.  ? ?If You Need Anything After Your Visit ? ?If you have any questions or concerns for your doctor, please call our main line at [phone] and press option 4 to reach your doctor's medical assistant. If no one answers, please leave a voicemail as directed and we will return your call as soon as possible. Messages left after 4 pm will be answered the following business day.  ? ?You may also send [place] a message via MyChart. We typically respond to MyChart messages within 1-2 business days. ? ?For prescription refills, please ask your pharmacy to contact our office. Our fax number is [phone]. ? ?If you have an urgent issue when the clinic is closed that cannot wait until the next business day, you can page your doctor at the number below.   ? ?Please note that while we do our best to be available for urgent issues outside of office hours, we are not available 24/7.  ? ?If you have an urgent issue and are unable to reach [place], you may choose to seek medical care at your doctor's office, retail clinic, urgent care center, or emergency room. ? ?If you have a medical emergency, please immediately call 911 or go to the emergency department. ? ?Pager Numbers ? ?- Dr. Nehemiah Massed: [phone] ? ?- Dr. Laurence Ferrari: [phone] ? ?- Dr. Nicole Kindred: [phone] ? ?In the event of inclement weather, please call our main line at [phone] for an update on the status of any delays or closures. ? ?Dermatology Medication Tips: ?Please keep the boxes that topical medications come in in order to help keep track of the instructions about where and how to use these. Pharmacies typically print the medication instructions only on the boxes and not directly on the medication tubes.  ? ?If your medication  is too expensive, please contact our office at [phone] option 4 or send [place] a message through [place].  ? ?We are unable to tell what your co-pay for medications will be in advance as this is different depending on your insurance coverage. However, we may be able to find a substitute medication at lower cost or fill out paperwork to get insurance to cover a needed medication.  ? ?If a prior authorization is required to get your medication covered by your insurance company, please allow [place] 1-2 business days to complete this process. ? ?Drug prices often vary depending on where the prescription is filled and some pharmacies may offer cheaper prices. ? ?The website www.goodrx.com contains coupons for medications through different pharmacies. The prices here do not account for what the cost may be with help from insurance (it may be cheaper with your insurance), but the website can give you the price if you did not use any insurance.  ?- You can print the associated coupon and take it with your prescription to the pharmacy.  ?- You may also stop by our office during regular business hours and pick up a GoodRx coupon card.  ?- If you need your prescription sent electronically to a different pharmacy, notify our office through [facility] or by phone at [phone] option 4. ? ? ? ? ?Si Usted Necesita Algo Despu?s de Su Visita ? ?Tambi?n puede enviarnos un mensaje a trav?s de MyChart. Por lo general respondemos a los mensajes  de MyChart en el transcurso de 1 a 2 d?as h?biles. ? ?Para renovar recetas, por favor pida a su farmacia que se ponga en contacto con nuestra oficina. Nuestro n?mero de fax es el [phone]. ? ?Si tiene un asunto urgente cuando la cl?nica est? cerrada y que no puede esperar hasta el siguiente d?a h?bil, puede llamar/localizar a su doctor(a) al n?mero que aparece a continuaci?n.  ? ?Por favor, tenga en cuenta que aunque hacemos todo lo posible para estar disponibles para asuntos  urgentes fuera del horario de oficina, no estamos disponibles las 24 horas del d?a, los 7 d?as de la semana.  ? ?Si tiene un problema urgente y no puede comunicarse con nosotros, puede optar por buscar atenci?n m?dica  en el consultorio de su doctor(a), en una cl?nica privada, en un centro de atenci?n urgente o en una sala de emergencias. ? ?Si tiene Engineer, maintenance (IT) m?dica, por favor llame inmediatamente al 911 o vaya a la sala de emergencias. ? ?N?meros de b?per ? ?- Dr. Nehemiah Massed: [phone] ? ?- Dra. Moye: [phone] ? ?- Dra. Nicole Kindred: [phone] ? ?En caso de inclemencias del tiempo, por favor llame a nuestra l?nea principal al [phone] para una actualizaci?n sobre el estado de cualquier retraso o cierre. ? ?Consejos para la medicaci?n en dermatolog?a: ?Por favor, guarde las cajas en las que vienen los medicamentos de uso t?pico para ayudarle a seguir las instrucciones sobre d?nde y c?mo usarlos. Las farmacias generalmente imprimen las instrucciones del medicamento s?lo en las cajas y no directamente en los tubos del [place].  ? ?Si su medicamento es muy caro, por favor, p?ngase en contacto con Zigmund Daniel llamando al [phone] y presione la opci?n 4 o env?enos un mensaje a trav?s de MyChart.  ? ?No podemos decirle cu?l ser? su copago por los medicamentos por adelantado ya que esto es diferente dependiendo de la cobertura de su seguro. Sin embargo, es posible que podamos encontrar un medicamento sustituto a Electrical engineer un formulario para que el seguro cubra el medicamento que se considera necesario.  ? ?Si se requiere Ardelia Mems autorizaci?n previa para que su compa??a de seguros [place] su medicamento, por favor perm?tanos de 1 a 2 d?as h?biles para completar este proceso. ? ?Los precios de los medicamentos var?an con frecuencia dependiendo del Environmental consultant de d?nde se surte la receta y alguna farmacias pueden ofrecer precios m?s baratos. ? ?El sitio web www.goodrx.com tiene cupones para  medicamentos de Airline pilot. Los precios aqu? no tienen en cuenta lo que podr?a costar con la ayuda del seguro (puede ser m?s barato con su seguro), pero el sitio web puede darle el precio si no utiliz? ning?n seguro.  ?- Puede imprimir el cup?n correspondiente y llevarlo con su receta a la farmacia.  ?- Tambi?n puede pasar por nuestra oficina durante el horario de atenci?n regular y recoger una tarjeta de cupones de GoodRx.  ?- Si necesita que su receta se env?e electr?nicamente a Chiropodist, informe a nuestra oficina a trav?s de MyChart de [place] o por tel?fono llamando al [phone] y presione la opci?n 4. ? ?

## 2021-07-05 NOTE — Progress Notes (Signed)
? ?  Follow-Up Visit ?  ?Subjective  ?Andrea Savage is a 76 y.o. female who presents for the following: Follow-up (Patient here today for 1 month AD follow up. Patient currently using clobetasol/CeraVe mix and advises she has cleared up and itching has improved. ). ? ?Patient does not want to pursue Dupixent at this time. ? ?The following portions of the chart were reviewed this encounter and updated as appropriate:  ?  ?  ? ?Review of Systems:  No other skin or systemic complaints except as noted in HPI or Assessment and Plan. ? ?Objective  ?Well appearing patient in no apparent distress; mood and affect are within normal limits. ? ?A focused examination was performed including back, abdomen. Relevant physical exam findings are noted in the Assessment and Plan. ? ?back, abdomen ?Mild erythema at mid back, otherwise clear ? ? ? ?Assessment & Plan  ?Other atopic dermatitis ?back, abdomen ? ?Chronic condition with duration or expected duration over one year. Currently well-controlled on topical treatment. ? ?Atopic dermatitis (eczema) is a chronic, relapsing, pruritic condition that can significantly affect quality of life. It is often associated with allergic rhinitis and/or asthma and can require treatment with topical medications, phototherapy, or in severe cases biologic injectable medication (Dupixent; Adbry) or Oral JAK inhibitors.  ? ?Continue clobetasol/CeraVe mix twice daily as needed for flares/itch. Avoid applying to face, groin, and axilla. Use as directed. Long-term use can cause thinning of the skin. ?Will hold off on starting Dupixent at this time since improved ? ?Recommend mild soap and moisturizing cream 1-2 times daily.  Gentle skin care handout provided.  Recommend daily CeraVe anti itch moisturizer.  ? ? ?Return in about 10 months (around 05/06/2022) for TBSE. ? ?Graciella Belton, RMA, am acting as scribe for Brendolyn Patty, MD . ? ?Documentation: I have reviewed the above documentation for  accuracy and completeness, and I agree with the above. ? ?Brendolyn Patty MD  ? ?

## 2021-08-11 ENCOUNTER — Other Ambulatory Visit: Payer: Self-pay | Admitting: Family

## 2021-08-11 DIAGNOSIS — E782 Mixed hyperlipidemia: Secondary | ICD-10-CM

## 2021-08-11 NOTE — Telephone Encounter (Signed)
Pt of Dr. End. Please review for refill. Thank you! 

## 2021-08-16 ENCOUNTER — Telehealth: Payer: Self-pay | Admitting: Internal Medicine

## 2021-08-16 NOTE — Telephone Encounter (Signed)
STAT if patient feels like he/she is going to faint   Are you dizzy now? No  Do you feel faint or have you passed out? No  Do you have any other symptoms? This morning felt nausea, anxiety, having trouble focusing. Felt disoriented.   Have you checked your HR and BP (record if available)? No   Pt states she has a church trip she was suppose to go on soon, but does not know if she should go now. She states this cleared up when she took metoprolol succinate and baby aspirin. Req call back.

## 2021-08-16 NOTE — Telephone Encounter (Signed)
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 Today she was driving and she did not feel well. She came home ate something, took metoprolol, aspirin, had some fluids, and felt better within 30 minutes. She reports blood sugar was previously low and she felt the same way. Heart rate went up to 91 and was disoriented and weird. She felt it could be due to her blood sugar or not eating. She reports normal heart rates are 72-78. Patient then stated she went to ED previously for similar symptoms and it was her blood sugars. Encouraged her to hydrate and make sure she eats. Inquired how she was taking her metoprolol and reports twice a day. I see it documented in her chart to take once daily. Provided reassurance that it sounded like low blood sugars but if it should persist then to call her PCP for further assessment. Reviewed what dose and the label on the bottle. She reports 25 mg pills and she was taking twice a day but the bottle does state once daily. Instructed her to only take it once daily and that I would update provider and if she has further recommendations then we will call back but if no call just continue that medication once a day. She verbalized understanding with no further questions at this time.

## 2021-09-21 DIAGNOSIS — M797 Fibromyalgia: Secondary | ICD-10-CM | POA: Diagnosis not present

## 2021-09-21 DIAGNOSIS — E782 Mixed hyperlipidemia: Secondary | ICD-10-CM | POA: Diagnosis not present

## 2021-09-21 DIAGNOSIS — K219 Gastro-esophageal reflux disease without esophagitis: Secondary | ICD-10-CM | POA: Diagnosis not present

## 2021-09-21 DIAGNOSIS — F411 Generalized anxiety disorder: Secondary | ICD-10-CM | POA: Diagnosis not present

## 2021-09-21 DIAGNOSIS — Z780 Asymptomatic menopausal state: Secondary | ICD-10-CM | POA: Diagnosis not present

## 2021-09-21 DIAGNOSIS — R202 Paresthesia of skin: Secondary | ICD-10-CM | POA: Diagnosis not present

## 2021-10-27 DIAGNOSIS — I739 Peripheral vascular disease, unspecified: Secondary | ICD-10-CM | POA: Diagnosis not present

## 2021-10-27 DIAGNOSIS — Z96651 Presence of right artificial knee joint: Secondary | ICD-10-CM | POA: Diagnosis not present

## 2021-10-29 DIAGNOSIS — H16223 Keratoconjunctivitis sicca, not specified as Sjogren's, bilateral: Secondary | ICD-10-CM | POA: Diagnosis not present

## 2021-11-24 DIAGNOSIS — E538 Deficiency of other specified B group vitamins: Secondary | ICD-10-CM | POA: Diagnosis not present

## 2021-11-24 DIAGNOSIS — K219 Gastro-esophageal reflux disease without esophagitis: Secondary | ICD-10-CM | POA: Diagnosis not present

## 2021-11-24 DIAGNOSIS — E782 Mixed hyperlipidemia: Secondary | ICD-10-CM | POA: Diagnosis not present

## 2021-11-24 DIAGNOSIS — Z23 Encounter for immunization: Secondary | ICD-10-CM | POA: Diagnosis not present

## 2021-11-24 DIAGNOSIS — M797 Fibromyalgia: Secondary | ICD-10-CM | POA: Diagnosis not present

## 2021-12-02 DIAGNOSIS — H524 Presbyopia: Secondary | ICD-10-CM | POA: Diagnosis not present

## 2021-12-02 DIAGNOSIS — H16223 Keratoconjunctivitis sicca, not specified as Sjogren's, bilateral: Secondary | ICD-10-CM | POA: Diagnosis not present

## 2021-12-02 DIAGNOSIS — H16221 Keratoconjunctivitis sicca, not specified as Sjogren's, right eye: Secondary | ICD-10-CM | POA: Diagnosis not present

## 2021-12-07 DIAGNOSIS — Z96651 Presence of right artificial knee joint: Secondary | ICD-10-CM | POA: Diagnosis not present

## 2021-12-15 ENCOUNTER — Ambulatory Visit: Payer: PPO | Admitting: Dermatology

## 2021-12-15 DIAGNOSIS — L578 Other skin changes due to chronic exposure to nonionizing radiation: Secondary | ICD-10-CM

## 2021-12-15 DIAGNOSIS — C4492 Squamous cell carcinoma of skin, unspecified: Secondary | ICD-10-CM

## 2021-12-15 DIAGNOSIS — D485 Neoplasm of uncertain behavior of skin: Secondary | ICD-10-CM

## 2021-12-15 DIAGNOSIS — L853 Xerosis cutis: Secondary | ICD-10-CM | POA: Diagnosis not present

## 2021-12-15 DIAGNOSIS — C44529 Squamous cell carcinoma of skin of other part of trunk: Secondary | ICD-10-CM

## 2021-12-15 HISTORY — DX: Squamous cell carcinoma of skin, unspecified: C44.92

## 2021-12-15 NOTE — Progress Notes (Signed)
   Follow-Up Visit   Subjective  Andrea Savage is a 76 y.o. female who presents for the following: Growth (Back x 5 months. Area was sore and itchy, some improvement recently. ). Sore when she sits back in chair. She tried clobetasol/CeraVe mix, but didn't clear. No prior h/o skin cancer.   The following portions of the chart were reviewed this encounter and updated as appropriate:       Review of Systems:  No other skin or systemic complaints except as noted in HPI or Assessment and Plan.  Objective  Well appearing patient in no apparent distress; mood and affect are within normal limits.  A focused examination was performed including back. Relevant physical exam findings are noted in the Assessment and Plan.  R spinal mid lower back 1.1 cm pink papule with central crust         Assessment & Plan  Neoplasm of uncertain behavior of skin R spinal mid lower back  Skin / nail biopsy Type of biopsy: tangential   Informed consent: discussed and consent obtained   Patient was prepped and draped in usual sterile fashion: Area prepped with alcohol. Anesthesia: the lesion was anesthetized in a standard fashion   Anesthetic:  1% lidocaine w/ epinephrine 1-100,000 buffered w/ 8.4% NaHCO3 Instrument used: flexible razor blade   Hemostasis achieved with: pressure, aluminum chloride and electrodesiccation   Outcome: patient tolerated procedure well    Destruction of lesion  Destruction method: electrodesiccation and curettage   Informed consent: discussed and consent obtained   Curettage performed in three different directions: Yes   Electrodesiccation performed over the curetted area: Yes   Final wound size (cm):  1.4 Hemostasis achieved with:  pressure, aluminum chloride and electrodesiccation Outcome: patient tolerated procedure well with no complications   Post-procedure details: wound care instructions given   Post-procedure details comment:  Ointment and bandage  applied.  Specimen 1 - Surgical pathology Differential Diagnosis: Inflamed SK r/o BCC/SCC Check Margins: No EDC today  Isk r/o BCC/SCC  Actinic Damage - chronic, secondary to cumulative UV radiation exposure/sun exposure over time - diffuse scaly erythematous macules with underlying dyspigmentation - Recommend daily broad spectrum sunscreen SPF 30+ to sun-exposed areas, reapply every 2 hours as needed.  - Recommend staying in the shade or wearing long sleeves, sun glasses (UVA+UVB protection) and wide brim hats (4-inch brim around the entire circumference of the hat). - Call for new or changing lesions.  Xerosis - diffuse xerotic patches - recommend gentle, hydrating skin care - gentle skin care handout given Recommend starting moisturizer with exfoliant (Urea, Salicylic acid, or Lactic acid) one to two times daily to help smooth rough and bumpy skin.  OTC options include Cetaphil Rough and Bumpy lotion (Urea), Eucerin Roughness Relief lotion or spot treatment cream (Urea), CeraVe SA lotion/cream for Rough and Bumpy skin (Sal Acid), Gold Bond Rough and Bumpy cream (Sal Acid), and AmLactin 12% lotion/cream (Lactic Acid).  If applying in morning, also apply sunscreen to sun-exposed areas, since these exfoliating moisturizers can increase sensitivity to sun.   Return as scheduled, for TBSE.  IJamesetta Orleans, CMA, am acting as scribe for Brendolyn Patty, MD .  Documentation: I have reviewed the above documentation for accuracy and completeness, and I agree with the above.  Brendolyn Patty MD

## 2021-12-15 NOTE — Patient Instructions (Addendum)
Wound Care Instructions  Cleanse wound gently with soap and water once a day then pat dry with clean gauze. Apply a thin coat of Petrolatum (petroleum jelly, "Vaseline") over the wound (unless you have an allergy to this). We recommend that you use a new, sterile tube of Vaseline. Do not pick or remove scabs. Do not remove the yellow or white "healing tissue" from the base of the wound.  Cover the wound with fresh, clean, nonstick gauze and secure with paper tape. You may use Band-Aids in place of gauze and tape if the wound is small enough, but would recommend trimming much of the tape off as there is often too much. Sometimes Band-Aids can irritate the skin.  You should call the office for your biopsy report after 1 week if you have not already been contacted.  If you experience any problems, such as abnormal amounts of bleeding, swelling, significant bruising, significant pain, or evidence of infection, please call the office immediately.  FOR ADULT SURGERY PATIENTS: If you need something for pain relief you may take 1 extra strength Tylenol (acetaminophen) AND 2 Ibuprofen ('200mg'$  each) together every 4 hours as needed for pain. (do not take these if you are allergic to them or if you have a reason you should not take them.) Typically, you may only need pain medication for 1 to 3 days.   Electrodesiccation and Curettage ("Scrape and Burn") Wound Care Instructions  Leave the original bandage on for 24 hours if possible.  If the bandage becomes soaked or soiled before that time, it is OK to remove it and examine the wound.  A small amount of post-operative bleeding is normal.  If excessive bleeding occurs, remove the bandage, place gauze over the site and apply continuous pressure (no peeking) over the area for 30 minutes. If this does not work, please call our clinic as soon as possible or page your doctor if it is after hours.   Once a day, cleanse the wound with soap and water. It is fine to  shower. If a thick crust develops you may use a Q-tip dipped into dilute hydrogen peroxide (mix 1:1 with water) to dissolve it.  Hydrogen peroxide can slow the healing process, so use it only as needed.    After washing, apply petroleum jelly (Vaseline) or an antibiotic ointment if your doctor prescribed one for you, followed by a bandage.    For best healing, the wound should be covered with a layer of ointment at all times. If you are not able to keep the area covered with a bandage to hold the ointment in place, this may mean re-applying the ointment several times a day.  Continue this wound care until the wound has healed and is no longer open. It may take several weeks for the wound to heal and close.  Itching and mild discomfort is normal during the healing process.  If you have any discomfort, you can take Tylenol (acetaminophen) or ibuprofen as directed on the bottle. (Please do not take these if you have an allergy to them or cannot take them for another reason).  Some redness, tenderness and white or yellow material in the wound is normal healing.  If the area becomes very sore and red, or develops a thick yellow-green material (pus), it may be infected; please notify us.    Wound healing continues for up to one year following surgery. It is not unusual to experience pain in the scar from time to time during  the interval.  If the pain becomes severe or the scar thickens, you should notify the office.    A slight amount of redness in a scar is expected for the first six months.  After six months, the redness will fade and the scar will soften and fade.  The color difference becomes less noticeable with time.  If there are any problems, return for a post-op surgery check at your earliest convenience.  To improve the appearance of the scar, you can use silicone scar gel, cream, or sheets (such as Mederma or Serica) every night for up to one year. These are available over the counter (without a  prescription).  Please call our office at [phone] for any questions or concerns.  Recommend starting moisturizer with exfoliant (Urea, Salicylic acid, or Lactic acid) one to two times daily to help smooth rough and bumpy skin.  OTC options include Cetaphil Rough and Bumpy lotion (Urea), Eucerin Roughness Relief lotion or spot treatment cream (Urea), CeraVe SA lotion/cream for Rough and Bumpy skin (Sal Acid), Gold Bond Rough and Bumpy cream (Sal Acid), and AmLactin 12% lotion/cream (Lactic Acid).  If applying in morning, also apply sunscreen to sun-exposed areas, since these exfoliating moisturizers can increase sensitivity to sun.   Dry Skin Care  What causes dry skin?  Dry skin is common and results from inadequate moisture in the outer skin layers. Dry skin usually results from the excessive loss of moisture from the skin surface. This occurs due to two major factors: Normally the skin's oil glands deposit a layer of oil on the skin's surface. This layer of oil prevents the loss of moisture from the skin. Exposure to soaps, cleaners, solvents, and disinfectants removes this oily film, allowing water to escape. Water loss from the skin increases when the humidity is low. During winter months we spend a lot of time indoors where the air is heated. Heated air has very low humidity. This also contributes to dry skin.  A tendency for dry skin may accompany such disorders as eczema. Also, as people age, the number of functioning oil glands decreases, and the tendency toward dry skin can be a sensation of skin tightness when emerging from the shower.  How do I manage dry skin?  Humidify your environment. This can be accomplished by using a humidifier in your bedroom at night during winter months. Bathing can actually put moisture back into your skin if done right. Take the following steps while bathing to sooth dry skin: Avoid hot water, which only dries the skin and makes itching worse. Use  warm water. Avoid washcloths or extensive rubbing or scrubbing. Use mild soaps like unscented Dove, Oil of Olay, Cetaphil, Basis, or CeraVe. If you take baths rather than showers, rinse off soap residue with clean water before getting out of tub. Once out of the shower/tub, pat dry gently with a soft towel. Leave your skin damp. While still damp, apply any medicated ointment/cream you were prescribed to the affected areas. After you apply your medicated ointment/cream, then apply your moisturizer to your whole body.This is the most important step in dry skin care. If this is omitted, your skin will continue to be dry. The choice of moisturizer is also very important. In general, lotion will not provider enough moisture to severely dry skin because it is water based. You should use an ointment or cream. Moisturizers should also be unscented. Good choices include Vaseline (plain petrolatum), Aquaphor, Cetaphil, CeraVe, Vanicream, DML Forte, Aveeno moisture, or Eucerin  Cream. Bath oils can be helpful, but do not replace the application of moisturizer after the bath. In addition, they make the tub slippery causing an increased risk for falls. Therefore, we do not recommend their use. Due to recent changes in healthcare laws, you may see results of your pathology and/or laboratory studies on MyChart before the doctors have had a chance to review them. We understand that in some cases there may be results that are confusing or concerning to you. Please understand that not all results are received at the same time and often the doctors may need to interpret multiple results in order to provide you with the best plan of care or course of treatment. Therefore, we ask that you please give [place] 2 business days to thoroughly review all your results before contacting the office for clarification. Should we see a critical lab result, you will be contacted sooner.   If You Need Anything After Your Visit  If you have any  questions or concerns for your doctor, please call our main line at [phone] and press option 4 to reach your doctor's medical assistant. If no one answers, please leave a voicemail as directed and we will return your call as soon as possible. Messages left after 4 pm will be answered the following business day.   You may also send [place] a message via [place]. We typically respond to MyChart messages within 1-2 business days.  For prescription refills, please ask your pharmacy to contact our office. Our fax number is [phone].  If you have an urgent issue when the clinic is closed that cannot wait until the next business day, you can page your doctor at the number below.    Please note that while we do our best to be available for urgent issues outside of office hours, we are not available 24/7.   If you have an urgent issue and are unable to reach [place], you may choose to seek medical care at your doctor's office, retail clinic, urgent care center, or emergency room.  If you have a medical emergency, please immediately call 911 or go to the emergency department.  Pager Numbers  - Dr. Nehemiah Massed: [phone]  - Dr. Laurence Ferrari: [phone]  - Dr. Nicole Kindred: [phone]  In the event of inclement weather, please call our main line at [phone] for an update on the status of any delays or closures.  Dermatology Medication Tips: Please keep the boxes that topical medications come in in order to help keep track of the instructions about where and how to use these. Pharmacies typically print the medication instructions only on the boxes and not directly on the medication tubes.   If your medication is too expensive, please contact our office at [phone] option 4 or send [place] a message through [place].   We are unable to tell what your co-pay for medications will be in advance as this is different depending on your insurance coverage. However, we may be able to find a substitute medication at  lower cost or fill out paperwork to get insurance to cover a needed medication.   If a prior authorization is required to get your medication covered by your insurance company, please allow [place] 1-2 business days to complete this process.  Drug prices often vary depending on where the prescription is filled and some pharmacies may offer cheaper prices.  The website www.goodrx.com contains coupons for medications through different pharmacies. The prices here do not account for what the cost may be  with help from insurance (it may be cheaper with your insurance), but the website can give you the price if you did not use any insurance.  - You can print the associated coupon and take it with your prescription to the pharmacy.  - You may also stop by our office during regular business hours and pick up a GoodRx coupon card.  - If you need your prescription sent electronically to a different pharmacy, notify our office through [facility] or by phone at [phone] option 4.     Si Usted Necesita Algo Despus de Su Visita  Tambin puede enviarnos un mensaje a travs de Pharmacist, community. Por lo general respondemos a los mensajes de MyChart en el transcurso de 1 a 2 das hbiles.  Para renovar recetas, por favor pida a su farmacia que se ponga en contacto con nuestra oficina. Harland Dingwall de fax es [place] [phone].  Si tiene un asunto urgente cuando la clnica est cerrada y que no puede esperar hasta el siguiente da hbil, puede llamar/localizar a su doctor(a) al nmero que aparece a continuacin.   Por favor, tenga en cuenta que aunque hacemos todo lo posible para estar disponibles para asuntos urgentes fuera del horario de [place], no estamos disponibles las 24 horas del da, los 7 das de la [place].   Si tiene un problema urgente y no puede comunicarse con nosotros, puede optar por buscar atencin mdica  en el consultorio de su doctor(a), en una clnica privada, en un centro de atencin urgente  o en una sala de emergencias.  Si tiene Engineering geologist, por favor llame inmediatamente al 911 o vaya a la sala de emergencias.  Nmeros de bper  - Dr. Nehemiah Massed: [phone]  - Dra. Moye: [phone]  - Dra. Nicole Kindred: [phone]  En caso de inclemencias del [place], por favor llame a Johnsie Kindred principal al [phone] para una actualizacin sobre el [place] de cualquier retraso o cierre.  Consejos para la medicacin en dermatologa: Por favor, guarde las cajas en las que vienen los medicamentos de uso tpico para ayudarle a seguir las instrucciones sobre dnde y cmo usarlos. Las farmacias generalmente imprimen las instrucciones del medicamento slo en las cajas y no directamente en los tubos del [place].   Si su medicamento es muy caro, por favor, pngase en contacto con Zigmund Daniel llamando al [phone] y presione la opcin 4 o envenos un mensaje a travs de Pharmacist, community.   No podemos decirle cul ser su copago por los medicamentos por adelantado ya que esto es diferente dependiendo de la cobertura de su seguro. Sin embargo, es posible que podamos encontrar un medicamento sustituto a Electrical engineer un formulario para que el seguro cubra el medicamento que se considera necesario.   Si se requiere una autorizacin previa para que su compaa de seguros [place] su medicamento, por favor permtanos de 1 a 2 das hbiles para completar este proceso.  Los precios de los medicamentos varan con frecuencia dependiendo del Environmental consultant de dnde se surte la receta y alguna farmacias pueden ofrecer precios ms baratos.  El sitio web www.goodrx.com tiene cupones para medicamentos de Airline pilot. Los precios aqu no tienen en cuenta lo que podra costar con la ayuda del seguro (puede ser ms barato con su seguro), pero el sitio web puede darle el precio si no utiliz Research scientist (physical sciences).  - Puede imprimir el cupn correspondiente y llevarlo con su receta a la farmacia.  - Tambin  puede pasar por Cleotis Nipper oficina durante  el horario de atencin regular y Charity fundraiser una tarjeta de cupones de GoodRx.  - Si necesita que su receta se enve electrnicamente a una farmacia diferente, informe a nuestra oficina a travs de MyChart de [place] o por telfono llamando al [phone] y presione la opcin 4.

## 2021-12-17 ENCOUNTER — Encounter: Payer: Self-pay | Admitting: Internal Medicine

## 2021-12-17 ENCOUNTER — Ambulatory Visit: Payer: PPO | Attending: Internal Medicine | Admitting: Internal Medicine

## 2021-12-17 VITALS — BP 110/78 | HR 74 | Ht 64.5 in | Wt 164.0 lb

## 2021-12-17 DIAGNOSIS — I340 Nonrheumatic mitral (valve) insufficiency: Secondary | ICD-10-CM

## 2021-12-17 DIAGNOSIS — E785 Hyperlipidemia, unspecified: Secondary | ICD-10-CM | POA: Diagnosis not present

## 2021-12-17 DIAGNOSIS — I10 Essential (primary) hypertension: Secondary | ICD-10-CM | POA: Diagnosis not present

## 2021-12-17 DIAGNOSIS — I5032 Chronic diastolic (congestive) heart failure: Secondary | ICD-10-CM | POA: Diagnosis not present

## 2021-12-17 DIAGNOSIS — I471 Supraventricular tachycardia, unspecified: Secondary | ICD-10-CM

## 2021-12-17 DIAGNOSIS — R053 Chronic cough: Secondary | ICD-10-CM | POA: Diagnosis not present

## 2021-12-17 NOTE — Progress Notes (Unsigned)
Follow-up Outpatient Visit Date: 12/17/2021  Primary Care Provider: Enid Baas, MD [address] [place] [address]  Chief Complaint: Follow-up PSVT and HFpEF  HPI:  Andrea Savage is a 76 y.o. female with history of HLD, HFpEF, palpitations/PSVT, recurrent syncope/near syncope, chronic cough, possible asthma, fibromyalgia, depression, postherpetic neuralgia, who presents for follow-up of PSVT and HFpEF.  She was last seen by Terrilee Croak, PA, in March.  Today, Andrea Savage reports that she has been feeling well without breakthrough palpitations nor chest pain or shortness of breath.  She still has some swelling in her right leg that she attributes to residual effects from her knee replacement a year ago.  Her main concern is of persistent cough with intermittent white sputum production.  This has been going on for years.  She is currently on hydrocodone and feels like this has been helping a bit.  Taking a small sip of rum seems to help the most.  She wonders if her hiatal hernia could also be contributing.  --------------------------------------------------------------------------------------------------  Past Medical History:  Diagnosis Date   (HFpEF) heart failure with preserved ejection fraction (HCC)    Anxiety    Asthma    Depression    Fibromyalgia    GERD (gastroesophageal reflux disease)    Grade II diastolic dysfunction    H/O scarlet fever    as child   Heart murmur    s/p scarlet fever as child   HLD (hyperlipidemia)    HTN (hypertension)    IBS (irritable bowel syndrome)    Low bone density    Mitral regurgitation    OA (osteoarthritis)    Osteopenia    Palpitations    PHN (postherpetic neuralgia)    PSVT (paroxysmal supraventricular tachycardia)    Recurrent syncope    Rosacea    Seizure (HCC) 1978   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   Wears hearing aid in both ears    Past Surgical History:  Procedure Laterality Date   ABDOMINAL  HYSTERECTOMY  2009   Total   BREAST EXCISIONAL BIOPSY Left 1979   NEG   COLONOSCOPY  [id number]   repeat 10 years   COLONOSCOPY WITH PROPOFOL N/A 03/24/2020   Procedure: COLONOSCOPY WITH PROPOFOL;  Surgeon: Regis Bill, MD;  Location: [facility];  Service: Endoscopy;  Laterality: N/A;   ESOPHAGOGASTRODUODENOSCOPY (EGD) WITH PROPOFOL N/A 03/24/2020   Procedure: ESOPHAGOGASTRODUODENOSCOPY (EGD) WITH PROPOFOL;  Surgeon: Regis Bill, MD;  Location: [facility];  Service: Endoscopy;  Laterality: N/A;   EXCISION MORTON'S NEUROMA Left 09/21/2017   Procedure: EXCISION MORTON'S NEUROMA;  Surgeon: Recardo Evangelist, DPM;  Location: [facility];  Service: Podiatry;  Laterality: Left;   FOOT SURGERY Right 1990   HAND SURGERY Right    KNEE ARTHROPLASTY Right 12/14/2020   Procedure: COMPUTER ASSISTED TOTAL KNEE ARTHROPLASTY;  Surgeon: Donato Heinz, MD;  Location: [facility];  Service: Orthopedics;  Laterality: Right;   KNEE ARTHROSCOPY Right 10/14/2020   Procedure: ARTHROSCOPY KNEE, LATERAL MENISECTOMY;  Surgeon: Donato Heinz, MD;  Location: [facility];  Service: Orthopedics;  Laterality: Right;   LAPAROSCOPIC OOPHERECTOMY     MINOR HARDWARE REMOVAL Left 11/22/2018   Procedure: REMOVAL SCREW;DEEP 2nd and 3RD LEFT CAPSULE RELEASE OF THIRD METATARSAL PHALANGEAL JOINT LEFT, TENDON LENGTHING THIRD TOE LEFT AND CORTISONE INJECTION LEFT;  Surgeon: Recardo Evangelist, DPM;  Location: [facility];  Service: Podiatry;  Laterality: Left;  LMA OR IVA LOCAL   TONSILLECTOMY     TONSILLECTOMY  WEIL OSTEOTOMY Left 09/21/2017   Procedure: WEIL OSTEOTOMY-2ND & 3RD TOES;  Surgeon: Albertine Patricia, DPM;  Location: [place];  Service: Podiatry;  Laterality: Left;  LMA WITH LOCAL MINI MONSTER DR TROXLER WANTS TED HOSE ON PATIENT'S RIGHT LEG    Current Meds  Medication Sig   albuterol (VENTOLIN HFA) 108 (90 Base) MCG/ACT inhaler Inhale 2 puffs into the lungs every 6 (six) hours  as needed for wheezing or shortness of breath.   busPIRone (BUSPAR) 5 MG tablet Take 1 tablet (5 mg total) by mouth daily.   carboxymethylcellulose (REFRESH PLUS) 0.5 % SOLN Place 1 drop into both eyes as needed (dry eyes).   castor oil liquid Take by mouth daily as needed.   citalopram (CELEXA) 20 MG tablet Take 20 mg by mouth daily.   cyanocobalamin (VITAMIN B12) 1000 MCG tablet Take 1,000 mcg by mouth daily.   Cyanocobalamin 1000 MCG/ML KIT Inject as directed every 30 (thirty) days.   EPINEPHrine 0.3 mg/0.3 mL IJ SOAJ injection Inject 0.3 mg into the muscle as needed for anaphylaxis.   ezetimibe (ZETIA) 10 MG tablet Take 1 tablet (10 mg total) by mouth daily.   fluticasone (FLOVENT HFA) 220 MCG/ACT inhaler Inhale into the lungs as needed.   furosemide (LASIX) 20 MG tablet Take 20 mg by mouth daily as needed.   HYDROcodone-acetaminophen (NORCO/VICODIN) 5-325 MG tablet Take 0.5 tablets by mouth 2 (two) times daily as needed.   loratadine (CLARITIN) 10 MG tablet Take 10 mg by mouth daily.   metoprolol succinate (TOPROL-XL) 25 MG 24 hr tablet Take 1 tablet (25 mg total) by mouth daily.   naproxen sodium (ALEVE) 220 MG tablet Take 220 mg by mouth as needed.   omeprazole (PRILOSEC) 40 MG capsule Take 40 mg by mouth in the morning and at bedtime.    Allergies: Compazine [prochlorperazine], Dust mite extract, Oxycodone, and Gabapentin  Social History   Tobacco Use   Smoking status: Never   Smokeless tobacco: Never  Vaping Use   Vaping Use: Never used  Substance Use Topics   Alcohol use: Yes    Alcohol/week: 1.0 standard drink of alcohol    Types: 1 Glasses of wine per week    Comment: on occasion   Drug use: No    Family History  Problem Relation Age of Onset   Melanoma Mother    Cancer Mother        breast   Breast cancer Mother 21   Cancer Father        pancreatic   Asthma Father    Heart disease Father    Psoriasis Sister    Liver disease Sister    Diabetes Sister     Thyroid disease Sister    Melanoma Brother    Thyroid disease Brother    Kidney disease Brother     Review of Systems: A 12-system review of systems was performed and was negative except as noted in the HPI.  --------------------------------------------------------------------------------------------------  Physical Exam: BP 110/78 (BP Location: Left Arm, Patient Position: Sitting, Cuff Size: Large)   Pulse 74   Ht 5' 4.5" (1.638 m)   Wt 164 lb (74.4 kg)   SpO2 96%   BMI 27.72 kg/m   General:  NAD. Neck: No JVD or HJR. Lungs: Clear to auscultation bilaterally without wheezes or crackles. Heart: Regular rate and rhythm without murmurs, rubs, or gallops. Abdomen: Soft, nontender, nondistended. Extremities: Trace pretibial edema.  EKG: Normal sinus rhythm without abnormality.  No significant change  since 05/28/2021.  Lab Results  Component Value Date   WBC 4.8 12/07/2020   HGB 13.8 12/07/2020   HCT 40.6 12/07/2020   MCV 93.1 12/07/2020   PLT 195 12/07/2020    Lab Results  Component Value Date   NA 137 12/07/2020   K 4.0 12/07/2020   CL 102 12/07/2020   CO2 29 12/07/2020   BUN 17 12/07/2020   CREATININE 0.86 12/07/2020   GLUCOSE 77 12/07/2020   ALT 17 09/30/2019    Lab Results  Component Value Date   CHOL 178 05/24/2021   HDL 59 05/24/2021   LDLCALC 102 (H) 05/24/2021   LDLDIRECT 149 (H) 06/20/2019   TRIG 95 05/24/2021   CHOLHDL 3.0 05/24/2021    --------------------------------------------------------------------------------------------------  ASSESSMENT AND PLAN: PSVT: Palpitations well-controlled.  Continue metoprolol succinate 25 mg daily.  Chronic HFpEF and and mild-moderate mitral regurgitation: Trace pretibial edema noted with NYHA class I symptoms.  Continue furosemide 20 mg daily as needed for edema.  Plan to repeat an echocardiogram in 3 years, sooner if symptoms progress.  Hypertension: Blood pressure well-controlled.  No medication changes  at this time.  Hyperlipidemia: Currently on ezetimibe; ongoing follow-up and management per [name].  Chronic cough: Present for at least 15 years with extensive work-up in the past per Ms. Belnap report.  Continue ongoing management per [name].  Follow-up: Return to clinic in 1 year.  Nelva Bush, MD 12/17/2021 4:16 PM

## 2021-12-17 NOTE — Patient Instructions (Signed)
Medication Instructions:  Your physician recommends that you continue on your current medications as directed. Please refer to the Current Medication list given to you today.   *If you need a refill on your cardiac medications before your next appointment, please call your pharmacy*   Lab Work: None ordered    If you have labs (blood work) drawn today and your tests are completely normal, you will receive your results only by: [place] (if you have MyChart) OR A paper copy in the mail If you have any lab test that is abnormal or we need to change your treatment, we will call you to review the results.   Testing/Procedures: None ordered  Follow-Up: At [facility], you and your health needs are our priority.  As part of our continuing mission to provide you with exceptional heart care, we have created designated Provider Care Teams.  These Care Teams include your primary Cardiologist (physician) and Advanced Practice Providers (APPs -  Physician Assistants and Nurse Practitioners) who all work together to provide you with the care you need, when you need it.  We recommend signing up for the patient portal called "MyChart".  Sign up information is provided on this After Visit Summary.  MyChart is used to connect with patients for Virtual Visits (Telemedicine).  Patients are able to view lab/test results, encounter notes, upcoming appointments, etc.  Non-urgent messages can be sent to your provider as well.   To learn more about what you can do with MyChart, go to [email or web address].    Your next appointment:   1 year(s)  The format for your next appointment:   In Person  Provider:   You may see Nelva Bush, MD or one of the following Advanced Practice Providers on your designated Care Team:   Murray Hodgkins, NP Christell Faith, PA-C Cadence Kathlen Mody, PA-C Gerrie Nordmann, NP   Important Information About Sugar

## 2021-12-18 ENCOUNTER — Encounter: Payer: Self-pay | Admitting: Internal Medicine

## 2021-12-18 DIAGNOSIS — I340 Nonrheumatic mitral (valve) insufficiency: Secondary | ICD-10-CM | POA: Insufficient documentation

## 2021-12-18 DIAGNOSIS — I10 Essential (primary) hypertension: Secondary | ICD-10-CM | POA: Insufficient documentation

## 2021-12-20 ENCOUNTER — Other Ambulatory Visit: Payer: Self-pay | Admitting: Family

## 2021-12-20 ENCOUNTER — Telehealth: Payer: Self-pay

## 2021-12-20 DIAGNOSIS — E782 Mixed hyperlipidemia: Secondary | ICD-10-CM

## 2021-12-20 NOTE — Telephone Encounter (Signed)
Advised patient biopsy of the right spinal mid lower back was SCC and has been treated with EDC. Will recheck at next appt.

## 2021-12-20 NOTE — Telephone Encounter (Signed)
Rx(s) sent to pharmacy electronically.  

## 2021-12-20 NOTE — Telephone Encounter (Signed)
-----   Message from Brendolyn Patty, MD sent at 12/20/2021  9:42 AM EDT ----- Skin , right spinal mid lower back WELL DIFFERENTIATED SQUAMOUS CELL CARCINOMA  SCC skin cancer- already treated with EDC at time of biopsy    - please call patient

## 2021-12-27 DIAGNOSIS — E538 Deficiency of other specified B group vitamins: Secondary | ICD-10-CM | POA: Diagnosis not present

## 2022-01-15 IMAGING — MR MR KNEE*R* W/O CM
6 series · 40 of 40 positions shown · non-contrast
Comparison: None.

CLINICAL DATA: Lateral left knee pain

EXAM:
MRI OF THE RIGHT KNEE WITHOUT CONTRAST
TECHNIQUE: Multiplanar, multisequence MR imaging of the knee was performed. No
intravenous contrast was administered.

[Series 22: T2 fat-sat · axial · right · 4.0mm · 0.50mm/px · z∈[-59,+65]mm · 5 of 26 slices shown (1 of 3)]
[im 1/26]
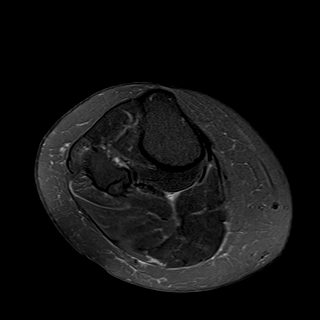
[im 7/26]
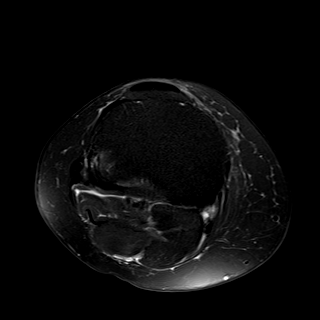
[im 13/26]
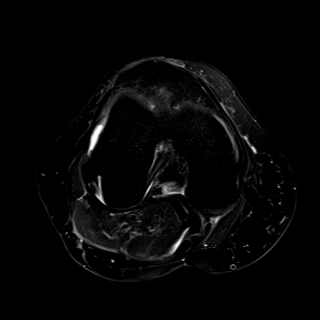
[im 19/26]
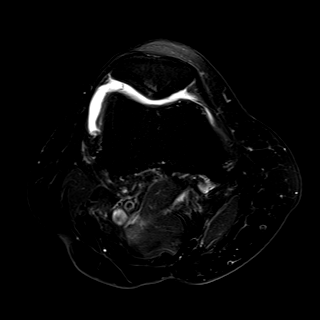
[im 26/26]
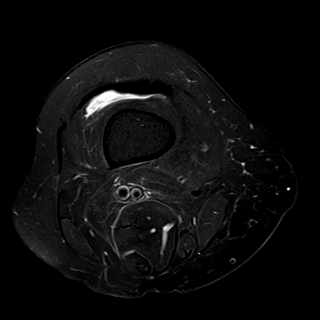

[Series 23: T2 fat-sat · coronal · right · 4.0mm · 0.59mm/px · 6 of 29 slices shown (2 of 3)]
[im 1/29]
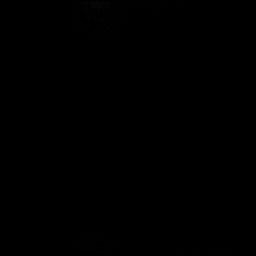
[im 6/29]
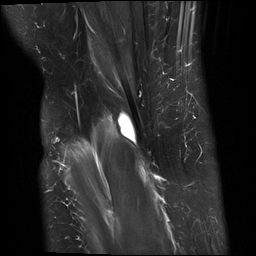
[im 12/29]
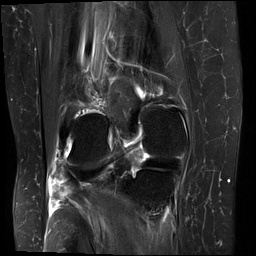
[im 17/29]
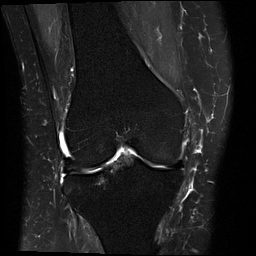
[im 23/29]
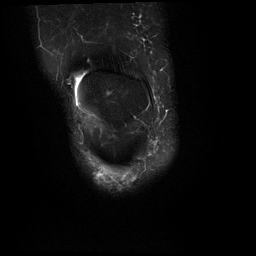
[im 29/29]
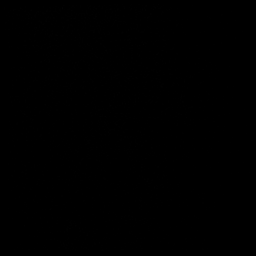

[Series 24: PD fat-sat · sagittal · right · 3.0mm · 0.59mm/px · 7 of 32 slices shown (1 of 2)]
[im 1/32]
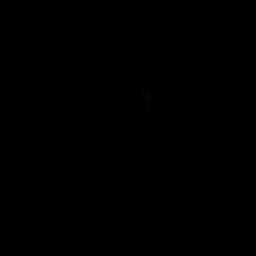
[im 6/32]
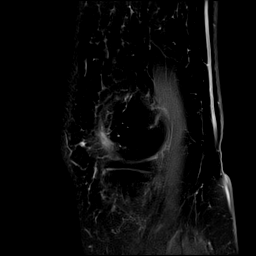
[im 11/32]
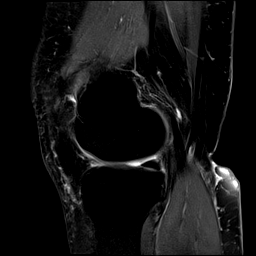
[im 16/32]
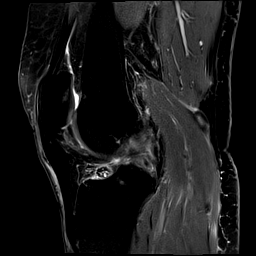
[im 21/32]
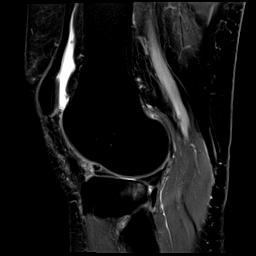
[im 26/32]
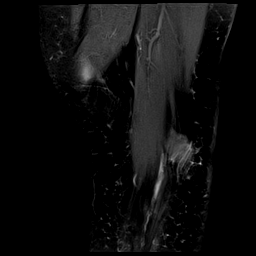
[im 32/32]
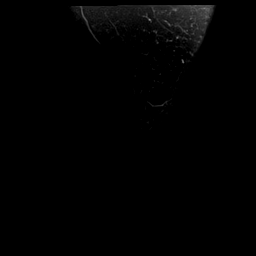

[Series 25: T1 · coronal · right · 4.0mm · 0.59mm/px · 7 of 30 slices shown]
[im 1/30]
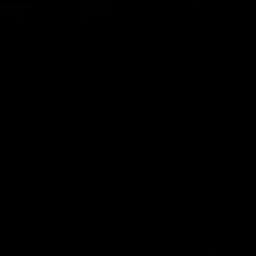
[im 5/30]
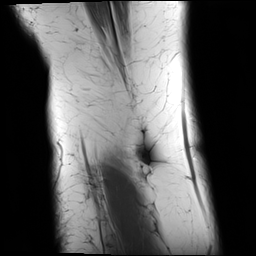
[im 10/30]
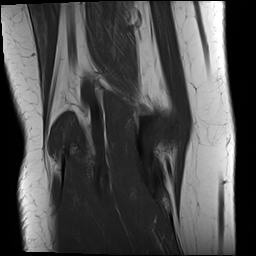
[im 15/30]
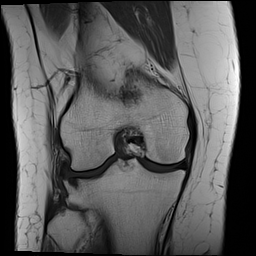
[im 20/30]
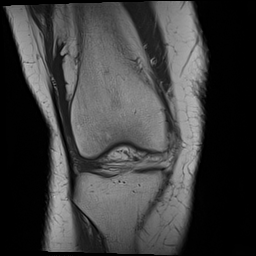
[im 25/30]
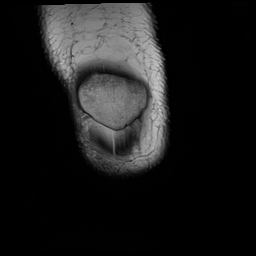
[im 30/30]
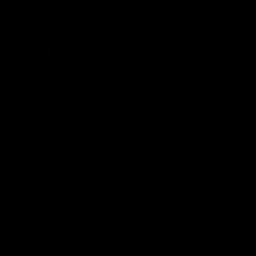

[Series 26: PD fat-sat · coronal · right · 4.0mm · 0.59mm/px · 7 of 30 slices shown (2 of 2)]
[im 1/30]
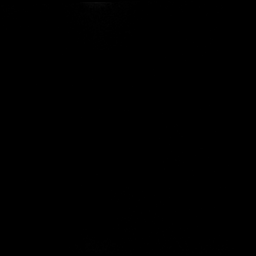
[im 5/30]
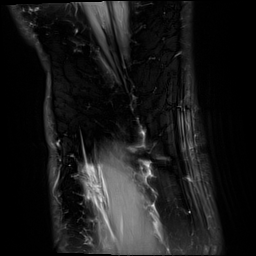
[im 10/30]
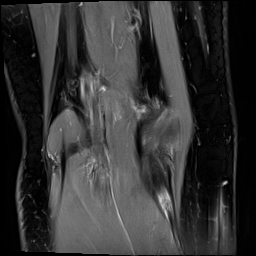
[im 15/30]
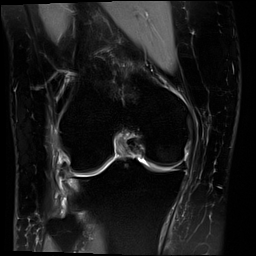
[im 20/30]
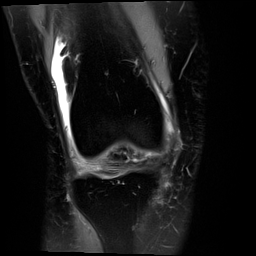
[im 25/30]
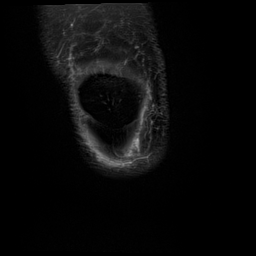
[im 30/30]
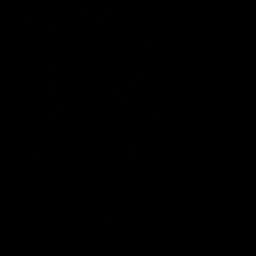

[Series 27: T2 fat-sat · sagittal · right · 3.0mm · 0.59mm/px · 8 of 36 slices shown (3 of 3)]
[im 1/36]
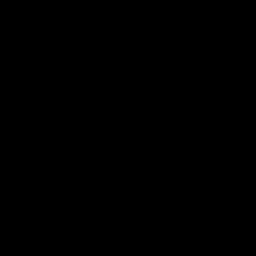
[im 6/36]
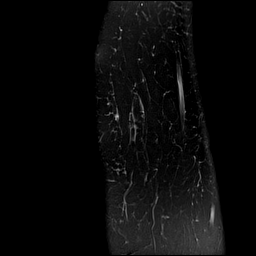
[im 11/36]
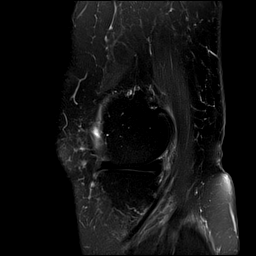
[im 16/36]
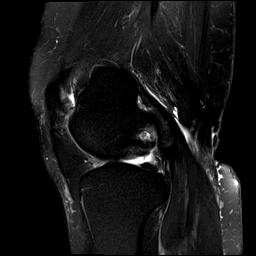
[im 21/36]
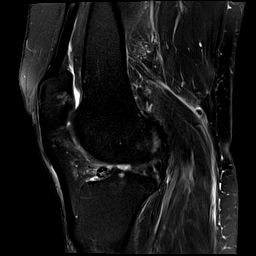
[im 26/36]
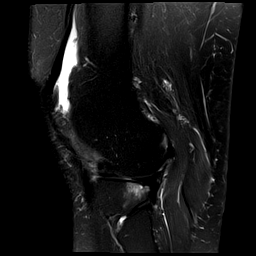
[im 31/36]
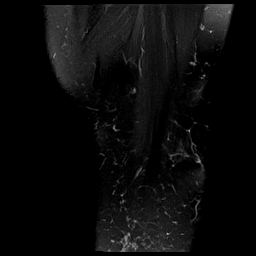
[im 36/36]
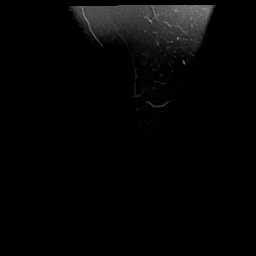

[40 of 40 positions shown; findings below may reference images not displayed]

FINDINGS: MENISCI

Medial meniscus:  Mild intrasubstance degeneration without tear.

Lateral meniscus: Complex tearing of the anterior horn and body
segments of the lateral meniscus. Prominent horizontal component
within the anterior horn (series 26, image 19). Irregular
predominantly oblique tears of the body segment with blunting of the
free edge.

LIGAMENTS

Cruciates:  Intact ACL and PCL.

Collaterals: Medial collateral ligament is intact. Lateral
collateral ligament complex is intact.

CARTILAGE

Patellofemoral: Subtle chondral fissuring at the lateral patellar
facet near the patellar apex with underlying subchondral marrow
signal changes. No trochlear chondral defect.

Medial:  No chondral defect.

Lateral: High-grade cartilage loss of the central and posterior
aspects of the lateral tibial plateau. Mild chondral thinning of the
lateral femoral condyle.

Joint:  Small knee joint effusion.  Mild edema within Hoffa's fat.

Popliteal Fossa:  Small Baker's cyst.  Intact popliteus tendon.

Extensor Mechanism:  Intact quadriceps tendon and patellar tendon.

Bones: Lateral compartment joint space narrowing. Subchondral marrow
edema at and early cyst formation at the peripheral aspect of the
lateral tibial plateau. No fracture. No suspicious bone lesion.

Other: Minimal perifascial fluid within the posterior compartment
musculature of the proximal calf.
IMPRESSION: 1. Complex tearing of the anterior horn and body segments of the
lateral meniscus.
2. Lateral compartment osteoarthritis with high-grade cartilage
loss.
3. Small knee joint effusion. Small Baker's cyst.

## 2022-01-31 DIAGNOSIS — E538 Deficiency of other specified B group vitamins: Secondary | ICD-10-CM | POA: Diagnosis not present

## 2022-02-18 DIAGNOSIS — M7581 Other shoulder lesions, right shoulder: Secondary | ICD-10-CM | POA: Diagnosis not present

## 2022-02-18 DIAGNOSIS — M25521 Pain in right elbow: Secondary | ICD-10-CM | POA: Diagnosis not present

## 2022-02-18 DIAGNOSIS — M25811 Other specified joint disorders, right shoulder: Secondary | ICD-10-CM | POA: Diagnosis not present

## 2022-02-18 DIAGNOSIS — M7711 Lateral epicondylitis, right elbow: Secondary | ICD-10-CM | POA: Diagnosis not present

## 2022-02-18 DIAGNOSIS — M25511 Pain in right shoulder: Secondary | ICD-10-CM | POA: Diagnosis not present

## 2022-02-23 DIAGNOSIS — F320 Major depressive disorder, single episode, mild: Secondary | ICD-10-CM | POA: Diagnosis not present

## 2022-02-23 DIAGNOSIS — E782 Mixed hyperlipidemia: Secondary | ICD-10-CM | POA: Diagnosis not present

## 2022-02-23 DIAGNOSIS — K219 Gastro-esophageal reflux disease without esophagitis: Secondary | ICD-10-CM | POA: Diagnosis not present

## 2022-02-23 DIAGNOSIS — Z0000 Encounter for general adult medical examination without abnormal findings: Secondary | ICD-10-CM | POA: Diagnosis not present

## 2022-02-23 DIAGNOSIS — E538 Deficiency of other specified B group vitamins: Secondary | ICD-10-CM | POA: Diagnosis not present

## 2022-02-23 DIAGNOSIS — F411 Generalized anxiety disorder: Secondary | ICD-10-CM | POA: Diagnosis not present

## 2022-02-23 DIAGNOSIS — M797 Fibromyalgia: Secondary | ICD-10-CM | POA: Diagnosis not present

## 2022-03-03 DIAGNOSIS — E538 Deficiency of other specified B group vitamins: Secondary | ICD-10-CM | POA: Diagnosis not present

## 2022-03-21 DIAGNOSIS — M7711 Lateral epicondylitis, right elbow: Secondary | ICD-10-CM | POA: Diagnosis not present

## 2022-03-21 DIAGNOSIS — M7581 Other shoulder lesions, right shoulder: Secondary | ICD-10-CM | POA: Diagnosis not present

## 2022-03-21 DIAGNOSIS — M25811 Other specified joint disorders, right shoulder: Secondary | ICD-10-CM | POA: Diagnosis not present

## 2022-03-30 DIAGNOSIS — R399 Unspecified symptoms and signs involving the genitourinary system: Secondary | ICD-10-CM | POA: Diagnosis not present

## 2022-03-30 DIAGNOSIS — R8290 Unspecified abnormal findings in urine: Secondary | ICD-10-CM | POA: Diagnosis not present

## 2022-04-04 DIAGNOSIS — E538 Deficiency of other specified B group vitamins: Secondary | ICD-10-CM | POA: Diagnosis not present

## 2022-04-13 DIAGNOSIS — M25811 Other specified joint disorders, right shoulder: Secondary | ICD-10-CM | POA: Diagnosis not present

## 2022-04-25 ENCOUNTER — Other Ambulatory Visit: Payer: Self-pay | Admitting: Medical

## 2022-04-25 DIAGNOSIS — I471 Supraventricular tachycardia, unspecified: Secondary | ICD-10-CM

## 2022-04-27 DIAGNOSIS — M25811 Other specified joint disorders, right shoulder: Secondary | ICD-10-CM | POA: Diagnosis not present

## 2022-04-27 DIAGNOSIS — M7581 Other shoulder lesions, right shoulder: Secondary | ICD-10-CM | POA: Diagnosis not present

## 2022-04-27 DIAGNOSIS — M7711 Lateral epicondylitis, right elbow: Secondary | ICD-10-CM | POA: Diagnosis not present

## 2022-04-28 DIAGNOSIS — M25811 Other specified joint disorders, right shoulder: Secondary | ICD-10-CM | POA: Diagnosis not present

## 2022-05-05 DIAGNOSIS — E538 Deficiency of other specified B group vitamins: Secondary | ICD-10-CM | POA: Diagnosis not present

## 2022-05-06 DIAGNOSIS — M25811 Other specified joint disorders, right shoulder: Secondary | ICD-10-CM | POA: Diagnosis not present

## 2022-05-10 ENCOUNTER — Encounter: Payer: Self-pay | Admitting: Dermatology

## 2022-05-10 ENCOUNTER — Ambulatory Visit (INDEPENDENT_AMBULATORY_CARE_PROVIDER_SITE_OTHER): Payer: PPO | Admitting: Dermatology

## 2022-05-10 VITALS — BP 131/81 | HR 93

## 2022-05-10 DIAGNOSIS — L814 Other melanin hyperpigmentation: Secondary | ICD-10-CM | POA: Diagnosis not present

## 2022-05-10 DIAGNOSIS — L820 Inflamed seborrheic keratosis: Secondary | ICD-10-CM | POA: Diagnosis not present

## 2022-05-10 DIAGNOSIS — Z1283 Encounter for screening for malignant neoplasm of skin: Secondary | ICD-10-CM | POA: Diagnosis not present

## 2022-05-10 DIAGNOSIS — L219 Seborrheic dermatitis, unspecified: Secondary | ICD-10-CM | POA: Diagnosis not present

## 2022-05-10 DIAGNOSIS — L821 Other seborrheic keratosis: Secondary | ICD-10-CM

## 2022-05-10 DIAGNOSIS — Z85828 Personal history of other malignant neoplasm of skin: Secondary | ICD-10-CM | POA: Diagnosis not present

## 2022-05-10 DIAGNOSIS — D1801 Hemangioma of skin and subcutaneous tissue: Secondary | ICD-10-CM

## 2022-05-10 DIAGNOSIS — D229 Melanocytic nevi, unspecified: Secondary | ICD-10-CM

## 2022-05-10 DIAGNOSIS — L578 Other skin changes due to chronic exposure to nonionizing radiation: Secondary | ICD-10-CM

## 2022-05-10 DIAGNOSIS — L570 Actinic keratosis: Secondary | ICD-10-CM | POA: Diagnosis not present

## 2022-05-10 DIAGNOSIS — L209 Atopic dermatitis, unspecified: Secondary | ICD-10-CM

## 2022-05-10 DIAGNOSIS — L910 Hypertrophic scar: Secondary | ICD-10-CM | POA: Diagnosis not present

## 2022-05-10 DIAGNOSIS — L988 Other specified disorders of the skin and subcutaneous tissue: Secondary | ICD-10-CM

## 2022-05-10 MED ORDER — TRETINOIN 0.05 % EX CREA: TOPICAL_CREAM | CUTANEOUS | 11 refills | Status: DC

## 2022-05-10 NOTE — Patient Instructions (Addendum)
To help with enlarged pores at face  Start Retin A 0.05 % cream - pea sized amount topically to face nightly   Topical retinoid medications like tretinoin/Retin-A, adapalene/Differin, tazarotene/Fabior, and Epiduo/Epiduo Forte can cause dryness and irritation when first started. Only apply a pea-sized amount to the entire affected area. Avoid applying it around the eyes, edges of mouth and creases at the nose. If you experience irritation, use a good moisturizer first and/or apply the medicine less often. If you are doing well with the medicine, you can increase how often you use it until you are applying every night. Be careful with sun protection while using this medication as it can make you sensitive to the sun. This medicine should not be used by pregnant women.     Can apply cerave / clobetasol mix cream to healing scar at right lower back and scar at [employer] area as needed for itch.    For Rough Skin   Can use at arms,  legs, and other rough areas on body  Recommend starting moisturizer with exfoliant (Urea, Salicylic acid, or Lactic acid) one to two times daily to help smooth rough and bumpy skin.  OTC options include Cetaphil Rough and Bumpy lotion (Urea), Eucerin Roughness Relief lotion or spot treatment cream (Urea), CeraVe SA lotion/cream for Rough and Bumpy skin (Sal Acid), Gold Bond Rough and Bumpy cream (Sal Acid), and AmLactin 12% lotion/cream (Lactic Acid).  If applying in morning, also apply sunscreen to sun-exposed areas, since these exfoliating moisturizers can increase sensitivity to sun.   Actinic keratoses are precancerous spots that appear secondary to cumulative UV radiation exposure/sun exposure over time. They are chronic with expected duration over 1 year. A portion of actinic keratoses will progress to squamous cell carcinoma of the skin. It is not possible to reliably predict which spots will progress to skin cancer and so treatment is recommended to prevent development  of skin cancer.  Recommend daily broad spectrum sunscreen SPF 30+ to sun-exposed areas, reapply every 2 hours as needed.  Recommend staying in the shade or wearing long sleeves, sun glasses (UVA+UVB protection) and wide brim hats (4-inch brim around the entire circumference of the hat). Call for new or changing lesions.   Cryotherapy Aftercare  Wash gently with soap and water everyday.   Apply Vaseline and Band-Aid daily until healed.   Seborrheic Keratosis  What causes seborrheic keratoses? Seborrheic keratoses are harmless, common skin growths that first appear during adult life.  As time goes by, more growths appear.  Some people may develop a large number of them.  Seborrheic keratoses appear on both covered and uncovered body parts.  They are not caused by sunlight.  The tendency to develop seborrheic keratoses can be inherited.  They vary in color from skin-colored to gray, brown, or even black.  They can be either smooth or have a rough, warty surface.   Seborrheic keratoses are superficial and look as if they were stuck on the skin.  Under the microscope this type of keratosis looks like layers upon layers of skin.  That is why at times the top layer may seem to fall off, but the rest of the growth remains and re-grows.    Treatment Seborrheic keratoses do not need to be treated, but can easily be removed in the office.  Seborrheic keratoses often cause symptoms when they rub on clothing or jewelry.  Lesions can be in the way of shaving.  If they become inflamed, they can cause itching,  soreness, or burning.  Removal of a seborrheic keratosis can be accomplished by freezing, burning, or surgery. If any spot bleeds, scabs, or grows rapidly, please return to have it checked, as these can be an indication of a skin cancer.   Melanoma ABCDEs  Melanoma is the most dangerous type of skin cancer, and is the leading cause of death from skin disease.  You are more likely to develop melanoma if  you: Have light-colored skin, light-colored eyes, or red or blond hair Spend a lot of time in the sun Tan regularly, either outdoors or in a tanning bed Have had blistering sunburns, especially during childhood Have a close family member who has had a melanoma Have atypical moles or large birthmarks  Early detection of melanoma is key since treatment is typically straightforward and cure rates are extremely high if we catch it early.   The first sign of melanoma is often a change in a mole or a new dark spot.  The ABCDE system is a way of remembering the signs of melanoma.  A for asymmetry:  The two halves do not match. B for border:  The edges of the growth are irregular. C for color:  A mixture of colors are present instead of an even brown color. D for diameter:  Melanomas are usually (but not always) greater than 21m - the size of a pencil eraser. E for evolution:  The spot keeps changing in size, shape, and color.  Please check your skin once per month between visits. You can use a small mirror in front and a large mirror behind you to keep an eye on the back side or your body.   If you see any new or changing lesions before your next follow-up, please call to schedule a visit.  Please continue daily skin protection including broad spectrum sunscreen SPF 30+ to sun-exposed areas, reapplying every 2 hours as needed when you're outdoors.   Staying in the shade or wearing long sleeves, sun glasses (UVA+UVB protection) and wide brim hats (4-inch brim around the entire circumference of the hat) are also recommended for sun protection.     Due to recent changes in healthcare laws, you may see results of your pathology and/or laboratory studies on MyChart before the doctors have had a chance to review them. We understand that in some cases there may be results that are confusing or concerning to you. Please understand that not all results are received at the same time and often the doctors  may need to interpret multiple results in order to provide you with the best plan of care or course of treatment. Therefore, we ask that you please give u[place]2 business days to thoroughly review all your results before contacting the office for clarification. Should we see a critical lab result, you will be contacted sooner.   If You Need Anything After Your Visit  If you have any questions or concerns for your doctor, please call our main line at [phone] press option 4 to reach your doctor's medical assistant. If no one answers, please leave a voicemail as directed and we will return your call as soon as possible. Messages left after 4 pm will be answered the following business day.   You may also send [place] message via [place] We typically respond to MyChart messages within 1-2 business days.  For prescription refills, please ask your pharmacy to contact our office. Our fax number is [phone]  If you have an urgent  issue when the clinic is closed that cannot wait until the next business day, you can page your doctor at the number below.    Please note that while we do our best to be available for urgent issues outside of office hours, we are not available 24/7.   If you have an urgent issue and are unable to reach [place], you may choose to seek medical care at your doctor's office, retail clinic, urgent care center, or emergency room.  If you have a medical emergency, please immediately call 911 or go to the emergency department.  Pager Numbers  - Dr. Nehemiah Massed: [phone]  - Dr. Laurence Ferrari: [phone]  - Dr. Nicole Kindred: [phone]  In the event of inclement weather, please call our main line at [phone] for an update on the status of any delays or closures.  Dermatology Medication Tips: Please keep the boxes that topical medications come in in order to help keep track of the instructions about where and how to use these. Pharmacies typically print the medication instructions  only on the boxes and not directly on the medication tubes.   If your medication is too expensive, please contact our office at [phone] option 4 or send [place] a message through [place].   We are unable to tell what your co-pay for medications will be in advance as this is different depending on your insurance coverage. However, we may be able to find a substitute medication at lower cost or fill out paperwork to get insurance to cover a needed medication.   If a prior authorization is required to get your medication covered by your insurance company, please allow [place] 1-2 business days to complete this process.  Drug prices often vary depending on where the prescription is filled and some pharmacies may offer cheaper prices.  The website www.goodrx.com contains coupons for medications through different pharmacies. The prices here do not account for what the cost may be with help from insurance (it may be cheaper with your insurance), but the website can give you the price if you did not use any insurance.  - You can print the associated coupon and take it with your prescription to the pharmacy.  - You may also stop by our office during regular business hours and pick up a GoodRx coupon card.  - If you need your prescription sent electronically to a different pharmacy, notify our office through [facility] or by phone at [phone] option 4.     Si Usted Necesita Algo Despus de Su Visita  Tambin puede enviarnos un mensaje a travs de Pharmacist, community. Por lo general respondemos a los mensajes de MyChart en el transcurso de 1 a 2 das hbiles.  Para renovar recetas, por favor pida a su farmacia que se ponga en contacto con nuestra oficina. Harland Dingwall de fax es [place] [phone].  Si tiene un asunto urgente cuando la clnica est cerrada y que no puede esperar hasta el siguiente da hbil, puede llamar/localizar a su doctor(a) al nmero que aparece a continuacin.   Por favor, tenga en  cuenta que aunque hacemos todo lo posible para estar disponibles para asuntos urgentes fuera del horario de [place], no estamos disponibles las 24 horas del da, los 7 das de la [place].   Si tiene un problema urgente y no puede comunicarse con nosotros, puede optar por buscar atencin mdica  en el consultorio de su doctor(a), en una clnica privada, en un centro de atencin urgente o en una sala de emergencias.  Si tiene Engineering geologist, por favor llame inmediatamente al 911 o vaya a la sala de emergencias.  Nmeros de bper  - Dr. Nehemiah Massed: [phone]  - Dra. Moye: [phone]  - Dra. Nicole Kindred: [phone]  En caso de inclemencias del [place], por favor llame a Johnsie Kindred principal al [phone] para una actualizacin sobre el [place] de cualquier retraso o cierre.  Consejos para la medicacin en dermatologa: Por favor, guarde las cajas en las que vienen los medicamentos de uso tpico para ayudarle a seguir las instrucciones sobre dnde y cmo usarlos. Las farmacias generalmente imprimen las instrucciones del medicamento slo en las cajas y no directamente en los tubos del [place].   Si su medicamento es muy caro, por favor, pngase en contacto con Zigmund Daniel llamando al [phone] y presione la opcin 4 o envenos un mensaje a travs de Pharmacist, community.   No podemos decirle cul ser su copago por los medicamentos por adelantado ya que esto es diferente dependiendo de la cobertura de su seguro. Sin embargo, es posible que podamos encontrar un medicamento sustituto a Electrical engineer un formulario para que el seguro cubra el medicamento que se considera necesario.   Si se requiere una autorizacin previa para que su compaa de seguros [place] su medicamento, por favor permtanos de 1 a 2 das hbiles para completar este proceso.  Los precios de los medicamentos varan con frecuencia dependiendo del Environmental consultant de dnde se surte la receta y alguna farmacias pueden ofrecer  precios ms baratos.  El sitio web www.goodrx.com tiene cupones para medicamentos de Airline pilot. Los precios aqu no tienen en cuenta lo que podra costar con la ayuda del seguro (puede ser ms barato con su seguro), pero el sitio web puede darle el precio si no utiliz Research scientist (physical sciences).  - Puede imprimir el cupn correspondiente y llevarlo con su receta a la farmacia.  - Tambin puede pasar por nuestra oficina durante el horario de atencin regular y Charity fundraiser una tarjeta de cupones de GoodRx.  - Si necesita que su receta se enve electrnicamente a una farmacia diferente, informe a nuestra oficina a travs de MyChart de [place] o por telfono llamando al [phone] y presione la opcin 4.

## 2022-05-10 NOTE — Progress Notes (Signed)
Follow-Up Visit   Subjective  Andrea Savage is a 77 y.o. female who presents for the following: Annual Exam (1 yr tbse , hx of isks, hx of seb derm, hx of atopic derm. Patient reports itchy spots at back and upper chest she would like checked. ).   The patient presents for Total-Body Skin Exam (TBSE) for skin cancer screening and mole check.  The patient has spots, moles and lesions to be evaluated, some may be new or changing and the patient has concerns that these could be cancer.  The following portions of the chart were reviewed this encounter and updated as appropriate:      Review of Systems: No other skin or systemic complaints except as noted in HPI or Assessment and Plan.   Objective  Well appearing patient in no apparent distress; mood and affect are within normal limits.  A full examination was performed including scalp, head, eyes, ears, nose, lips, neck, chest, axillae, abdomen, back, buttocks, bilateral upper extremities, bilateral lower extremities, hands, feet, fingers, toes, fingernails, and toenails. All findings within normal limits unless otherwise noted below.  Scalp Clear today  right lower back x 2, right posterior shoulder x 1, left upper back x 5, right upper back x 3, left flank x 1 (12) Erythematous stuck-on, waxy papule   right sternal notch x 1, right chest x 1 (2) Erythematous thin papules/macules with gritty scale.   inferior umbilicus, R spinal mid lower back Firn pink smooth plaque c/w hypertrophic scar  face Enlarged pores nose   Assessment & Plan  Seborrheic dermatitis Scalp  Vrs Atopic Dermatitis, with pruritus.  Chronic condition with duration or expected duration over one year. Currently well-controlled.    Seborrheic Dermatitis  -  is a chronic persistent rash characterized by pinkness and scaling most commonly of the mid face but also can occur on the scalp (dandruff), ears; mid chest, mid back and groin.  It tends to be  exacerbated by stress and cooler weather.  People who have neurologic disease may experience new onset or exacerbation of existing seborrheic dermatitis.  The condition is not curable but treatable and can be controlled.   Continue Clobetasol shampoo weekly apply to dry scalp, let sit 20 minutes and wash out  Cont Clobetasol sol 1-2gtts to aa scalp qd/bid prn itch   Topical steroids (such as triamcinolone, fluocinolone, fluocinonide, mometasone, clobetasol, halobetasol, betamethasone, hydrocortisone) can cause thinning and lightening of the skin if they are used for too long in the same area. Your physician has selected the right strength medicine for your problem and area affected on the body. Please use your medication only as directed by your physician to prevent side effects.      Atopic dermatitis, unspecified type back and abdomen  Chronic condition with duration or expected duration over one year. Currently well-controlled on topical treatment.   Atopic dermatitis (eczema) is a chronic, relapsing, pruritic condition that can significantly affect quality of life. It is often associated with allergic rhinitis and/or asthma and can require treatment with topical medications, phototherapy, or in severe cases biologic injectable medication (Dupixent; Adbry) or Oral JAK inhibitors.    Continue clobetasol/CeraVe mix twice daily as needed for flares/itch. Avoid applying to face, groin, and axilla. Use as directed. Long-term use can cause thinning of the skin.   Recommend mild soap and moisturizing cream 1-2 times daily.  Gentle skin care handout provided.  Recommend daily CeraVe anti itch moisturizer.   Inflamed seborrheic keratosis (12)  right lower back x 2, right posterior shoulder x 1, left upper back x 5, right upper back x 3, left flank x 1  Symptomatic, irritating, patient would like treated.  Destruction of lesion - right lower back x 2, right posterior shoulder x 1, left upper back x  5, right upper back x 3, left flank x 1  Destruction method: cryotherapy   Informed consent: discussed and consent obtained   Lesion destroyed using liquid nitrogen: Yes   Region frozen until ice ball extended beyond lesion: Yes   Outcome: patient tolerated procedure well with no complications   Post-procedure details: wound care instructions given   Additional details:  Prior to procedure, discussed risks of blister formation, small wound, skin dyspigmentation, or rare scar following cryotherapy. Recommend Vaseline ointment to treated areas while healing.   Actinic keratosis (2) right sternal notch x 1, right chest x 1  Actinic keratoses are precancerous spots that appear secondary to cumulative UV radiation exposure/sun exposure over time. They are chronic with expected duration over 1 year. A portion of actinic keratoses will progress to squamous cell carcinoma of the skin. It is not possible to reliably predict which spots will progress to skin cancer and so treatment is recommended to prevent development of skin cancer.  Recommend daily broad spectrum sunscreen SPF 30+ to sun-exposed areas, reapply every 2 hours as needed.  Recommend staying in the shade or wearing long sleeves, sun glasses (UVA+UVB protection) and wide brim hats (4-inch brim around the entire circumference of the hat). Call for new or changing lesions.  Destruction of lesion - right sternal notch x 1, right chest x 1  Destruction method: cryotherapy   Informed consent: discussed and consent obtained   Lesion destroyed using liquid nitrogen: Yes   Region frozen until ice ball extended beyond lesion: Yes   Outcome: patient tolerated procedure well with no complications   Post-procedure details: wound care instructions given   Additional details:  Prior to procedure, discussed risks of blister formation, small wound, skin dyspigmentation, or rare scar following cryotherapy. Recommend Vaseline ointment to treated areas  while healing.   Hypertrophic scar inferior umbilicus, R spinal mid lower back  Benign, observe.    Discussed [place] steroid to help with itch, pt defers at this time  Can also use clobetasol / cerave mix cream qd/bid prn itch  Elastosis of skin face  Start tretinoin 0.05% cream qhs face as tolerated  Topical retinoid medications like tretinoin/Retin-A, adapalene/Differin, tazarotene/Fabior, and Epiduo/Epiduo Forte can cause dryness and irritation when first started. Only apply a pea-sized amount to the entire affected area. Avoid applying it around the eyes, edges of mouth and creases at the nose. If you experience irritation, use a good moisturizer first and/or apply the medicine less often. If you are doing well with the medicine, you can increase how often you use it until you are applying every night. Be careful with sun protection while using this medication as it can make you sensitive to the sun. This medicine should not be used by pregnant women.    tretinoin (RETIN-A) 0.05 % cream - face Apply pea sized amount to face qhs  Lentigines - Scattered tan macules - Due to sun exposure - Benign-appearing, observe - Recommend daily broad spectrum sunscreen SPF 30+ to sun-exposed areas, reapply every 2 hours as needed. - Call for any changes  Seborrheic Keratoses - Stuck-on, waxy, tan-brown papules and/or plaques  - Benign-appearing - Discussed benign etiology and prognosis. - Observe -  Call for any changes  Sebaceous Hyperplasia At face - Small yellow papules with a central dell - Benign - Observe  Melanocytic Nevi - Tan-brown and/or pink-flesh-colored symmetric macules and papules - Benign appearing on exam today - Observation - Call clinic for new or changing moles - Recommend daily use of broad spectrum spf 30+ sunscreen to sun-exposed areas.   Hemangiomas - Red papules - Discussed benign nature - Observe - Call for any changes  Actinic Damage - Chronic  condition, secondary to cumulative UV/sun exposure - diffuse scaly erythematous macules with underlying dyspigmentation - Recommend daily broad spectrum sunscreen SPF 30+ to sun-exposed areas, reapply every 2 hours as needed.  - Staying in the shade or wearing long sleeves, sun glasses (UVA+UVB protection) and wide brim hats (4-inch brim around the entire circumference of the hat) are also recommended for sun protection.  - Call for new or changing lesions.  History of Squamous Cell Carcinoma of the Skin Right spinal mid lower back [facility] 12/2021 With Hypertrophic scar - No evidence of recurrence today - Recommend regular full body skin exams - Recommend daily broad spectrum sunscreen SPF 30+ to sun-exposed areas, reapply every 2 hours as needed.  - Call if any new or changing lesions are noted between office visits  Skin cancer screening performed today. Return in about 1 year (around 05/10/2023) for TBSE.  I, Ruthell Rummage, CMA, am acting as scribe for Brendolyn Patty, MD.  Documentation: I have reviewed the above documentation for accuracy and completeness, and I agree with the above.  Brendolyn Patty MD

## 2022-05-11 DIAGNOSIS — M25811 Other specified joint disorders, right shoulder: Secondary | ICD-10-CM | POA: Diagnosis not present

## 2022-05-18 DIAGNOSIS — M25811 Other specified joint disorders, right shoulder: Secondary | ICD-10-CM | POA: Diagnosis not present

## 2022-05-24 DIAGNOSIS — M25811 Other specified joint disorders, right shoulder: Secondary | ICD-10-CM | POA: Diagnosis not present

## 2022-05-30 DIAGNOSIS — R053 Chronic cough: Secondary | ICD-10-CM | POA: Diagnosis not present

## 2022-05-30 DIAGNOSIS — I5032 Chronic diastolic (congestive) heart failure: Secondary | ICD-10-CM | POA: Diagnosis not present

## 2022-05-30 DIAGNOSIS — I739 Peripheral vascular disease, unspecified: Secondary | ICD-10-CM | POA: Diagnosis not present

## 2022-05-30 DIAGNOSIS — F411 Generalized anxiety disorder: Secondary | ICD-10-CM | POA: Diagnosis not present

## 2022-05-30 DIAGNOSIS — K219 Gastro-esophageal reflux disease without esophagitis: Secondary | ICD-10-CM | POA: Diagnosis not present

## 2022-05-30 DIAGNOSIS — J383 Other diseases of vocal cords: Secondary | ICD-10-CM | POA: Diagnosis not present

## 2022-05-30 DIAGNOSIS — F320 Major depressive disorder, single episode, mild: Secondary | ICD-10-CM | POA: Diagnosis not present

## 2022-05-30 DIAGNOSIS — M797 Fibromyalgia: Secondary | ICD-10-CM | POA: Diagnosis not present

## 2022-06-13 DIAGNOSIS — E538 Deficiency of other specified B group vitamins: Secondary | ICD-10-CM | POA: Diagnosis not present

## 2022-06-15 DIAGNOSIS — M25811 Other specified joint disorders, right shoulder: Secondary | ICD-10-CM | POA: Diagnosis not present

## 2022-06-29 DIAGNOSIS — M25811 Other specified joint disorders, right shoulder: Secondary | ICD-10-CM | POA: Diagnosis not present

## 2022-07-04 DIAGNOSIS — H1131 Conjunctival hemorrhage, right eye: Secondary | ICD-10-CM | POA: Diagnosis not present

## 2022-07-06 DIAGNOSIS — M25811 Other specified joint disorders, right shoulder: Secondary | ICD-10-CM | POA: Diagnosis not present

## 2022-07-14 DIAGNOSIS — E538 Deficiency of other specified B group vitamins: Secondary | ICD-10-CM | POA: Diagnosis not present

## 2022-08-15 DIAGNOSIS — J329 Chronic sinusitis, unspecified: Secondary | ICD-10-CM | POA: Diagnosis not present

## 2022-08-15 DIAGNOSIS — J385 Laryngeal spasm: Secondary | ICD-10-CM | POA: Diagnosis not present

## 2022-08-15 DIAGNOSIS — R053 Chronic cough: Secondary | ICD-10-CM | POA: Diagnosis not present

## 2022-08-15 DIAGNOSIS — R490 Dysphonia: Secondary | ICD-10-CM | POA: Diagnosis not present

## 2022-08-16 DIAGNOSIS — A09 Infectious gastroenteritis and colitis, unspecified: Secondary | ICD-10-CM | POA: Diagnosis not present

## 2022-08-16 DIAGNOSIS — R053 Chronic cough: Secondary | ICD-10-CM | POA: Diagnosis not present

## 2022-08-16 DIAGNOSIS — I471 Supraventricular tachycardia, unspecified: Secondary | ICD-10-CM | POA: Diagnosis not present

## 2022-08-16 DIAGNOSIS — K219 Gastro-esophageal reflux disease without esophagitis: Secondary | ICD-10-CM | POA: Diagnosis not present

## 2022-08-16 DIAGNOSIS — F411 Generalized anxiety disorder: Secondary | ICD-10-CM | POA: Diagnosis not present

## 2022-08-22 DIAGNOSIS — E538 Deficiency of other specified B group vitamins: Secondary | ICD-10-CM | POA: Diagnosis not present

## 2022-09-06 DIAGNOSIS — F411 Generalized anxiety disorder: Secondary | ICD-10-CM | POA: Diagnosis not present

## 2022-09-06 DIAGNOSIS — K219 Gastro-esophageal reflux disease without esophagitis: Secondary | ICD-10-CM | POA: Diagnosis not present

## 2022-09-06 DIAGNOSIS — A09 Infectious gastroenteritis and colitis, unspecified: Secondary | ICD-10-CM | POA: Diagnosis not present

## 2022-09-06 DIAGNOSIS — R053 Chronic cough: Secondary | ICD-10-CM | POA: Diagnosis not present

## 2022-09-12 DIAGNOSIS — M7062 Trochanteric bursitis, left hip: Secondary | ICD-10-CM | POA: Diagnosis not present

## 2022-09-12 DIAGNOSIS — S76012A Strain of muscle, fascia and tendon of left hip, initial encounter: Secondary | ICD-10-CM | POA: Diagnosis not present

## 2022-09-12 DIAGNOSIS — M7581 Other shoulder lesions, right shoulder: Secondary | ICD-10-CM | POA: Diagnosis not present

## 2022-09-12 DIAGNOSIS — M25811 Other specified joint disorders, right shoulder: Secondary | ICD-10-CM | POA: Diagnosis not present

## 2022-09-21 DIAGNOSIS — R053 Chronic cough: Secondary | ICD-10-CM | POA: Diagnosis not present

## 2022-09-21 DIAGNOSIS — J385 Laryngeal spasm: Secondary | ICD-10-CM | POA: Diagnosis not present

## 2022-10-04 ENCOUNTER — Other Ambulatory Visit (HOSPITAL_COMMUNITY): Payer: Self-pay

## 2022-10-04 ENCOUNTER — Telehealth: Payer: Self-pay

## 2022-10-04 NOTE — Telephone Encounter (Signed)
Pharmacy Patient Advocate Encounter   Received notification from CoverMyMeds that prior authorization for NEXLIZET  is required/requested.   Insurance verification completed.   The patient is insured through [employer] [facility] .   Per test claim: PA required; PA submitted to [employer] [facility] via CoverMyMeds Key/confirmation #/EOC [id number]        Status is pending

## 2022-10-05 NOTE — Telephone Encounter (Signed)
Pharmacy Patient Advocate Encounter  Received notification from [employer] [facility] that Prior Authorization for NEXLIZET 180-10MG  has been  Approved 7.30.24 to 7.30.27

## 2022-10-05 NOTE — Telephone Encounter (Signed)
Pharmacy Patient Advocate Encounter  Received notification from [employer] [facility] that Prior Authorization for NEXLIZET 180-10MG  has been  APPROVED THROUGH 7.29.27

## 2022-10-18 DIAGNOSIS — E538 Deficiency of other specified B group vitamins: Secondary | ICD-10-CM | POA: Diagnosis not present

## 2022-10-19 DIAGNOSIS — R053 Chronic cough: Secondary | ICD-10-CM | POA: Diagnosis not present

## 2022-10-19 DIAGNOSIS — R1310 Dysphagia, unspecified: Secondary | ICD-10-CM | POA: Diagnosis not present

## 2022-10-27 DIAGNOSIS — K769 Liver disease, unspecified: Secondary | ICD-10-CM | POA: Diagnosis not present

## 2022-10-27 DIAGNOSIS — R053 Chronic cough: Secondary | ICD-10-CM | POA: Diagnosis not present

## 2022-11-18 DIAGNOSIS — E538 Deficiency of other specified B group vitamins: Secondary | ICD-10-CM | POA: Diagnosis not present

## 2022-12-05 DIAGNOSIS — J385 Laryngeal spasm: Secondary | ICD-10-CM | POA: Diagnosis not present

## 2022-12-19 DIAGNOSIS — F411 Generalized anxiety disorder: Secondary | ICD-10-CM | POA: Diagnosis not present

## 2022-12-19 DIAGNOSIS — J208 Acute bronchitis due to other specified organisms: Secondary | ICD-10-CM | POA: Diagnosis not present

## 2022-12-19 DIAGNOSIS — R0981 Nasal congestion: Secondary | ICD-10-CM | POA: Diagnosis not present

## 2022-12-19 DIAGNOSIS — J028 Acute pharyngitis due to other specified organisms: Secondary | ICD-10-CM | POA: Diagnosis not present

## 2022-12-19 DIAGNOSIS — M797 Fibromyalgia: Secondary | ICD-10-CM | POA: Diagnosis not present

## 2022-12-19 DIAGNOSIS — B9789 Other viral agents as the cause of diseases classified elsewhere: Secondary | ICD-10-CM | POA: Diagnosis not present

## 2022-12-19 DIAGNOSIS — K219 Gastro-esophageal reflux disease without esophagitis: Secondary | ICD-10-CM | POA: Diagnosis not present

## 2023-01-13 ENCOUNTER — Other Ambulatory Visit: Payer: Self-pay | Admitting: Medical

## 2023-01-13 DIAGNOSIS — I471 Supraventricular tachycardia, unspecified: Secondary | ICD-10-CM

## 2023-01-17 DIAGNOSIS — E538 Deficiency of other specified B group vitamins: Secondary | ICD-10-CM | POA: Diagnosis not present

## 2023-02-18 NOTE — Progress Notes (Unsigned)
Cardiology Clinic Note   Date: 02/20/2023 ID: Kerri-Ann, Wildes [birth year], MRN [id number]  Primary Cardiologist:  Yvonne Kendall, MD  Patient Profile    Andrea Savage is a 77 y.o. female who presents to the clinic today for routine follow up.     Past medical history significant for: Chronic diastolic heart failure. Echo 05/24/2021: EF 60 to 65%.  No RWMA.  Grade I DD.  Normal global strain.  Normal RV size/function.  Moderate MR.  Mild AI. Hypertension. Hyperlipidemia. Lipid panel 02/23/2022: LDL 66, HDL 68, TG 165, total 167. PSVT. 14-day ZIO 10/24/2017: HR 53 to 135 bpm, average 76 bpm.  Rare PACs/PVCs.  33 atrial runs with max rate 203 bpm, longest lasting 17.3 seconds.  No sustained arrhythmia or prolonged pauses identified.  Triggered events corresponded to sinus rhythm, PACs, and atrial runs. GERD. Fibromyalgia.  In summary, patient was first evaluated by Dr. Okey Dupre on 09/13/2017 for near syncope/syncope and shortness of breath.  3 days prior to her visit she had been evaluated in the emergency department for shortness of breath felt to be anxiety.  She was pending foot surgery.  Nuclear stress testing for risk stratification was a normal low risk study.  Echo showed normal LV function with mild MR.  14-day ZIO showed runs of SVT as detailed above.     History of Present Illness    Andrea Savage is followed by Dr. Okey Dupre for the above outlined history.  Patient was last seen in the office by Dr. Okey Dupre on 12/17/2021 for routine follow-up.  She was doing well at that time with no cardiac complaints.  She mentioned a chronic persistent cough for greater than 15 years with extensive prior workup.  She was encouraged to continue management with PCP.  Today, patient is here alone. She reports increased DOE over the last few months. She has chronic pain including left knee pain. Right knee was replaced 2 years ago. She has noticed lower extremity edema that she attributes to  her knees. She tried starting a seated exercise program before Thanksgiving but has been inconsistent with it. She is hoping to start physical therapy for her left knee soon. She reports left flank/lower chest pain right under left breast that occurs randomly, lasting up to 10 minutes, and resolving on its own. There is no pattern to the pain. It does not typically occur when she is walking or exerting herself.  She continues to have occasional palpitations described as heart racing. The episodes are typically brief lasting just a few seconds and will occasionally be triggered by her chronic cough.      ROS: All other systems reviewed and are otherwise negative except as noted in History of Present Illness.  Studies Reviewed    EKG Interpretation Date/Time:  Monday February 20 2023 09:22:24 EST Ventricular Rate:  74 PR Interval:  146 QRS Duration:  76 QT Interval:  392 QTC Calculation: 435 R Axis:   6  Text Interpretation: Normal sinus rhythm Normal ECG When compared with ECG of 24-Mar-2021 14:28, RSR' pattern in V1 is no longer Present Confirmed by Carlos Levering [phone]) on 02/20/2023 9:29:11 AM           Physical Exam    VS:  BP 110/70 (BP Location: Left Arm, Patient Position: Sitting, Cuff Size: Normal)   Pulse 74   Ht 5' 4.5" (1.638 m)   Wt 166 lb 6 oz (75.5 kg)   SpO2 98%   BMI 28.12 kg/m  ,  BMI Body mass index is 28.12 kg/m.  GEN: Well nourished, well developed, in no acute distress. Neck: No JVD or carotid bruits. Cardiac: RRR. No murmurs. No rubs or gallops.   Respiratory:  Respirations regular and unlabored. Clear to auscultation without rales, wheezing or rhonchi. GI: Soft, nontender, nondistended. Extremities: Radials/DP/PT 2+ and equal bilaterally. No clubbing or cyanosis. Mild, nonpitting lower extremity edema bilaterally.   Skin: Warm and dry, no rash. Neuro: Strength intact.  Assessment & Plan   Chronic diastolic heart failure Echo March 2023 showed  normal LV/RV function, Grade I DD, moderate MR, mild AI.  Patient reports bilateral lower extremity edema she attributes to bilateral knee pain. She reports increased DOE over the last several months. She wonders if it is secondary to deconditioning, as she has not been active secondary to her chronic pain and flared left knee and hip pain. She is hoping to get a knee injection next week and possibly start PT. She has mild, nonpitting edema bilateral lower extremities on exam today.  Euvolemic and well compensated.  -Continue Toprol, Lasix as needed. -Echo for further evaluation of DOE.   Palpitations/PSVT 14 day Zio August 2019 showed 33 atrial runs. She denies sustained palpitations. She will occasionally feel her heart racing but this is typically brief lasting a few seconds and resolving on its own. Palpitations are occasionally triggered by chronic cough. She is instructed to take an extra 1/2 of Toprol if she has sustained palpitations. EKG is NSR. No ectopy on exam today.  -Continue Toprol.   Hypertension BP today 110/70.  -Continue Toprol  Hyperlipidemia LDL December 2024 66, at goal. -Continue Zetia.  Disposition: Echo. Return in 6 months or sooner as needed.          Signed, Etta Grandchild. Aleida Crandell, DNP, NP-C

## 2023-02-20 ENCOUNTER — Ambulatory Visit: Payer: PPO | Attending: Student | Admitting: Student

## 2023-02-20 ENCOUNTER — Encounter: Payer: Self-pay | Admitting: Student

## 2023-02-20 VITALS — BP 110/70 | HR 74 | Ht 64.5 in | Wt 166.4 lb

## 2023-02-20 DIAGNOSIS — I471 Supraventricular tachycardia, unspecified: Secondary | ICD-10-CM

## 2023-02-20 DIAGNOSIS — I10 Essential (primary) hypertension: Secondary | ICD-10-CM | POA: Diagnosis not present

## 2023-02-20 DIAGNOSIS — R0609 Other forms of dyspnea: Secondary | ICD-10-CM

## 2023-02-20 DIAGNOSIS — I5032 Chronic diastolic (congestive) heart failure: Secondary | ICD-10-CM | POA: Diagnosis not present

## 2023-02-20 DIAGNOSIS — I340 Nonrheumatic mitral (valve) insufficiency: Secondary | ICD-10-CM | POA: Diagnosis not present

## 2023-02-20 DIAGNOSIS — E785 Hyperlipidemia, unspecified: Secondary | ICD-10-CM

## 2023-02-20 NOTE — Patient Instructions (Signed)
Medication Instructions:  No changes at this time.   *If you need a refill on your cardiac medications before your next appointment, please call your pharmacy*   Lab Work: None  If you have labs (blood work) drawn today and your tests are completely normal, you will receive your results only by: MyChart Message (if you have MyChart) OR A paper copy in the mail If you have any lab test that is abnormal or we need to change your treatment, we will call you to review the results.   Testing/Procedures: Your physician has requested that you have an echocardiogram. Echocardiography is a painless test that uses sound waves to create images of your heart. It provides your doctor with information about the size and shape of your heart and how well your heart's chambers and valves are working. This procedure takes approximately one hour. There are no restrictions for this procedure. Please do NOT wear cologne, perfume, aftershave, or lotions (deodorant is allowed). Please arrive 15 minutes prior to your appointment time.  Please note: We ask at that you not bring children with you during ultrasound (echo/ vascular) testing. Due to room size and safety concerns, children are not allowed in the ultrasound rooms during exams. Our front office staff cannot provide observation of children in our lobby area while testing is being conducted. An adult accompanying a patient to their appointment will only be allowed in the ultrasound room at the discretion of the ultrasound technician under special circumstances. We apologize for any inconvenience.    Follow-Up: At [facility], you and your health needs are our priority.  As part of our continuing mission to provide you with exceptional heart care, we have created designated Provider Care Teams.  These Care Teams include your primary Cardiologist (physician) and Advanced Practice Providers (APPs -  Physician Assistants and Nurse Practitioners) who  all work together to provide you with the care you need, when you need it.   Your next appointment:   6 month(s)  Provider:   Yvonne Kendall, MD or Carlos Levering, NP

## 2023-02-21 DIAGNOSIS — E538 Deficiency of other specified B group vitamins: Secondary | ICD-10-CM | POA: Diagnosis not present

## 2023-02-24 DIAGNOSIS — M1712 Unilateral primary osteoarthritis, left knee: Secondary | ICD-10-CM | POA: Diagnosis not present

## 2023-03-09 ENCOUNTER — Ambulatory Visit: Payer: PPO | Attending: Student

## 2023-03-09 DIAGNOSIS — R0609 Other forms of dyspnea: Secondary | ICD-10-CM | POA: Diagnosis not present

## 2023-03-09 LAB — ECHOCARDIOGRAM COMPLETE
AR max vel: 2.56 cm2
AV Area VTI: 2.51 cm2
AV Area mean vel: 2.49 cm2
AV Mean grad: 4.5 mm[Hg]
AV Peak grad: 8.5 mm[Hg]
Ao pk vel: 1.46 m/s
Area-P 1/2: 2.8 cm2
Calc EF: 58.2 %
S' Lateral: 2.3 cm
Single Plane A2C EF: 55 %
Single Plane A4C EF: 57.6 %

## 2023-03-20 ENCOUNTER — Ambulatory Visit: Payer: PPO | Attending: Student

## 2023-03-20 DIAGNOSIS — M25562 Pain in left knee: Secondary | ICD-10-CM

## 2023-03-20 DIAGNOSIS — R262 Difficulty in walking, not elsewhere classified: Secondary | ICD-10-CM | POA: Diagnosis not present

## 2023-03-20 DIAGNOSIS — M1712 Unilateral primary osteoarthritis, left knee: Secondary | ICD-10-CM | POA: Diagnosis not present

## 2023-03-20 DIAGNOSIS — R2681 Unsteadiness on feet: Secondary | ICD-10-CM | POA: Diagnosis not present

## 2023-03-20 DIAGNOSIS — M6281 Muscle weakness (generalized): Secondary | ICD-10-CM | POA: Diagnosis not present
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
## 2023-03-22 ENCOUNTER — Ambulatory Visit: Payer: PPO

## 2023-03-22 DIAGNOSIS — M25562 Pain in left knee: Secondary | ICD-10-CM

## 2023-03-22 DIAGNOSIS — M1712 Unilateral primary osteoarthritis, left knee: Secondary | ICD-10-CM | POA: Diagnosis not present

## 2023-03-22 DIAGNOSIS — M6281 Muscle weakness (generalized): Secondary | ICD-10-CM
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
## 2023-03-27 ENCOUNTER — Ambulatory Visit: Payer: PPO

## 2023-03-27 DIAGNOSIS — M25562 Pain in left knee: Secondary | ICD-10-CM

## 2023-03-27 DIAGNOSIS — M1712 Unilateral primary osteoarthritis, left knee: Secondary | ICD-10-CM | POA: Diagnosis not present

## 2023-03-27 DIAGNOSIS — M6281 Muscle weakness (generalized): Secondary | ICD-10-CM
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
## 2023-03-29 ENCOUNTER — Ambulatory Visit: Payer: PPO

## 2023-03-29 DIAGNOSIS — R262 Difficulty in walking, not elsewhere classified: Secondary | ICD-10-CM

## 2023-03-29 DIAGNOSIS — M1712 Unilateral primary osteoarthritis, left knee: Secondary | ICD-10-CM

## 2023-03-29 DIAGNOSIS — M6281 Muscle weakness (generalized): Secondary | ICD-10-CM

## 2023-03-29 DIAGNOSIS — M25562 Pain in left knee: Secondary | ICD-10-CM

## 2023-03-29 DIAGNOSIS — R2681 Unsteadiness on feet: Secondary | ICD-10-CM
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
## 2023-03-31 ENCOUNTER — Telehealth: Payer: Self-pay | Admitting: Internal Medicine

## 2023-03-31 NOTE — Telephone Encounter (Signed)
Pt c/o Shortness Of Breath: STAT if SOB developed within the last 24 hours or pt is noticeably SOB on the phone  1. Are you currently SOB (can you hear that pt is SOB on the phone)? No   2. How long have you been experiencing SOB? Started last night is the only time this happened   3. Are you SOB when sitting or when up moving around? Sitting   4. Are you currently experiencing any other symptoms?   Patient states that she was laying down last night and was woke up with pain in hip that sent a warm sensation down her leg and back up to her head causing a swimming type feeling in her head. She also states that she felt that she had to take a big inhale of breath during this time and for a little while after until the sensations went away. Denies any chest pain.

## 2023-03-31 NOTE — Telephone Encounter (Signed)
Received a call from patient stating that last night around 2 AM she woke up and it was having some odd symptoms she felt she wanted to make [place] aware of. She had hip pain that sent a warm sensation down her leg, and then back up to her head making her feels lightheaded, and felt a bit short of breath as well. She stated the feeling went away and she was able to lay back down and went back to sleep. When she woke up this morning she was feeling fine. She denied chest pain, palpitations. She did not check her BP and HR during this episode, however I advised with her if she was feeling okay now and this was an isolated event, if it happened again I recommended to check her BP/HR during these episodes to see if anything was low or high. Patient verbalized understanding. She will monitor if this happens again.

## 2023-04-03 ENCOUNTER — Ambulatory Visit: Payer: PPO

## 2023-04-05 ENCOUNTER — Ambulatory Visit: Payer: PPO

## 2023-04-05 DIAGNOSIS — M1712 Unilateral primary osteoarthritis, left knee: Secondary | ICD-10-CM | POA: Diagnosis not present

## 2023-04-05 DIAGNOSIS — M6281 Muscle weakness (generalized): Secondary | ICD-10-CM

## 2023-04-05 DIAGNOSIS — R262 Difficulty in walking, not elsewhere classified: Secondary | ICD-10-CM
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
## 2023-04-10 ENCOUNTER — Ambulatory Visit: Payer: PPO

## 2023-04-10 ENCOUNTER — Other Ambulatory Visit: Payer: Self-pay | Admitting: Internal Medicine

## 2023-04-10 DIAGNOSIS — J208 Acute bronchitis due to other specified organisms: Secondary | ICD-10-CM | POA: Diagnosis not present

## 2023-04-10 DIAGNOSIS — J4520 Mild intermittent asthma, uncomplicated: Secondary | ICD-10-CM | POA: Diagnosis not present

## 2023-04-10 DIAGNOSIS — I471 Supraventricular tachycardia, unspecified: Secondary | ICD-10-CM

## 2023-04-12 DIAGNOSIS — R053 Chronic cough: Secondary | ICD-10-CM | POA: Diagnosis not present

## 2023-04-12 DIAGNOSIS — K219 Gastro-esophageal reflux disease without esophagitis: Secondary | ICD-10-CM | POA: Diagnosis not present

## 2023-04-12 DIAGNOSIS — R1310 Dysphagia, unspecified: Secondary | ICD-10-CM | POA: Diagnosis not present

## 2023-04-12 DIAGNOSIS — J45909 Unspecified asthma, uncomplicated: Secondary | ICD-10-CM | POA: Diagnosis not present

## 2023-04-12 DIAGNOSIS — J4520 Mild intermittent asthma, uncomplicated: Secondary | ICD-10-CM | POA: Diagnosis not present

## 2023-04-13 DIAGNOSIS — R053 Chronic cough: Secondary | ICD-10-CM | POA: Diagnosis not present

## 2023-04-17 ENCOUNTER — Ambulatory Visit: Payer: PPO | Attending: Student

## 2023-04-17 DIAGNOSIS — M25562 Pain in left knee: Secondary | ICD-10-CM | POA: Insufficient documentation

## 2023-04-17 DIAGNOSIS — M1712 Unilateral primary osteoarthritis, left knee: Secondary | ICD-10-CM | POA: Diagnosis not present

## 2023-04-17 DIAGNOSIS — R262 Difficulty in walking, not elsewhere classified: Secondary | ICD-10-CM | POA: Insufficient documentation

## 2023-04-17 DIAGNOSIS — R2681 Unsteadiness on feet: Secondary | ICD-10-CM | POA: Diagnosis not present

## 2023-04-17 DIAGNOSIS — M6281 Muscle weakness (generalized): Secondary | ICD-10-CM | POA: Diagnosis not present
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
## 2023-04-19 ENCOUNTER — Telehealth: Payer: Self-pay | Admitting: Internal Medicine

## 2023-04-19 ENCOUNTER — Ambulatory Visit: Payer: PPO

## 2023-04-19 DIAGNOSIS — R262 Difficulty in walking, not elsewhere classified: Secondary | ICD-10-CM

## 2023-04-19 DIAGNOSIS — M6281 Muscle weakness (generalized): Secondary | ICD-10-CM

## 2023-04-19 DIAGNOSIS — I471 Supraventricular tachycardia, unspecified: Secondary | ICD-10-CM

## 2023-04-19 DIAGNOSIS — M1712 Unilateral primary osteoarthritis, left knee: Secondary | ICD-10-CM

## 2023-04-19 DIAGNOSIS — M25562 Pain in left knee: Secondary | ICD-10-CM

## 2023-04-19 MED ORDER — METOPROLOL SUCCINATE ER 25 MG PO TB24: 25.0000 mg | ORAL_TABLET | Freq: Every day | ORAL | 3 refills | Status: DC

## 2023-04-19 NOTE — Telephone Encounter (Signed)
*  STAT* If patient is at the pharmacy, call can be transferred to refill team.   1. Which medications need to be refilled? (please list name of each medication and dose if known)   metoprolol succinate (TOPROL-XL) 25 MG 24 hr tablet   2. Would you like to learn more about the convenience, safety, & potential cost savings by using the [facility]?   3. Are you open to using the [facility] (Type [facility]. ).  4. Which pharmacy/location (including street and city if local pharmacy) is medication to be sent to?  [id number] - [place], [place] - [address]   5. Do they need a 30 day or 90 day supply?   90 day  Patient stated she has just enough medication for the end of this week.
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
## 2023-04-19 NOTE — Telephone Encounter (Signed)
Pt's medication was sent to pt's pharmacy as requested. Confirmation received.

## 2023-04-24 ENCOUNTER — Ambulatory Visit: Payer: PPO

## 2023-04-24 DIAGNOSIS — R262 Difficulty in walking, not elsewhere classified: Secondary | ICD-10-CM

## 2023-04-24 DIAGNOSIS — M6281 Muscle weakness (generalized): Secondary | ICD-10-CM

## 2023-04-24 DIAGNOSIS — M1712 Unilateral primary osteoarthritis, left knee: Secondary | ICD-10-CM

## 2023-04-24 DIAGNOSIS — M25562 Pain in left knee: Secondary | ICD-10-CM
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
## 2023-04-26 ENCOUNTER — Ambulatory Visit: Payer: PPO

## 2023-05-01 ENCOUNTER — Ambulatory Visit: Payer: PPO

## 2023-05-01 DIAGNOSIS — M25562 Pain in left knee: Secondary | ICD-10-CM

## 2023-05-01 DIAGNOSIS — R262 Difficulty in walking, not elsewhere classified: Secondary | ICD-10-CM | POA: Diagnosis not present

## 2023-05-01 DIAGNOSIS — R2681 Unsteadiness on feet: Secondary | ICD-10-CM

## 2023-05-01 DIAGNOSIS — M6281 Muscle weakness (generalized): Secondary | ICD-10-CM

## 2023-05-01 DIAGNOSIS — M1712 Unilateral primary osteoarthritis, left knee: Secondary | ICD-10-CM
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
## 2023-05-03 ENCOUNTER — Ambulatory Visit: Payer: PPO

## 2023-05-03 DIAGNOSIS — M25562 Pain in left knee: Secondary | ICD-10-CM

## 2023-05-03 DIAGNOSIS — R2681 Unsteadiness on feet: Secondary | ICD-10-CM

## 2023-05-03 DIAGNOSIS — R262 Difficulty in walking, not elsewhere classified: Secondary | ICD-10-CM

## 2023-05-03 DIAGNOSIS — M6281 Muscle weakness (generalized): Secondary | ICD-10-CM

## 2023-05-03 DIAGNOSIS — M1712 Unilateral primary osteoarthritis, left knee: Secondary | ICD-10-CM
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
## 2023-05-09 ENCOUNTER — Ambulatory Visit: Payer: PPO | Admitting: Dermatology

## 2023-05-09 ENCOUNTER — Encounter: Payer: Self-pay | Admitting: Dermatology

## 2023-05-09 DIAGNOSIS — D1801 Hemangioma of skin and subcutaneous tissue: Secondary | ICD-10-CM | POA: Diagnosis not present

## 2023-05-09 DIAGNOSIS — L910 Hypertrophic scar: Secondary | ICD-10-CM | POA: Diagnosis not present

## 2023-05-09 DIAGNOSIS — L821 Other seborrheic keratosis: Secondary | ICD-10-CM | POA: Diagnosis not present

## 2023-05-09 DIAGNOSIS — L209 Atopic dermatitis, unspecified: Secondary | ICD-10-CM | POA: Diagnosis not present

## 2023-05-09 DIAGNOSIS — L720 Epidermal cyst: Secondary | ICD-10-CM | POA: Diagnosis not present

## 2023-05-09 DIAGNOSIS — L814 Other melanin hyperpigmentation: Secondary | ICD-10-CM | POA: Diagnosis not present

## 2023-05-09 DIAGNOSIS — L2081 Atopic neurodermatitis: Secondary | ICD-10-CM

## 2023-05-09 DIAGNOSIS — Z1283 Encounter for screening for malignant neoplasm of skin: Secondary | ICD-10-CM | POA: Diagnosis not present

## 2023-05-09 DIAGNOSIS — L820 Inflamed seborrheic keratosis: Secondary | ICD-10-CM

## 2023-05-09 DIAGNOSIS — W908XXA Exposure to other nonionizing radiation, initial encounter: Secondary | ICD-10-CM

## 2023-05-09 DIAGNOSIS — L988 Other specified disorders of the skin and subcutaneous tissue: Secondary | ICD-10-CM | POA: Diagnosis not present

## 2023-05-09 DIAGNOSIS — L578 Other skin changes due to chronic exposure to nonionizing radiation: Secondary | ICD-10-CM

## 2023-05-09 DIAGNOSIS — L219 Seborrheic dermatitis, unspecified: Secondary | ICD-10-CM | POA: Diagnosis not present

## 2023-05-09 DIAGNOSIS — D229 Melanocytic nevi, unspecified: Secondary | ICD-10-CM

## 2023-05-09 DIAGNOSIS — Z85828 Personal history of other malignant neoplasm of skin: Secondary | ICD-10-CM

## 2023-05-09 MED ORDER — TRETINOIN 0.05 % EX CREA: TOPICAL_CREAM | CUTANEOUS | 11 refills | Status: AC

## 2023-05-09 MED ORDER — TRIAMCINOLONE ACETONIDE 10 MG/ML IJ SUSP
10.0000 mg | Freq: Once | INTRAMUSCULAR | Status: AC
  Administered 2023-05-09: 10 mg via INTRADERMAL

## 2023-05-09 NOTE — Progress Notes (Signed)
 Follow-Up Visit   Subjective  Andrea Savage is a 78 y.o. female who presents for the following: Skin Cancer Screening and Full Body Skin Exam. Hx of SCC  The patient presents for Total-Body Skin Exam (TBSE) for skin cancer screening and mole check. The patient has spots, moles and lesions to be evaluated, some may be new or changing and the patient may have concern these could be cancer. She has multiple spots that are itchy and that she picks at.    The following portions of the chart were reviewed this encounter and updated as appropriate: medications, allergies, medical history  Review of Systems:  No other skin or systemic complaints except as noted in HPI or Assessment and Plan.  Objective  Well appearing patient in no apparent distress; mood and affect are within normal limits.  A full examination was performed including scalp, head, eyes, ears, nose, lips, neck, chest, axillae, abdomen, back, buttocks, bilateral upper extremities, bilateral lower extremities, hands, feet, fingers, toes, fingernails, and toenails. All findings within normal limits unless otherwise noted below.   Relevant physical exam findings are noted in the Assessment and Plan.  Back x10, R lower leg x1, L posterior thigh x5, R medial breast x1 (17) Erythematous keratotic or waxy stuck-on papule   Assessment & Plan   SKIN CANCER SCREENING PERFORMED TODAY.  HISTORY OF SQUAMOUS CELL CARCINOMA OF THE SKIN. Right spinal mid lower back. EDC 12/15/2021. - No evidence of recurrence today - Recommend regular full body skin exams - Recommend daily broad spectrum sunscreen SPF 30+ to sun-exposed areas, reapply every 2 hours as needed.  - Call if any new or changing lesions are noted between office visits   ACTINIC DAMAGE - Chronic condition, secondary to cumulative UV/sun exposure - diffuse scaly erythematous macules with underlying dyspigmentation - Recommend daily broad spectrum sunscreen SPF 30+ to  sun-exposed areas, reapply every 2 hours as needed.  - Staying in the shade or wearing long sleeves, sun glasses (UVA+UVB protection) and wide brim hats (4-inch brim around the entire circumference of the hat) are also recommended for sun protection.  - Call for new or changing lesions.  LENTIGINES, SEBORRHEIC KERATOSES, HEMANGIOMAS - Benign normal skin lesions - Benign-appearing - Call for any changes  MELANOCYTIC NEVI - Tan-brown and/or pink-flesh-colored symmetric macules and papules - Benign appearing on exam today - Observation - Call clinic for new or changing moles - Recommend daily use of broad spectrum spf 30+ sunscreen to sun-exposed areas.    ATOPIC DERMATITIS Exam: Xerosis with scattered excoriations at back and abdomen. <1% BSA  Chronic and persistent condition with duration or expected duration over one year. Condition is improving with treatment but not currently at goal.   Atopic dermatitis (eczema) is a chronic, relapsing, pruritic condition that can significantly affect quality of life. It is often associated with allergic rhinitis and/or asthma and can require treatment with topical medications, phototherapy, or in severe cases biologic injectable medication (Dupixent; Adbry) or Oral JAK inhibitors.  Treatment Plan: Continue clobetasol/CeraVe mix twice daily as needed for flares/itch. Avoid applying to face, groin, and axilla. Use as directed. Long-term use can cause thinning of the skin.  Patient has. Patient will call for refills   Recommend mild soap and moisturizing cream 1-2 times daily.  Gentle skin care handout provided.  Recommend daily CeraVe anti itch moisturizer.   Recommend gentle skin care.  SEBORRHEIC DERMATITIS Exam: mild erythema at scalp, pt c/o itching off and on  Chronic and persistent  condition with duration or expected duration over one year. Condition is improving with treatment but not currently at goal.   Seborrheic Dermatitis is a  chronic persistent rash characterized by pinkness and scaling most commonly of the mid face but also can occur on the scalp (dandruff), ears; mid chest, mid back and groin.  It tends to be exacerbated by stress and cooler weather.  People who have neurologic disease may experience new onset or exacerbation of existing seborrheic dermatitis.  The condition is not curable but treatable and can be controlled.  Treatment Plan:  Continue Clobetasol shampoo weekly apply to dry scalp, let sit 20 minutes and wash out  Cont Clobetasol sol 1-2gtts to aa scalp qd/bid prn itch  Patient will call for refills   Topical steroids (such as triamcinolone, fluocinolone, fluocinonide, mometasone, clobetasol, halobetasol, betamethasone, hydrocortisone) can cause thinning and lightening of the skin if they are used for too long in the same area. Your physician has selected the right strength medicine for your problem and area affected on the body. Please use your medication only as directed by your physician to prevent side effects.       Milia - tiny firm smooth white papules at face, lips and vermilion border. - type of cyst - benign - sometimes these will clear with nightly OTC adapalene/Differin 0.1% gel or retinol. - may be extracted if symptomatic - observe -Start tretinoin 0.05% cream qhs face as tolerated   Topical retinoid medications like tretinoin/Retin-A, adapalene/Differin, tazarotene/Fabior, and Epiduo/Epiduo Forte can cause dryness and irritation when first started. Only apply a pea-sized amount to the entire affected area. Avoid applying it around the eyes, edges of mouth and creases at the nose. If you experience irritation, use a good moisturizer first and/or apply the medicine less often. If you are doing well with the medicine, you can increase how often you use it until you are applying every night. Be careful with sun protection while using this medication as it can make you sensitive to the  sun. This medicine should not be used by pregnant women.    HYPERTROPHIC SCAR (at [facility] site) Exam: indurated violaceous smooth white plaque, itches   Treatment Plan:  Benign-appearing.  Symptomatic, irritating, patient would like treated.  Call clinic for new or changing lesions.  Treat with intralesional triamcinolone.   Procedure Note Intralesional Injection Informed Consent: Discussed risks (infection, pain, bleeding, bruising, thinning of the skin, loss of skin pigment, lack of resolution, and recurrence of lesion) and benefits of the procedure, as well as the alternatives. Informed consent was obtained. Preparation: The area was prepared a standard fashion. Anesthesia: none Procedure Details: An intralesional injection was performed with Kenalog 10 mg/cc. 0.1 cc in total were injected. NDC #: 5621-3086-57 Exp: 06/2025 Lot: [id number] Location: Right spinal mid lower back Total number of injections: 2 The patient was instructed on post-op care. Recommend OTC analgesia as needed for pain.   INFLAMED SEBORRHEIC KERATOSIS (17) Back x10, R lower leg x1, L posterior thigh x5, R medial breast x1 (17) Symptomatic, irritating, patient would like treated. Destruction of lesion - Back x10, R lower leg x1, L posterior thigh x5, R medial breast x1 (17)  Destruction method: cryotherapy   Informed consent: discussed and consent obtained   Lesion destroyed using liquid nitrogen: Yes   Region frozen until ice ball extended beyond lesion: Yes   Outcome: patient tolerated procedure well with no complications   Post-procedure details: wound care instructions given   Additional details:  Prior to procedure, discussed risks of blister formation, small wound, skin dyspigmentation, or rare scar following cryotherapy. Recommend Vaseline ointment to treated areas while healing.  ELASTOSIS OF SKIN   HYPERTROPHIC SCAR   Related Medications triamcinolone acetonide (KENALOG) 10 MG/ML injection 10  mg  Return in about 1 year (around 05/08/2024) for TBSE, HxSCC.  I, Lawson Radar, CMA, am acting as scribe for Willeen Niece, MD.   Documentation: I have reviewed the above documentation for accuracy and completeness, and I agree with the above.  Willeen Niece, MD

## 2023-05-09 NOTE — Patient Instructions (Addendum)
 Cryotherapy Aftercare  Wash gently with soap and water everyday.   Apply VaselineJelly daily until healed.     Recommend daily broad spectrum sunscreen SPF 30+ to sun-exposed areas, reapply every 2 hours as needed. Call for new or changing lesions.  Staying in the shade or wearing long sleeves, sun glasses (UVA+UVB protection) and wide brim hats (4-inch brim around the entire circumference of the hat) are also recommended for sun protection.     For rough dry skin: Recommend starting moisturizer with exfoliant (Urea, Salicylic acid, or Lactic acid) one to two times daily to help smooth rough and bumpy skin.  OTC options include Cetaphil Rough and Bumpy lotion (Urea), Eucerin Roughness Relief lotion or spot treatment cream (Urea), CeraVe SA lotion/cream for Rough and Bumpy skin (Sal Acid), Gold Bond Rough and Bumpy cream (Sal Acid), and AmLactin 12% lotion/cream (Lactic Acid).  If applying in morning, also apply sunscreen to sun-exposed areas, since these exfoliating moisturizers can increase sensitivity to sun.     Melanoma ABCDEs  Melanoma is the most dangerous type of skin cancer, and is the leading cause of death from skin disease.  You are more likely to develop melanoma if you: Have light-colored skin, light-colored eyes, or red or blond hair Spend a lot of time in the sun Tan regularly, either outdoors or in a tanning bed Have had blistering sunburns, especially during childhood Have a close family member who has had a melanoma Have atypical moles or large birthmarks  Early detection of melanoma is key since treatment is typically straightforward and cure rates are extremely high if we catch it early.   The first sign of melanoma is often a change in a mole or a new dark spot.  The ABCDE system is a way of remembering the signs of melanoma.  A for asymmetry:  The two halves do not match. B for border:  The edges of the growth are irregular. C for color:  A mixture of colors  are present instead of an even brown color. D for diameter:  Melanomas are usually (but not always) greater than 6mm - the size of a pencil eraser. E for evolution:  The spot keeps changing in size, shape, and color.  Please check your skin once per month between visits. You can use a small mirror in front and a large mirror behind you to keep an eye on the back side or your body.   If you see any new or changing lesions before your next follow-up, please call to schedule a visit.  Please continue daily skin protection including broad spectrum sunscreen SPF 30+ to sun-exposed areas, reapplying every 2 hours as needed when you're outdoors.   Staying in the shade or wearing long sleeves, sun glasses (UVA+UVB protection) and wide brim hats (4-inch brim around the entire circumference of the hat) are also recommended for sun protection.      Due to recent changes in healthcare laws, you may see results of your pathology and/or laboratory studies on MyChart before the doctors have had a chance to review them. We understand that in some cases there may be results that are confusing or concerning to you. Please understand that not all results are received at the same time and often the doctors may need to interpret multiple results in order to provide you with the best plan of care or course of treatment. Therefore, we ask that you please give [place] 2 business days to thoroughly review all your results before  contacting the office for clarification. Should we see a critical lab result, you will be contacted sooner.   If You Need Anything After Your Visit  If you have any questions or concerns for your doctor, please call our main line at [phone] and press option 4 to reach your doctor's medical assistant. If no one answers, please leave a voicemail as directed and we will return your call as soon as possible. Messages left after 4 pm will be answered the following business day.   You may also send [place]  a message via MyChart. We typically respond to MyChart messages within 1-2 business days.  For prescription refills, please ask your pharmacy to contact our office. Our fax number is [phone].  If you have an urgent issue when the clinic is closed that cannot wait until the next business day, you can page your doctor at the number below.    Please note that while we do our best to be available for urgent issues outside of office hours, we are not available 24/7.   If you have an urgent issue and are unable to reach [place], you may choose to seek medical care at your doctor's office, retail clinic, urgent care center, or emergency room.  If you have a medical emergency, please immediately call 911 or go to the emergency department.  Pager Numbers  - Dr. Gwen Pounds: [phone]  - Dr. Roseanne Reno: [phone]  - Dr. Katrinka Blazing: [phone]   In the event of inclement weather, please call our main line at [phone] for an update on the status of any delays or closures.  Dermatology Medication Tips: Please keep the boxes that topical medications come in in order to help keep track of the instructions about where and how to use these. Pharmacies typically print the medication instructions only on the boxes and not directly on the medication tubes.   If your medication is too expensive, please contact our office at [phone] option 4 or send [place] a message through MyChart.   We are unable to tell what your co-pay for medications will be in advance as this is different depending on your insurance coverage. However, we may be able to find a substitute medication at lower cost or fill out paperwork to get insurance to cover a needed medication.   If a prior authorization is required to get your medication covered by your insurance company, please allow [place] 1-2 business days to complete this process.  Drug prices often vary depending on where the prescription is filled and some pharmacies may offer  cheaper prices.  The website www.goodrx.com contains coupons for medications through different pharmacies. The prices here do not account for what the cost may be with help from insurance (it may be cheaper with your insurance), but the website can give you the price if you did not use any insurance.  - You can print the associated coupon and take it with your prescription to the pharmacy.  - You may also stop by our office during regular business hours and pick up a GoodRx coupon card.  - If you need your prescription sent electronically to a different pharmacy, notify our office through [facility] or by phone at [phone] option 4.     Si Usted Necesita Algo Despus de Su Visita  Tambin puede enviarnos un mensaje a travs de Clinical cytogeneticist. Por lo general respondemos a los mensajes de MyChart en el transcurso de 1 a 2 das hbiles.  Para renovar recetas, por favor pida  a su farmacia que se ponga en contacto con nuestra oficina. Annie Sable de fax es [place] [phone].  Si tiene un asunto urgente cuando la clnica est cerrada y que no puede esperar hasta el siguiente da hbil, puede llamar/localizar a su doctor(a) al nmero que aparece a continuacin.   Por favor, tenga en cuenta que aunque hacemos todo lo posible para estar disponibles para asuntos urgentes fuera del horario de [place], no estamos disponibles las 24 horas del da, los 7 [address]  [address] de la [place].   Si tiene un problema urgente y no puede comunicarse con nosotros, puede optar por buscar atencin mdica  en el consultorio de su doctor(a), en una clnica privada, en un centro de atencin urgente o en una sala de emergencias.  Si tiene Engineer, drilling, por favor llame inmediatamente al 911 o vaya a la sala de emergencias.  Nmeros de bper  - Dr. Gwen Pounds: [phone]  - Dra. Roseanne Reno: [phone]  - Dr. Katrinka Blazing: [phone]   En caso de inclemencias del tiempo, por favor llame a Lacy Duverney principal al  [phone] para una actualizacin sobre el [place] de cualquier retraso o cierre.  Consejos para la medicacin en dermatologa: Por favor, guarde las cajas en las que vienen los medicamentos de uso tpico para ayudarle a seguir las instrucciones sobre dnde y cmo usarlos. Las farmacias generalmente imprimen las instrucciones del medicamento slo en las cajas y no directamente en los tubos del [place].   Si su medicamento es muy caro, por favor, pngase en contacto con Rolm Gala llamando al [phone] y presione la opcin 4 o envenos un mensaje a travs de Clinical cytogeneticist.   No podemos decirle cul ser su copago por los medicamentos por adelantado ya que esto es diferente dependiendo de la cobertura de su seguro. Sin embargo, es posible que podamos encontrar un medicamento sustituto a Audiological scientist un formulario para que el seguro cubra el medicamento que se considera necesario.   Si se requiere una autorizacin previa para que su compaa de seguros [place] su medicamento, por favor permtanos de 1 a 2 das hbiles para completar [address]  Los precios de los medicamentos varan con frecuencia dependiendo del Environmental consultant de dnde se surte la receta y alguna farmacias pueden ofrecer precios ms baratos.  El sitio web www.goodrx.com tiene cupones para medicamentos de Health and safety inspector. Los precios aqu no tienen en cuenta lo que podra costar con la ayuda del seguro (puede ser ms barato con su seguro), pero el sitio web puede darle el precio si no utiliz Tourist information centre manager.  - Puede imprimir el cupn correspondiente y llevarlo con su receta a la farmacia.  - Tambin puede pasar por nuestra oficina durante el horario de atencin regular y Education officer, museum una tarjeta de cupones de GoodRx.  - Si necesita que su receta se enve electrnicamente a una farmacia diferente, informe a nuestra oficina a travs de MyChart de [place] o por telfono llamando al [phone] y presione la opcin 4.

## 2023-05-10 ENCOUNTER — Ambulatory Visit: Payer: PPO | Attending: Student

## 2023-05-10 DIAGNOSIS — M1712 Unilateral primary osteoarthritis, left knee: Secondary | ICD-10-CM | POA: Diagnosis not present

## 2023-05-10 DIAGNOSIS — M25562 Pain in left knee: Secondary | ICD-10-CM | POA: Insufficient documentation

## 2023-05-10 DIAGNOSIS — M6281 Muscle weakness (generalized): Secondary | ICD-10-CM | POA: Insufficient documentation

## 2023-05-10 DIAGNOSIS — R262 Difficulty in walking, not elsewhere classified: Secondary | ICD-10-CM | POA: Diagnosis not present
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
## 2023-05-12 DIAGNOSIS — Z96651 Presence of right artificial knee joint: Secondary | ICD-10-CM | POA: Diagnosis not present

## 2023-05-12 DIAGNOSIS — J208 Acute bronchitis due to other specified organisms: Secondary | ICD-10-CM | POA: Diagnosis not present

## 2023-05-12 DIAGNOSIS — M76899 Other specified enthesopathies of unspecified lower limb, excluding foot: Secondary | ICD-10-CM | POA: Diagnosis not present

## 2023-05-12 DIAGNOSIS — M25561 Pain in right knee: Secondary | ICD-10-CM | POA: Diagnosis not present

## 2023-05-15 ENCOUNTER — Ambulatory Visit: Payer: PPO

## 2023-05-15 DIAGNOSIS — M6281 Muscle weakness (generalized): Secondary | ICD-10-CM

## 2023-05-15 DIAGNOSIS — M1712 Unilateral primary osteoarthritis, left knee: Secondary | ICD-10-CM

## 2023-05-15 DIAGNOSIS — M25562 Pain in left knee: Secondary | ICD-10-CM

## 2023-05-15 DIAGNOSIS — R262 Difficulty in walking, not elsewhere classified: Secondary | ICD-10-CM
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
## 2023-05-17 ENCOUNTER — Ambulatory Visit: Payer: PPO

## 2023-05-19 DIAGNOSIS — E538 Deficiency of other specified B group vitamins: Secondary | ICD-10-CM | POA: Diagnosis not present

## 2023-05-19 DIAGNOSIS — S76012D Strain of muscle, fascia and tendon of left hip, subsequent encounter: Secondary | ICD-10-CM | POA: Diagnosis not present

## 2023-05-19 DIAGNOSIS — M7062 Trochanteric bursitis, left hip: Secondary | ICD-10-CM | POA: Diagnosis not present

## 2023-05-22 DIAGNOSIS — R932 Abnormal findings on diagnostic imaging of liver and biliary tract: Secondary | ICD-10-CM | POA: Diagnosis not present

## 2023-05-22 DIAGNOSIS — R053 Chronic cough: Secondary | ICD-10-CM | POA: Diagnosis not present

## 2023-05-22 DIAGNOSIS — K769 Liver disease, unspecified: Secondary | ICD-10-CM | POA: Diagnosis not present

## 2023-06-15 DIAGNOSIS — R053 Chronic cough: Secondary | ICD-10-CM | POA: Diagnosis not present

## 2023-06-15 DIAGNOSIS — R1310 Dysphagia, unspecified: Secondary | ICD-10-CM | POA: Diagnosis not present

## 2023-06-23 ENCOUNTER — Telehealth: Payer: Self-pay | Admitting: Student

## 2023-06-23 ENCOUNTER — Other Ambulatory Visit: Payer: Self-pay

## 2023-06-23 DIAGNOSIS — I471 Supraventricular tachycardia, unspecified: Secondary | ICD-10-CM

## 2023-06-23 MED ORDER — METOPROLOL SUCCINATE ER 25 MG PO TB24: 37.5000 mg | ORAL_TABLET | Freq: Every day | ORAL | 3 refills | Status: DC

## 2023-06-23 NOTE — Telephone Encounter (Signed)
 Called patient, at last visit in December it was mentioned below:  14 day Zio August 2019 showed 33 atrial runs. She denies sustained palpitations. She will occasionally feel her heart racing but this is typically brief lasting a few seconds and resolving on its own. Palpitations are occasionally triggered by chronic cough. She is instructed to take an extra 1/2 of Toprol  if she has sustained palpitations. EKG is NSR. No ectopy on exam today.  -Continue Toprol .    She states she thought she was supposed to take 1.5 tablet of Metoprolol  daily- this is what she has been doing- however, the prescription has run out and they need it to be updated. She states her blood pressure and HR have been normal, and no palpitations, she has felt good.   Advised I would route to NP to advise further and if okay to updated RX.  Thanks!

## 2023-06-23 NOTE — Telephone Encounter (Signed)
 Pt c/o medication issue:  1. Name of Medication:   metoprolol  succinate (TOPROL -XL) 25 MG 24 hr tablet    2. How are you currently taking this medication (dosage and times per day)? 1.5 daily   3. Are you having a reaction (difficulty breathing--STAT)? No   4. What is your medication issue? Pt states this medication was increased to 1.5 tablets daily and the pharmacy is saying it is too early to fill. Please advise.

## 2023-07-11 DIAGNOSIS — H524 Presbyopia: Secondary | ICD-10-CM | POA: Diagnosis not present

## 2023-07-11 DIAGNOSIS — H43811 Vitreous degeneration, right eye: Secondary | ICD-10-CM | POA: Diagnosis not present

## 2023-07-11 DIAGNOSIS — H11823 Conjunctivochalasis, bilateral: Secondary | ICD-10-CM | POA: Diagnosis not present

## 2023-07-11 DIAGNOSIS — Z961 Presence of intraocular lens: Secondary | ICD-10-CM | POA: Diagnosis not present

## 2023-07-11 DIAGNOSIS — H02413 Mechanical ptosis of bilateral eyelids: Secondary | ICD-10-CM | POA: Diagnosis not present

## 2023-07-13 DIAGNOSIS — M7062 Trochanteric bursitis, left hip: Secondary | ICD-10-CM | POA: Diagnosis not present

## 2023-07-13 DIAGNOSIS — Z96651 Presence of right artificial knee joint: Secondary | ICD-10-CM | POA: Diagnosis not present

## 2023-07-13 DIAGNOSIS — M7051 Other bursitis of knee, right knee: Secondary | ICD-10-CM | POA: Diagnosis not present

## 2023-07-13 DIAGNOSIS — M7052 Other bursitis of knee, left knee: Secondary | ICD-10-CM | POA: Diagnosis not present

## 2023-07-24 ENCOUNTER — Ambulatory Visit: Attending: Orthopedic Surgery

## 2023-07-24 DIAGNOSIS — M1712 Unilateral primary osteoarthritis, left knee: Secondary | ICD-10-CM

## 2023-07-24 DIAGNOSIS — G8929 Other chronic pain: Secondary | ICD-10-CM

## 2023-07-24 DIAGNOSIS — R262 Difficulty in walking, not elsewhere classified: Secondary | ICD-10-CM | POA: Diagnosis not present

## 2023-07-24 DIAGNOSIS — M6281 Muscle weakness (generalized): Secondary | ICD-10-CM | POA: Diagnosis not present

## 2023-07-24 DIAGNOSIS — M25561 Pain in right knee: Secondary | ICD-10-CM | POA: Insufficient documentation

## 2023-07-24 DIAGNOSIS — M7062 Trochanteric bursitis, left hip: Secondary | ICD-10-CM

## 2023-07-24 NOTE — Therapy (Signed)
 OUTPATIENT PHYSICAL THERAPY LOWER EXTREMITY EVALUATION 07/24/23   Patient Name: Andrea Savage MRN: [id number] DOB:Feb 27, 1946, 78 y.o., female Today's Date: 07/26/2023  END OF SESSION:  PT End of Session - 07/26/23 1340     Visit Number 1    Number of Visits 13    Date for PT Re-Evaluation 09/06/23    Authorization Type Medicare    Authorization Time Period 2x/week x 6 weeks ( recert needed at 7/2), PN needed at visit #10    PT Start Time 1415    PT Stop Time 1500    PT Time Calculation (min) 45 min    Activity Tolerance Patient tolerated treatment well    Behavior During Therapy WFL for tasks assessed/performed             Past Medical History:  Diagnosis Date   (HFpEF) heart failure with preserved ejection fraction (HCC)    Anxiety    Asthma    Depression    Fibromyalgia    GERD (gastroesophageal reflux disease)    Grade II diastolic dysfunction    H/O scarlet fever    as child   Heart murmur    s/p scarlet fever as child   HLD (hyperlipidemia)    HTN (hypertension)    IBS (irritable bowel syndrome)    Low bone density    Mitral regurgitation    OA (osteoarthritis)    Osteopenia    Palpitations    PHN (postherpetic neuralgia)    PSVT (paroxysmal supraventricular tachycardia) (HCC)    Recurrent syncope    Rosacea    Seizure (HCC) 1978   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   Squamous cell carcinoma of skin 12/15/2021   R spinal mid lower back, EDC   Wears hearing aid in both ears    Past Surgical History:  Procedure Laterality Date   ABDOMINAL HYSTERECTOMY  2009   Total   BREAST EXCISIONAL BIOPSY Left 1979   NEG   COLONOSCOPY  [id number]   repeat 10 years   COLONOSCOPY WITH PROPOFOL  N/A 03/24/2020   Procedure: COLONOSCOPY WITH PROPOFOL ;  Surgeon: Shane Darling, MD;  Location: [facility];  Service: Endoscopy;  Laterality: N/A;   ESOPHAGOGASTRODUODENOSCOPY (EGD) WITH PROPOFOL  N/A 03/24/2020   Procedure: ESOPHAGOGASTRODUODENOSCOPY (EGD) WITH  PROPOFOL ;  Surgeon: Shane Darling, MD;  Location: [facility];  Service: Endoscopy;  Laterality: N/A;   EXCISION MORTON'S NEUROMA Left 09/21/2017   Procedure: EXCISION MORTON'S NEUROMA;  Surgeon: Sharlyn Deaner, DPM;  Location: [facility];  Service: Podiatry;  Laterality: Left;   FOOT SURGERY Right 1990   HAND SURGERY Right    KNEE ARTHROPLASTY Right 12/14/2020   Procedure: COMPUTER ASSISTED TOTAL KNEE ARTHROPLASTY;  Surgeon: Arlyne Lame, MD;  Location: [facility];  Service: Orthopedics;  Laterality: Right;   KNEE ARTHROSCOPY Right 10/14/2020   Procedure: ARTHROSCOPY KNEE, LATERAL MENISECTOMY;  Surgeon: Arlyne Lame, MD;  Location: [facility];  Service: Orthopedics;  Laterality: Right;   LAPAROSCOPIC OOPHERECTOMY     MINOR HARDWARE REMOVAL Left 11/22/2018   Procedure: REMOVAL SCREW;DEEP 2nd and 3RD LEFT CAPSULE RELEASE OF THIRD METATARSAL PHALANGEAL JOINT LEFT, TENDON LENGTHING THIRD TOE LEFT AND CORTISONE INJECTION LEFT;  Surgeon: Sharlyn Deaner, DPM;  Location: [facility];  Service: Podiatry;  Laterality: Left;  LMA OR IVA LOCAL   TONSILLECTOMY     TONSILLECTOMY     WEIL OSTEOTOMY Left 09/21/2017   Procedure: WEIL OSTEOTOMY-2ND & 3RD TOES;  Surgeon: Sharlyn Deaner, DPM;  Location: [facility];  Service: Podiatry;  Laterality: Left;  LMA WITH LOCAL MINI MONSTER DR Ulanda Gambles WANTS TED HOSE ON PATIENT'S RIGHT LEG   Patient Active Problem List   Diagnosis Date Noted   Nonrheumatic mitral valve regurgitation 12/18/2021   Essential hypertension 12/18/2021   Total knee replacement status 12/14/2020   Claudication in peripheral vascular disease (HCC) 10/04/2020   Chronic heart failure with preserved ejection fraction (HFpEF) (HCC) 12/17/2019   Chronic cough 10/15/2019   Bronchitis 10/15/2019   Hyperlipidemia LDL goal <100 07/22/2019   Paroxysmal SVT (supraventricular tachycardia) (HCC) 06/13/2018   Near syncope 09/11/2017   Asthma 03/23/2017    Fibromyalgia 03/10/2016   Sensorineural hearing loss (SNHL) of left ear with restricted hearing of right ear 12/29/2015   Post herpetic neuralgia 11/12/2015   Gastro-esophageal reflux disease without esophagitis 09/21/2015   Depression 06/29/2015   Major depressive disorder, single episode 06/29/2015   Osteopenia    Reflux    Rosacea     PCP: Dr. Primus Brookes, MD  REFERRING PROVIDER: Dr. Aubry Blase, MD  REFERRING DIAG:  [phone] (ICD-10-CM) - Presence of right artificial knee joint  M70.51 (ICD-10-CM) - Other bursitis of knee, right knee  M70.52 (ICD-10-CM) - Other bursitis of knee, left knee  M70.62 (ICD-10-CM) - Trochanteric bursitis, left hip    THERAPY DIAG:  Primary osteoarthritis of left knee  Muscle weakness (generalized)  Difficulty in walking, not elsewhere classified  Chronic pain of right knee  Greater trochanteric bursitis of left hip  Rationale for Evaluation and Treatment: Rehabilitation  ONSET DATE: chronic  SUBJECTIVE:   SUBJECTIVE STATEMENT: Pt recently returned to [place] after a trip to [place].  She used the trekking pole while traveling in [place] x 2 weeks.  She was very active while traveling- walking up steep inclines/castles/forts/longer distances.  Did a LOT of walking, a lot of steps, walking on cobblestone streets, most days >10,000 steps.     C/o: R knee pain worsened when she returned to [place]; and L knee pain worsened too, not as significantly as R.  Has had a flare up of her fibromyalgia.  She is also dealing with a chronic cough.    She saw Dr. Hooten when she returned back to [place] because she was concerned about the pain in her R knee (which is her knee that has TKA).  She reports having imaging of R knee which showed her TKA is stable.   Overall her sx are slowly starting to improve since she saw Dr. Hooten a few weeks ago.  PERTINENT HISTORY: Had a course of PT that ended in March 2025 Saw Orthopedics before [place]- she had an injection in L hip gr  trochanter- this is doing well She also had L knee injection before her previous course of PT and responded well to that tx  PAIN:  Are you having pain? Yes: 5/10 currently; at worst 9/10, 2/10 best   PRECAUTIONS: none  RED FLAGS: None   WEIGHT BEARING RESTRICTIONS: No  FALLS:  Has patient fallen in last 6 months? No  LIVING ENVIRONMENT: Lives with: lives with their family Lives in: House/apartment Stairs: 1 flight inside (bedroom is on first floor) Has following equipment at home: trekking poles  OCCUPATION: retired  PLOF: Independent  PATIENT GOALS: to improve her knee/LE pain, to be able to navigate the stairs in her home comfortably and also the stairs at her family's home when she travels to visit them in a few weeks  NEXT MD VISIT: none reported  OBJECTIVE:  Note: Objective  measures were completed at Evaluation unless otherwise noted.  DIAGNOSTIC FINDINGS: x-rays (see chart)  PATIENT SURVEYS:  LEFS  Extreme difficulty/unable (0), Quite a bit of difficulty (1), Moderate difficulty (2), Little difficulty (3), No difficulty (4) Survey date:  07/24/23  Any of your usual work, housework or school activities 1  2. Usual hobbies, recreational or sporting activities 1  3. Getting into/out of the bath 3  4. Walking between rooms 3  5. Putting on socks/shoes 3  6. Squatting  0  7. Lifting an object, like a bag of groceries from the floor 2  8. Performing light activities around your home 0  9. Performing heavy activities around your home 2  10. Getting into/out of a car 4  11. Walking 2 blocks 0  12. Walking 1 mile 0  13. Going up/down 10 stairs (1 flight) 0  14. Standing for 1 hour 2  15.  sitting for 1 hour 2  16. Running on even ground 0  17. Running on uneven ground 0  18. Making sharp turns while running fast 0  19. Hopping  0  20. Rolling over in bed 2  Score total:  25/80     COGNITION: Overall cognitive status: Within functional limits for tasks  assessed     SENSATION: WFL  MUSCLE LENGTH: Normal Hamstring mobility b/l with SLR  PALPATION: Pt reports significant tenderness with palpation along R and L pes anserine, L distal ITB, b/l quadriceps tendon,   LOWER EXTREMITY ROM:  Active ROM Right eval Left eval  Hip flexion    Hip extension    Hip abduction    Hip adduction    Hip internal rotation    Hip external rotation    Knee flexion 130 135  Knee extension 0 0  Ankle dorsiflexion    Ankle plantarflexion    Ankle inversion    Ankle eversion     (Blank rows = not tested)  LOWER EXTREMITY MMT:  MMT Right eval Left eval  Hip flexion 4- 4-  Hip extension 4- 4-  Hip abduction 4 4  Hip adduction    Hip internal rotation    Hip external rotation    Knee flexion 4 4  Knee extension 4 4  Ankle dorsiflexion    Ankle plantarflexion    Ankle inversion    Ankle eversion     (Blank rows = not tested)   FUNCTIONAL TESTS:  5 times sit to stand: to be administered at next visit Single leg balance: 5 seconds R/L  GAIT: Pt amb without any AD in clinic today; decreased knee flexion noted during swing phase b/l, (+) antalgic gait R LE.                                                                                                                               TREATMENT DATE: 07/24/23  Initial evaluation HEP instruction Pt education  PATIENT EDUCATION:  Education details:  Discussed PT POC/goals, importance of activity pacing with her daily tasks for sx management right now as sx are mod/major irritability stage right now (5-9/10 pain and takes hours to improve); HEP instruction- see below Person educated: Patient Education method: Explanation, Demonstration, Tactile cues, Verbal cues, and Handouts Education comprehension: verbalized understanding and returned demonstration  HOME EXERCISE PROGRAM: Access Code: [email or web address] URL: [email or web address] Date: 07/24/2023 Prepared by: Lucrecia Sables  Exercises - Seated Heel Slide  - 2 x daily - 7 x weekly - 2 sets - 10 reps - Seated Calf Stretch with Strap  - 2 x daily - 7 x weekly - 3 sets - 30 seconds hold  ASSESSMENT:  CLINICAL IMPRESSION: Patient is a 78 y.o. F who was seen today for physical therapy evaluation and treatment for acute flare up of b/l knee pain after traveling and significantly increasing the amount of walking, especially on uneven terrain while [place].  Her sx are currently mod/majorly irritable and significantly limiting her ability to perform house work, cooking, stair navigation, lifting/carrying, walking >2 blocks (unable per LEFS today), and standing >1 hr.  She is very motivated to participate in PT and should do well with a course of tx to facilitate return to PLOF; expect her to do well as she responded well and was very motivated and compliant with her previous course of PT.   OBJECTIVE IMPAIRMENTS: Abnormal gait, decreased activity tolerance, decreased balance, difficulty walking, decreased ROM, decreased strength, impaired perceived functional ability, and pain.   ACTIVITY LIMITATIONS: carrying, lifting, bending, sitting, standing, squatting, stairs, transfers, and locomotion level  PARTICIPATION LIMITATIONS: meal prep, cleaning, laundry, interpersonal relationship, driving, shopping, community activity, yard work, and church  PERSONAL FACTORS: Age, Past/current experiences, and Time since onset of injury/illness/exacerbation are also affecting patient's functional outcome.   REHAB POTENTIAL: Excellent  CLINICAL DECISION MAKING: Stable/uncomplicated  EVALUATION COMPLEXITY: Low   GOALS: Goals reviewed with patient? Yes  SHORT TERM GOALS: Target date: 6//25 Pt will demonstrate ability to perform HEP for LE strength with PT verbal/tactile cues needed <50% of the time Baseline: initiated HEP today Goal status: INITIAL    LONG TERM GOALS: Target date: 09/06/23  Improve LEFS >18 (9 MDC)  indicating pt is able to perform her daily activities including ascending/descending stairs without being as limited by b/l knee pain Baseline: 25/80 Goal status: INITIAL  2.  Improve LE strength >1/2 MMT grade b/l to facilitate improved ability to stand and walk >2 blocks to/from her church which she was able to do 6 months ago Baseline: unable to amb 2 blocks, 4-/5 hip flex, 4/5 knee ext Goal status: INITIAL  3.  Pt will be able to perform standing activities (cooking, laundry, cleaning) x 1 hr with <3/10 knee pain Baseline: 5-9/10 knee pain, limited to 10-15 min intervals Goal status: INITIAL   PLAN:  PT FREQUENCY: 2x/week  PT DURATION: 6 weeks  PLANNED INTERVENTIONS: 97110-Therapeutic exercises, 97530- Therapeutic activity, [id number]- Neuromuscular re-education, 97535- Self Care, and [id number]- Manual therapy  PLAN FOR NEXT SESSION: continue progressing HEP; LE strengthening  Lucrecia Sables, PT, DPT, OCS  #[id number]  Kin Penner, PT 07/26/2023, 2:16 PM

## 2023-07-26 ENCOUNTER — Ambulatory Visit: Attending: Orthopedic Surgery

## 2023-07-26 DIAGNOSIS — M25561 Pain in right knee: Secondary | ICD-10-CM | POA: Diagnosis not present

## 2023-07-26 DIAGNOSIS — M7062 Trochanteric bursitis, left hip: Secondary | ICD-10-CM | POA: Insufficient documentation

## 2023-07-26 DIAGNOSIS — R262 Difficulty in walking, not elsewhere classified: Secondary | ICD-10-CM | POA: Insufficient documentation

## 2023-07-26 DIAGNOSIS — M1712 Unilateral primary osteoarthritis, left knee: Secondary | ICD-10-CM | POA: Insufficient documentation

## 2023-07-26 DIAGNOSIS — M6281 Muscle weakness (generalized): Secondary | ICD-10-CM | POA: Insufficient documentation

## 2023-07-26 DIAGNOSIS — G8929 Other chronic pain: Secondary | ICD-10-CM | POA: Diagnosis not present

## 2023-07-26 NOTE — Therapy (Signed)
 OUTPATIENT PHYSICAL THERAPY LOWER EXTREMITY EVALUATION 07/26/23   Patient Name: Andrea Savage MRN: [id number] DOB:05-09-1945, 78 y.o., female Today's Date: 07/26/2023  END OF SESSION:  PT End of Session - 07/26/23 1607     Visit Number 2    Number of Visits 13    Date for PT Re-Evaluation 09/06/23    Authorization Type Medicare    Authorization Time Period 2x/week x 6 weeks ( recert needed at 7/2), PN needed at visit #10    PT Start Time 1415    PT Stop Time 1500    PT Time Calculation (min) 45 min    Activity Tolerance Patient tolerated treatment well    Behavior During Therapy WFL for tasks assessed/performed             Past Medical History:  Diagnosis Date   (HFpEF) heart failure with preserved ejection fraction (HCC)    Anxiety    Asthma    Depression    Fibromyalgia    GERD (gastroesophageal reflux disease)    Grade II diastolic dysfunction    H/O scarlet fever    as child   Heart murmur    s/p scarlet fever as child   HLD (hyperlipidemia)    HTN (hypertension)    IBS (irritable bowel syndrome)    Low bone density    Mitral regurgitation    OA (osteoarthritis)    Osteopenia    Palpitations    PHN (postherpetic neuralgia)    PSVT (paroxysmal supraventricular tachycardia) (HCC)    Recurrent syncope    Rosacea    Seizure (HCC) 1978   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   Squamous cell carcinoma of skin 12/15/2021   R spinal mid lower back, EDC   Wears hearing aid in both ears    Past Surgical History:  Procedure Laterality Date   ABDOMINAL HYSTERECTOMY  2009   Total   BREAST EXCISIONAL BIOPSY Left 1979   NEG   COLONOSCOPY  [id number]   repeat 10 years   COLONOSCOPY WITH PROPOFOL  N/A 03/24/2020   Procedure: COLONOSCOPY WITH PROPOFOL ;  Surgeon: Shane Darling, MD;  Location: [facility];  Service: Endoscopy;  Laterality: N/A;   ESOPHAGOGASTRODUODENOSCOPY (EGD) WITH PROPOFOL  N/A 03/24/2020   Procedure: ESOPHAGOGASTRODUODENOSCOPY (EGD) WITH  PROPOFOL ;  Surgeon: Shane Darling, MD;  Location: [facility];  Service: Endoscopy;  Laterality: N/A;   EXCISION MORTON'S NEUROMA Left 09/21/2017   Procedure: EXCISION MORTON'S NEUROMA;  Surgeon: Sharlyn Deaner, DPM;  Location: [facility];  Service: Podiatry;  Laterality: Left;   FOOT SURGERY Right 1990   HAND SURGERY Right    KNEE ARTHROPLASTY Right 12/14/2020   Procedure: COMPUTER ASSISTED TOTAL KNEE ARTHROPLASTY;  Surgeon: Arlyne Lame, MD;  Location: [facility];  Service: Orthopedics;  Laterality: Right;   KNEE ARTHROSCOPY Right 10/14/2020   Procedure: ARTHROSCOPY KNEE, LATERAL MENISECTOMY;  Surgeon: Arlyne Lame, MD;  Location: [facility];  Service: Orthopedics;  Laterality: Right;   LAPAROSCOPIC OOPHERECTOMY     MINOR HARDWARE REMOVAL Left 11/22/2018   Procedure: REMOVAL SCREW;DEEP 2nd and 3RD LEFT CAPSULE RELEASE OF THIRD METATARSAL PHALANGEAL JOINT LEFT, TENDON LENGTHING THIRD TOE LEFT AND CORTISONE INJECTION LEFT;  Surgeon: Sharlyn Deaner, DPM;  Location: [facility];  Service: Podiatry;  Laterality: Left;  LMA OR IVA LOCAL   TONSILLECTOMY     TONSILLECTOMY     WEIL OSTEOTOMY Left 09/21/2017   Procedure: WEIL OSTEOTOMY-2ND & 3RD TOES;  Surgeon: Sharlyn Deaner, DPM;  Location: [facility];  Service: Podiatry;  Laterality: Left;  LMA WITH LOCAL MINI MONSTER DR Ulanda Gambles WANTS TED HOSE ON PATIENT'S RIGHT LEG   Patient Active Problem List   Diagnosis Date Noted   Nonrheumatic mitral valve regurgitation 12/18/2021   Essential hypertension 12/18/2021   Total knee replacement status 12/14/2020   Claudication in peripheral vascular disease (HCC) 10/04/2020   Chronic heart failure with preserved ejection fraction (HFpEF) (HCC) 12/17/2019   Chronic cough 10/15/2019   Bronchitis 10/15/2019   Hyperlipidemia LDL goal <100 07/22/2019   Paroxysmal SVT (supraventricular tachycardia) (HCC) 06/13/2018   Near syncope 09/11/2017   Asthma 03/23/2017    Fibromyalgia 03/10/2016   Sensorineural hearing loss (SNHL) of left ear with restricted hearing of right ear 12/29/2015   Post herpetic neuralgia 11/12/2015   Gastro-esophageal reflux disease without esophagitis 09/21/2015   Depression 06/29/2015   Major depressive disorder, single episode 06/29/2015   Osteopenia    Reflux    Rosacea     PCP: Dr. Primus Brookes, MD  REFERRING PROVIDER: Dr. Aubry Blase, MD  REFERRING DIAG:  [phone] (ICD-10-CM) - Presence of right artificial knee joint  M70.51 (ICD-10-CM) - Other bursitis of knee, right knee  M70.52 (ICD-10-CM) - Other bursitis of knee, left knee  M70.62 (ICD-10-CM) - Trochanteric bursitis, left hip    THERAPY DIAG:  Primary osteoarthritis of left knee  Muscle weakness (generalized)  Difficulty in walking, not elsewhere classified  Chronic pain of right knee  Greater trochanteric bursitis of left hip  Rationale for Evaluation and Treatment: Rehabilitation  ONSET DATE: chronic  SUBJECTIVE:   SUBJECTIVE STATEMENT: Pt recently returned to [place] after a trip to [place].  She used the trekking pole while traveling in [place] x 2 weeks.  She was very active while traveling- walking up steep inclines/castles/forts/longer distances.  Did a LOT of walking, a lot of steps, walking on cobblestone streets, most days >10,000 steps.     C/o: R knee pain worsened when she returned to [place]; and L knee pain worsened too, not as significantly as R.  Has had a flare up of her fibromyalgia.  She is also dealing with a chronic cough.    She saw Dr. Hooten when she returned back to [facility] because she was concerned about the pain in her R knee (which is her knee that has TKA).  She reports having imaging of R knee which showed her TKA is stable.   Overall her sx are slowly starting to improve since she saw Dr. Hooten a few weeks ago.  PERTINENT HISTORY: Had a course of PT that ended in March 2025 Saw Orthopedics before [place]- she had an injection in L hip gr  trochanter- this is doing well She also had L knee injection before her previous course of PT and responded well to that tx  PAIN:  Are you having pain? Yes: 5/10 currently; at worst 9/10, 2/10 best   PRECAUTIONS: none  RED FLAGS: None   WEIGHT BEARING RESTRICTIONS: No  FALLS:  Has patient fallen in last 6 months? No  LIVING ENVIRONMENT: Lives with: lives with their family Lives in: House/apartment Stairs: 1 flight inside (bedroom is on first floor) Has following equipment at home: trekking poles  OCCUPATION: retired  PLOF: Independent  PATIENT GOALS: to improve her knee/LE pain, to be able to navigate the stairs in her home comfortably and also the stairs at her family's home when she travels to visit them in a few weeks  NEXT MD VISIT: none reported  OBJECTIVE:  Note: Objective  measures were completed at Evaluation unless otherwise noted.  DIAGNOSTIC FINDINGS: x-rays (see chart)  PATIENT SURVEYS:  LEFS  Extreme difficulty/unable (0), Quite a bit of difficulty (1), Moderate difficulty (2), Little difficulty (3), No difficulty (4) Survey date:  07/24/23  Any of your usual work, housework or school activities 1  2. Usual hobbies, recreational or sporting activities 1  3. Getting into/out of the bath 3  4. Walking between rooms 3  5. Putting on socks/shoes 3  6. Squatting  0  7. Lifting an object, like a bag of groceries from the floor 2  8. Performing light activities around your home 0  9. Performing heavy activities around your home 2  10. Getting into/out of a car 4  11. Walking 2 blocks 0  12. Walking 1 mile 0  13. Going up/down 10 stairs (1 flight) 0  14. Standing for 1 hour 2  15.  sitting for 1 hour 2  16. Running on even ground 0  17. Running on uneven ground 0  18. Making sharp turns while running fast 0  19. Hopping  0  20. Rolling over in bed 2  Score total:  25/80     COGNITION: Overall cognitive status: Within functional limits for tasks  assessed     SENSATION: WFL  MUSCLE LENGTH: Normal Hamstring mobility b/l with SLR  PALPATION: Pt reports significant tenderness with palpation along R and L pes anserine, L distal ITB, b/l quadriceps tendon,   LOWER EXTREMITY ROM:  Active ROM Right eval Left eval  Hip flexion    Hip extension    Hip abduction    Hip adduction    Hip internal rotation    Hip external rotation    Knee flexion 130 135  Knee extension 0 0  Ankle dorsiflexion    Ankle plantarflexion    Ankle inversion    Ankle eversion     (Blank rows = not tested)  LOWER EXTREMITY MMT:  MMT Right eval Left eval  Hip flexion 4- 4-  Hip extension 4- 4-  Hip abduction 4 4  Hip adduction    Hip internal rotation    Hip external rotation    Knee flexion 4 4  Knee extension 4 4  Ankle dorsiflexion    Ankle plantarflexion    Ankle inversion    Ankle eversion     (Blank rows = not tested)   FUNCTIONAL TESTS:  5 times sit to stand: to be administered at next visit Single leg balance: 5 seconds R/L  GAIT: Pt amb without any AD in clinic today; decreased knee flexion noted during swing phase b/l, (+) antalgic gait R LE.                                                                                                                               TREATMENT DATE: 07/26/23  Subjective: pt reports her legs are very sore upon arrival today.  Feels difficult to stand up from chair and painful with prolonged standing and walking.  Been on her feet a lot this week.  Pain 7-8/10  Objective: Therapeutic Exercises: Seated marches x10 ea LE Seated knee extension x15 ea LE Heel slides x 10 ea Gentle LE PROM with PT for sx management: hip flexion (SKTC), knee flexion/extension SAQ: x20 ea LE   Therapeutic Activities: Sit to stand x5, 2 sets Discussed strategies for activity pacing and modification, breaking tasks down into smaller components at home vs prolonged standing/walking for hours for sx  management  Discussed option for cold pack for pain control at end of session; pt opts for trial of this at home as she has another apt right after this today  PATIENT EDUCATION:  Education details: Discussed PT POC/goals, importance of activity pacing with her daily tasks for sx management right now as sx are mod/major irritability stage right now (5-9/10 pain and takes hours to improve); HEP instruction- see below Person educated: Patient Education method: Explanation, Demonstration, Tactile cues, Verbal cues, and Handouts Education comprehension: verbalized understanding and returned demonstration  HOME EXERCISE PROGRAM: Access Code: [email or web address] URL: [email or web address] Date: 07/24/2023 Prepared by: Lucrecia Sables  Exercises - Seated Heel Slide  - 2 x daily - 7 x weekly - 2 sets - 10 reps - Seated Calf Stretch with Strap  - 2 x daily - 7 x weekly - 3 sets - 30 seconds hold  ASSESSMENT:  CLINICAL IMPRESSION: Patient arrived with c/o increased pain compared to last session.  Focused tx today on gentle therapeutic exercises/activities for pain control and discussed pain control strategies at home with activity pacing and modalities.  After exercises she reports feeling like standing up from tx table and amb out of clinic was "easier" and pain decreased to 4-5/10 after PT today.  Her sx are currently mod/majorly irritable and significantly limiting her ability to perform house work, cooking, stair navigation, lifting/carrying, walking >2 blocks (unable per LEFS today), and standing >1 hr.  She is very motivated to participate in PT and should do well with a course of tx to facilitate return to PLOF; expect her to do well as she responded well and was very motivated and compliant with her previous course of PT.   OBJECTIVE IMPAIRMENTS: Abnormal gait, decreased activity tolerance, decreased balance, difficulty walking, decreased ROM, decreased strength, impaired perceived  functional ability, and pain.   ACTIVITY LIMITATIONS: carrying, lifting, bending, sitting, standing, squatting, stairs, transfers, and locomotion level  PARTICIPATION LIMITATIONS: meal prep, cleaning, laundry, interpersonal relationship, driving, shopping, community activity, yard work, and church  PERSONAL FACTORS: Age, Past/current experiences, and Time since onset of injury/illness/exacerbation are also affecting patient's functional outcome.   REHAB POTENTIAL: Excellent  CLINICAL DECISION MAKING: Stable/uncomplicated  EVALUATION COMPLEXITY: Low   GOALS: Goals reviewed with patient? Yes  SHORT TERM GOALS: Target date: 6//25 Pt will demonstrate ability to perform HEP for LE strength with PT verbal/tactile cues needed <50% of the time Baseline: initiated HEP today Goal status: INITIAL    LONG TERM GOALS: Target date: 09/06/23  Improve LEFS >18 (9 MDC) indicating pt is able to perform her daily activities including ascending/descending stairs without being as limited by b/l knee pain Baseline: 25/80 Goal status: INITIAL  2.  Improve LE strength >1/2 MMT grade b/l to facilitate improved ability to stand and walk >2 blocks to/from her church which she was able to do 6 months ago Baseline: unable to amb 2 blocks, 4-/5 hip flex, 4/5 knee ext Goal  status: INITIAL  3.  Pt will be able to perform standing activities (cooking, laundry, cleaning) x 1 hr with <3/10 knee pain Baseline: 5-9/10 knee pain, limited to 10-15 min intervals Goal status: INITIAL   PLAN:  PT FREQUENCY: 2x/week  PT DURATION: 6 weeks  PLANNED INTERVENTIONS: 97110-Therapeutic exercises, 97530- Therapeutic activity, [id number]- Neuromuscular re-education, 97535- Self Care, and [id number]- Manual therapy  PLAN FOR NEXT SESSION: continue progressing HEP; LE strengthening, continue with gradual progression of therapeutic exercises  Lucrecia Sables, PT, DPT, OCS  #[id number]  Kin Penner, PT 07/26/2023, 4:08 PM

## 2023-08-02 ENCOUNTER — Ambulatory Visit

## 2023-08-02 DIAGNOSIS — G8929 Other chronic pain: Secondary | ICD-10-CM

## 2023-08-02 DIAGNOSIS — R053 Chronic cough: Secondary | ICD-10-CM | POA: Diagnosis not present

## 2023-08-02 DIAGNOSIS — M6281 Muscle weakness (generalized): Secondary | ICD-10-CM

## 2023-08-02 DIAGNOSIS — M1712 Unilateral primary osteoarthritis, left knee: Secondary | ICD-10-CM | POA: Diagnosis not present

## 2023-08-02 DIAGNOSIS — R262 Difficulty in walking, not elsewhere classified: Secondary | ICD-10-CM

## 2023-08-02 NOTE — Therapy (Signed)
 OUTPATIENT PHYSICAL THERAPY LOWER EXTREMITY EVALUATION 07/26/23   Patient Name: Andrea Savage MRN: [id number] DOB:05-31-1945, 78 y.o., female Today's Date: 08/02/2023  END OF SESSION:  PT End of Session - 08/02/23 1606     Visit Number 3    Number of Visits 13    Date for PT Re-Evaluation 09/06/23    Authorization Type Medicare    Authorization Time Period 2x/week x 6 weeks ( recert needed at 7/2), PN needed at visit #10    PT Start Time 1415    PT Stop Time 1500    PT Time Calculation (min) 45 min    Activity Tolerance Patient tolerated treatment well    Behavior During Therapy WFL for tasks assessed/performed              Past Medical History:  Diagnosis Date   (HFpEF) heart failure with preserved ejection fraction (HCC)    Anxiety    Asthma    Depression    Fibromyalgia    GERD (gastroesophageal reflux disease)    Grade II diastolic dysfunction    H/O scarlet fever    as child   Heart murmur    s/p scarlet fever as child   HLD (hyperlipidemia)    HTN (hypertension)    IBS (irritable bowel syndrome)    Low bone density    Mitral regurgitation    OA (osteoarthritis)    Osteopenia    Palpitations    PHN (postherpetic neuralgia)    PSVT (paroxysmal supraventricular tachycardia) (HCC)    Recurrent syncope    Rosacea    Seizure (HCC) 1978   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   Squamous cell carcinoma of skin 12/15/2021   R spinal mid lower back, EDC   Wears hearing aid in both ears    Past Surgical History:  Procedure Laterality Date   ABDOMINAL HYSTERECTOMY  2009   Total   BREAST EXCISIONAL BIOPSY Left 1979   NEG   COLONOSCOPY  [id number]   repeat 10 years   COLONOSCOPY WITH PROPOFOL  N/A 03/24/2020   Procedure: COLONOSCOPY WITH PROPOFOL ;  Surgeon: Shane Darling, MD;  Location: [facility];  Service: Endoscopy;  Laterality: N/A;   ESOPHAGOGASTRODUODENOSCOPY (EGD) WITH PROPOFOL  N/A 03/24/2020   Procedure: ESOPHAGOGASTRODUODENOSCOPY (EGD) WITH  PROPOFOL ;  Surgeon: Shane Darling, MD;  Location: [facility];  Service: Endoscopy;  Laterality: N/A;   EXCISION MORTON'S NEUROMA Left 09/21/2017   Procedure: EXCISION MORTON'S NEUROMA;  Surgeon: Sharlyn Deaner, DPM;  Location: [facility];  Service: Podiatry;  Laterality: Left;   FOOT SURGERY Right 1990   HAND SURGERY Right    KNEE ARTHROPLASTY Right 12/14/2020   Procedure: COMPUTER ASSISTED TOTAL KNEE ARTHROPLASTY;  Surgeon: Arlyne Lame, MD;  Location: [facility];  Service: Orthopedics;  Laterality: Right;   KNEE ARTHROSCOPY Right 10/14/2020   Procedure: ARTHROSCOPY KNEE, LATERAL MENISECTOMY;  Surgeon: Arlyne Lame, MD;  Location: [facility];  Service: Orthopedics;  Laterality: Right;   LAPAROSCOPIC OOPHERECTOMY     MINOR HARDWARE REMOVAL Left 11/22/2018   Procedure: REMOVAL SCREW;DEEP 2nd and 3RD LEFT CAPSULE RELEASE OF THIRD METATARSAL PHALANGEAL JOINT LEFT, TENDON LENGTHING THIRD TOE LEFT AND CORTISONE INJECTION LEFT;  Surgeon: Sharlyn Deaner, DPM;  Location: [facility];  Service: Podiatry;  Laterality: Left;  LMA OR IVA LOCAL   TONSILLECTOMY     TONSILLECTOMY     WEIL OSTEOTOMY Left 09/21/2017   Procedure: WEIL OSTEOTOMY-2ND & 3RD TOES;  Surgeon: Sharlyn Deaner, DPM;  Location: [facility];  Service: Podiatry;  Laterality: Left;  LMA WITH LOCAL MINI MONSTER DR Ulanda Gambles WANTS TED HOSE ON PATIENT'S RIGHT LEG   Patient Active Problem List   Diagnosis Date Noted   Nonrheumatic mitral valve regurgitation 12/18/2021   Essential hypertension 12/18/2021   Total knee replacement status 12/14/2020   Claudication in peripheral vascular disease (HCC) 10/04/2020   Chronic heart failure with preserved ejection fraction (HFpEF) (HCC) 12/17/2019   Chronic cough 10/15/2019   Bronchitis 10/15/2019   Hyperlipidemia LDL goal <100 07/22/2019   Paroxysmal SVT (supraventricular tachycardia) (HCC) 06/13/2018   Near syncope 09/11/2017   Asthma 03/23/2017    Fibromyalgia 03/10/2016   Sensorineural hearing loss (SNHL) of left ear with restricted hearing of right ear 12/29/2015   Post herpetic neuralgia 11/12/2015   Gastro-esophageal reflux disease without esophagitis 09/21/2015   Depression 06/29/2015   Major depressive disorder, single episode 06/29/2015   Osteopenia    Reflux    Rosacea     PCP: Dr. Primus Brookes, MD  REFERRING PROVIDER: Dr. Aubry Blase, MD  REFERRING DIAG:  [phone] (ICD-10-CM) - Presence of right artificial knee joint  M70.51 (ICD-10-CM) - Other bursitis of knee, right knee  M70.52 (ICD-10-CM) - Other bursitis of knee, left knee  M70.62 (ICD-10-CM) - Trochanteric bursitis, left hip    THERAPY DIAG:  Primary osteoarthritis of left knee  Muscle weakness (generalized)  Difficulty in walking, not elsewhere classified  Chronic pain of right knee  Rationale for Evaluation and Treatment: Rehabilitation  ONSET DATE: chronic  SUBJECTIVE:   SUBJECTIVE STATEMENT: Pt recently returned to [place] after a trip to [place].  She used the trekking pole while traveling in [place] x 2 weeks.  She was very active while traveling- walking up steep inclines/castles/forts/longer distances.  Did a LOT of walking, a lot of steps, walking on cobblestone streets, most days >10,000 steps.     C/o: R knee pain worsened when she returned to [place]; and L knee pain worsened too, not as significantly as R.  Has had a flare up of her fibromyalgia.  She is also dealing with a chronic cough.    She saw Dr. Hooten when she returned back to [facility] because she was concerned about the pain in her R knee (which is her knee that has TKA).  She reports having imaging of R knee which showed her TKA is stable.   Overall her sx are slowly starting to improve since she saw Dr. Hooten a few weeks ago.  PERTINENT HISTORY: Had a course of PT that ended in March 2025 Saw Orthopedics before [place]- she had an injection in L hip gr trochanter- this is doing well She also had L  knee injection before her previous course of PT and responded well to that tx  PAIN:  Are you having pain? Yes: 5/10 currently; at worst 9/10, 2/10 best   PRECAUTIONS: none  RED FLAGS: None   WEIGHT BEARING RESTRICTIONS: No  FALLS:  Has patient fallen in last 6 months? No  LIVING ENVIRONMENT: Lives with: lives with their family Lives in: House/apartment Stairs: 1 flight inside (bedroom is on first floor) Has following equipment at home: trekking poles  OCCUPATION: retired  PLOF: Independent  PATIENT GOALS: to improve her knee/LE pain, to be able to navigate the stairs in her home comfortably and also the stairs at her family's home when she travels to visit them in a few weeks  NEXT MD VISIT: none reported  OBJECTIVE:  Note: Objective measures were completed at Evaluation  unless otherwise noted.  DIAGNOSTIC FINDINGS: x-rays (see chart)  PATIENT SURVEYS:  LEFS  Extreme difficulty/unable (0), Quite a bit of difficulty (1), Moderate difficulty (2), Little difficulty (3), No difficulty (4) Survey date:  07/24/23  Any of your usual work, housework or school activities 1  2. Usual hobbies, recreational or sporting activities 1  3. Getting into/out of the bath 3  4. Walking between rooms 3  5. Putting on socks/shoes 3  6. Squatting  0  7. Lifting an object, like a bag of groceries from the floor 2  8. Performing light activities around your home 0  9. Performing heavy activities around your home 2  10. Getting into/out of a car 4  11. Walking 2 blocks 0  12. Walking 1 mile 0  13. Going up/down 10 stairs (1 flight) 0  14. Standing for 1 hour 2  15.  sitting for 1 hour 2  16. Running on even ground 0  17. Running on uneven ground 0  18. Making sharp turns while running fast 0  19. Hopping  0  20. Rolling over in bed 2  Score total:  25/80     COGNITION: Overall cognitive status: Within functional limits for tasks assessed     SENSATION: WFL  MUSCLE  LENGTH: Normal Hamstring mobility b/l with SLR  PALPATION: Pt reports significant tenderness with palpation along R and L pes anserine, L distal ITB, b/l quadriceps tendon,   LOWER EXTREMITY ROM:  Active ROM Right eval Left eval  Hip flexion    Hip extension    Hip abduction    Hip adduction    Hip internal rotation    Hip external rotation    Knee flexion 130 135  Knee extension 0 0  Ankle dorsiflexion    Ankle plantarflexion    Ankle inversion    Ankle eversion     (Blank rows = not tested)  LOWER EXTREMITY MMT:  MMT Right eval Left eval  Hip flexion 4- 4-  Hip extension 4- 4-  Hip abduction 4 4  Hip adduction    Hip internal rotation    Hip external rotation    Knee flexion 4 4  Knee extension 4 4  Ankle dorsiflexion    Ankle plantarflexion    Ankle inversion    Ankle eversion     (Blank rows = not tested)   FUNCTIONAL TESTS:  5 times sit to stand: to be administered at next visit Single leg balance: 5 seconds R/L  GAIT: Pt amb without any AD in clinic today; decreased knee flexion noted during swing phase b/l, (+) antalgic gait R LE.                                                                                                                               TREATMENT DATE: 08/02/23  Subjective: pt reports she is feeling better than she did last week.  Pain is improving in  her legs.  She did a lot of standing/cooking this weekend.  Had a pulmonology appointment before PT.  Plans to touch base with her PCP about BP as her reading was elevated at her appointment today.  Pain 2-3/10  Objective: Therapeutic Exercises: Seated marches x10 ea LE Seated knee extension x15 ea LE Heel slides x 10 ea Gentle LE PROM with PT for sx management: hip flexion (SKTC), knee flexion/extension SAQ: x20 ea LE   Therapeutic Activities: Sit to stand x5, 2 sets Discussed strategies for activity pacing and modification, breaking tasks down into smaller components at home  vs prolonged standing/walking for hours for sx management  Discussed option for cold pack for pain control at end of session; pt opts for trial of this at home as she has another apt right after this today  PATIENT EDUCATION:  Education details: Discussed PT POC/goals, importance of activity pacing with her daily tasks for sx management right now as sx are mod/major irritability stage right now (5-9/10 pain and takes hours to improve); HEP instruction- see below Person educated: Patient Education method: Explanation, Demonstration, Tactile cues, Verbal cues, and Handouts Education comprehension: verbalized understanding and returned demonstration  HOME EXERCISE PROGRAM: Access Code: [email or web address] URL: [email or web address] Date: 07/24/2023 Prepared by: Lucrecia Sables  Exercises - Seated Heel Slide  - 2 x daily - 7 x weekly - 2 sets - 10 reps - Seated Calf Stretch with Strap  - 2 x daily - 7 x weekly - 3 sets - 30 seconds hold  ASSESSMENT:  CLINICAL IMPRESSION: Patient arrived with c/o increased pain compared to last session.  Focused tx today on gentle therapeutic exercises/activities for pain control and discussed pain control strategies at home with activity pacing and modalities.  After exercises she reports feeling like standing up from tx table and amb out of clinic was "easier" and pain decreased to 4-5/10 after PT today.  Her sx are currently mod/majorly irritable and significantly limiting her ability to perform house work, cooking, stair navigation, lifting/carrying, walking >2 blocks (unable per LEFS today), and standing >1 hr.  She is very motivated to participate in PT and should do well with a course of tx to facilitate return to PLOF; expect her to do well as she responded well and was very motivated and compliant with her previous course of PT.   OBJECTIVE IMPAIRMENTS: Abnormal gait, decreased activity tolerance, decreased balance, difficulty walking, decreased ROM,  decreased strength, impaired perceived functional ability, and pain.   ACTIVITY LIMITATIONS: carrying, lifting, bending, sitting, standing, squatting, stairs, transfers, and locomotion level  PARTICIPATION LIMITATIONS: meal prep, cleaning, laundry, interpersonal relationship, driving, shopping, community activity, yard work, and church  PERSONAL FACTORS: Age, Past/current experiences, and Time since onset of injury/illness/exacerbation are also affecting patient's functional outcome.   REHAB POTENTIAL: Excellent  CLINICAL DECISION MAKING: Stable/uncomplicated  EVALUATION COMPLEXITY: Low   GOALS: Goals reviewed with patient? Yes  SHORT TERM GOALS: Target date: 6//25 Pt will demonstrate ability to perform HEP for LE strength with PT verbal/tactile cues needed <50% of the time Baseline: initiated HEP today Goal status: INITIAL    LONG TERM GOALS: Target date: 09/06/23  Improve LEFS >18 (9 MDC) indicating pt is able to perform her daily activities including ascending/descending stairs without being as limited by b/l knee pain Baseline: 25/80 Goal status: INITIAL  2.  Improve LE strength >1/2 MMT grade b/l to facilitate improved ability to stand and walk >2 blocks to/from her church which she was able to do 6 months  ago Baseline: unable to amb 2 blocks, 4-/5 hip flex, 4/5 knee ext Goal status: INITIAL  3.  Pt will be able to perform standing activities (cooking, laundry, cleaning) x 1 hr with <3/10 knee pain Baseline: 5-9/10 knee pain, limited to 10-15 min intervals Goal status: INITIAL   PLAN:  PT FREQUENCY: 2x/week  PT DURATION: 6 weeks  PLANNED INTERVENTIONS: 97110-Therapeutic exercises, 97530- Therapeutic activity, [id number]- Neuromuscular re-education, 97535- Self Care, and [id number]- Manual therapy  PLAN FOR NEXT SESSION: continue progressing HEP; LE strengthening, continue with gradual progression of therapeutic exercises  Lucrecia Sables, PT, DPT, OCS  #[id number]  Kin Penner, PT 08/02/2023, 4:07 PM

## 2023-08-04 ENCOUNTER — Ambulatory Visit

## 2023-08-04 DIAGNOSIS — M6281 Muscle weakness (generalized): Secondary | ICD-10-CM

## 2023-08-04 DIAGNOSIS — R262 Difficulty in walking, not elsewhere classified: Secondary | ICD-10-CM

## 2023-08-04 DIAGNOSIS — G8929 Other chronic pain: Secondary | ICD-10-CM

## 2023-08-04 DIAGNOSIS — M1712 Unilateral primary osteoarthritis, left knee: Secondary | ICD-10-CM

## 2023-08-04 NOTE — Therapy (Signed)
 OUTPATIENT PHYSICAL THERAPY LOWER EXTREMITY EVALUATION    Patient Name: Andrea Savage MRN: [id number] DOB:1945/05/12, 78 y.o., female Today's Date: 08/04/2023  END OF SESSION:  PT End of Session - 08/04/23 1223     Visit Number 4    Number of Visits 13    Date for PT Re-Evaluation 09/06/23    Authorization Type Medicare    Authorization Time Period 2x/week x 6 weeks ( recert needed at 7/2), PN needed at visit #10    PT Start Time 1115    PT Stop Time 1200    PT Time Calculation (min) 45 min    Activity Tolerance Patient tolerated treatment well    Behavior During Therapy WFL for tasks assessed/performed              Past Medical History:  Diagnosis Date   (HFpEF) heart failure with preserved ejection fraction (HCC)    Anxiety    Asthma    Depression    Fibromyalgia    GERD (gastroesophageal reflux disease)    Grade II diastolic dysfunction    H/O scarlet fever    as child   Heart murmur    s/p scarlet fever as child   HLD (hyperlipidemia)    HTN (hypertension)    IBS (irritable bowel syndrome)    Low bone density    Mitral regurgitation    OA (osteoarthritis)    Osteopenia    Palpitations    PHN (postherpetic neuralgia)    PSVT (paroxysmal supraventricular tachycardia) (HCC)    Recurrent syncope    Rosacea    Seizure (HCC) 1978   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   Squamous cell carcinoma of skin 12/15/2021   R spinal mid lower back, EDC   Wears hearing aid in both ears    Past Surgical History:  Procedure Laterality Date   ABDOMINAL HYSTERECTOMY  2009   Total   BREAST EXCISIONAL BIOPSY Left 1979   NEG   COLONOSCOPY  [id number]   repeat 10 years   COLONOSCOPY WITH PROPOFOL  N/A 03/24/2020   Procedure: COLONOSCOPY WITH PROPOFOL ;  Surgeon: Shane Darling, MD;  Location: [facility];  Service: Endoscopy;  Laterality: N/A;   ESOPHAGOGASTRODUODENOSCOPY (EGD) WITH PROPOFOL  N/A 03/24/2020   Procedure: ESOPHAGOGASTRODUODENOSCOPY (EGD) WITH  PROPOFOL ;  Surgeon: Shane Darling, MD;  Location: [facility];  Service: Endoscopy;  Laterality: N/A;   EXCISION MORTON'S NEUROMA Left 09/21/2017   Procedure: EXCISION MORTON'S NEUROMA;  Surgeon: Sharlyn Deaner, DPM;  Location: [facility];  Service: Podiatry;  Laterality: Left;   FOOT SURGERY Right 1990   HAND SURGERY Right    KNEE ARTHROPLASTY Right 12/14/2020   Procedure: COMPUTER ASSISTED TOTAL KNEE ARTHROPLASTY;  Surgeon: Arlyne Lame, MD;  Location: [facility];  Service: Orthopedics;  Laterality: Right;   KNEE ARTHROSCOPY Right 10/14/2020   Procedure: ARTHROSCOPY KNEE, LATERAL MENISECTOMY;  Surgeon: Arlyne Lame, MD;  Location: [facility];  Service: Orthopedics;  Laterality: Right;   LAPAROSCOPIC OOPHERECTOMY     MINOR HARDWARE REMOVAL Left 11/22/2018   Procedure: REMOVAL SCREW;DEEP 2nd and 3RD LEFT CAPSULE RELEASE OF THIRD METATARSAL PHALANGEAL JOINT LEFT, TENDON LENGTHING THIRD TOE LEFT AND CORTISONE INJECTION LEFT;  Surgeon: Sharlyn Deaner, DPM;  Location: [facility];  Service: Podiatry;  Laterality: Left;  LMA OR IVA LOCAL   TONSILLECTOMY     TONSILLECTOMY     WEIL OSTEOTOMY Left 09/21/2017   Procedure: WEIL OSTEOTOMY-2ND & 3RD TOES;  Surgeon: Sharlyn Deaner, DPM;  Location: [facility];  Service: Podiatry;  Laterality: Left;  LMA WITH LOCAL MINI MONSTER DR Ulanda Gambles WANTS TED HOSE ON PATIENT'S RIGHT LEG   Patient Active Problem List   Diagnosis Date Noted   Nonrheumatic mitral valve regurgitation 12/18/2021   Essential hypertension 12/18/2021   Total knee replacement status 12/14/2020   Claudication in peripheral vascular disease (HCC) 10/04/2020   Chronic heart failure with preserved ejection fraction (HFpEF) (HCC) 12/17/2019   Chronic cough 10/15/2019   Bronchitis 10/15/2019   Hyperlipidemia LDL goal <100 07/22/2019   Paroxysmal SVT (supraventricular tachycardia) (HCC) 06/13/2018   Near syncope 09/11/2017   Asthma 03/23/2017    Fibromyalgia 03/10/2016   Sensorineural hearing loss (SNHL) of left ear with restricted hearing of right ear 12/29/2015   Post herpetic neuralgia 11/12/2015   Gastro-esophageal reflux disease without esophagitis 09/21/2015   Depression 06/29/2015   Major depressive disorder, single episode 06/29/2015   Osteopenia    Reflux    Rosacea     PCP: Dr. Primus Brookes, MD  REFERRING PROVIDER: Dr. Aubry Blase, MD  REFERRING DIAG:  [phone] (ICD-10-CM) - Presence of right artificial knee joint  M70.51 (ICD-10-CM) - Other bursitis of knee, right knee  M70.52 (ICD-10-CM) - Other bursitis of knee, left knee  M70.62 (ICD-10-CM) - Trochanteric bursitis, left hip    THERAPY DIAG:  Primary osteoarthritis of left knee  Muscle weakness (generalized)  Difficulty in walking, not elsewhere classified  Chronic pain of right knee  Rationale for Evaluation and Treatment: Rehabilitation  ONSET DATE: chronic  SUBJECTIVE:   SUBJECTIVE STATEMENT: Pt recently returned to [place] after a trip to [place].  She used the trekking pole while traveling in [place] x 2 weeks.  She was very active while traveling- walking up steep inclines/castles/forts/longer distances.  Did a LOT of walking, a lot of steps, walking on cobblestone streets, most days >10,000 steps.     C/o: R knee pain worsened when she returned to [place]; and L knee pain worsened too, not as significantly as R.  Has had a flare up of her fibromyalgia.  She is also dealing with a chronic cough.    She saw Dr. Hooten when she returned back to [facility] because she was concerned about the pain in her R knee (which is her knee that has TKA).  She reports having imaging of R knee which showed her TKA is stable.   Overall her sx are slowly starting to improve since she saw Dr. Hooten a few weeks ago.  PERTINENT HISTORY: Had a course of PT that ended in March 2025 Saw Orthopedics before [place]- she had an injection in L hip gr trochanter- this is doing well She also had L  knee injection before her previous course of PT and responded well to that tx  PAIN:  Are you having pain? Yes: 5/10 currently; at worst 9/10, 2/10 best   PRECAUTIONS: none  RED FLAGS: None   WEIGHT BEARING RESTRICTIONS: No  FALLS:  Has patient fallen in last 6 months? No  LIVING ENVIRONMENT: Lives with: lives with their family Lives in: House/apartment Stairs: 1 flight inside (bedroom is on first floor) Has following equipment at home: trekking poles  OCCUPATION: retired  PLOF: Independent  PATIENT GOALS: to improve her knee/LE pain, to be able to navigate the stairs in her home comfortably and also the stairs at her family's home when she travels to visit them in a few weeks  NEXT MD VISIT: none reported  OBJECTIVE:  Note: Objective measures were completed at Evaluation  unless otherwise noted.  DIAGNOSTIC FINDINGS: x-rays (see chart)  PATIENT SURVEYS:  LEFS  Extreme difficulty/unable (0), Quite a bit of difficulty (1), Moderate difficulty (2), Little difficulty (3), No difficulty (4) Survey date:  07/24/23  Any of your usual work, housework or school activities 1  2. Usual hobbies, recreational or sporting activities 1  3. Getting into/out of the bath 3  4. Walking between rooms 3  5. Putting on socks/shoes 3  6. Squatting  0  7. Lifting an object, like a bag of groceries from the floor 2  8. Performing light activities around your home 0  9. Performing heavy activities around your home 2  10. Getting into/out of a car 4  11. Walking 2 blocks 0  12. Walking 1 mile 0  13. Going up/down 10 stairs (1 flight) 0  14. Standing for 1 hour 2  15.  sitting for 1 hour 2  16. Running on even ground 0  17. Running on uneven ground 0  18. Making sharp turns while running fast 0  19. Hopping  0  20. Rolling over in bed 2  Score total:  25/80     COGNITION: Overall cognitive status: Within functional limits for tasks assessed     SENSATION: WFL  MUSCLE  LENGTH: Normal Hamstring mobility b/l with SLR  PALPATION: Pt reports significant tenderness with palpation along R and L pes anserine, L distal ITB, b/l quadriceps tendon,   LOWER EXTREMITY ROM:  Active ROM Right eval Left eval  Hip flexion    Hip extension    Hip abduction    Hip adduction    Hip internal rotation    Hip external rotation    Knee flexion 130 135  Knee extension 0 0  Ankle dorsiflexion    Ankle plantarflexion    Ankle inversion    Ankle eversion     (Blank rows = not tested)  LOWER EXTREMITY MMT:  MMT Right eval Left eval  Hip flexion 4- 4-  Hip extension 4- 4-  Hip abduction 4 4  Hip adduction    Hip internal rotation    Hip external rotation    Knee flexion 4 4  Knee extension 4 4  Ankle dorsiflexion    Ankle plantarflexion    Ankle inversion    Ankle eversion     (Blank rows = not tested)   FUNCTIONAL TESTS:  5 times sit to stand: to be administered at next visit Single leg balance: 5 seconds R/L  GAIT: Pt amb without any AD in clinic today; decreased knee flexion noted during swing phase b/l, (+) antalgic gait R LE.                                                                                                                               TREATMENT DATE: 08/04/23  Subjective: pt reports she overall his starting to feel less pain in her knees since starting PT.  She is traveling next week and expresses some apprehension/fear about the stairs she will have to navigate at her family's home in [place]- her bedroom will be upstairs for this trip.  Pain 2-3/10  Objective: Therapeutic Exercises: Seated marches 2x10 ea LE, 2# Seated knee extension x15 ea LE 2# ea, 2 sets Heel slides x 10 ea, 2 sets Sit to stand: x10 SAQ: x20 ea LE- not today Seated hip abd: blue TB at thighs x 15, 2 sets Heel raises: 2x15   Manual Therapy Gentle LE PROM with PT for sx management: hip flexion (SKTC), knee flexion/extension MWM: tibial IR with knee  flexion, ER with knee extension, b/l LE 10 x ea   Therapeutic Activities: Stair navigation: practiced ascending/descending 4 steps in sequence multiple times- able to ascend using reciprocal pattern, descent practiced step-to gait leading with R LE, also practiced reciprocal holding onto railing on R and facing the R (slight angle).  Discussed biomechanics for stair descent.  Taking her time, concentrating on on that single task and not trying to carry items up/down.  Wearing shoes on stairs.    Discussed strategies for activity pacing and modification, breaking tasks down into smaller components at home vs prolonged standing/walking for hours for sx management    PATIENT EDUCATION:  Education details: Discussed PT POC/goals, importance of activity pacing with her daily tasks for sx management right now as sx are mod/major irritability stage right now (5-9/10 pain and takes hours to improve); HEP instruction- see below Person educated: Patient Education method: Explanation, Demonstration, Tactile cues, Verbal cues, and Handouts Education comprehension: verbalized understanding and returned demonstration  HOME EXERCISE PROGRAM: Access Code: [email or web address] URL: [email or web address] Date: 07/24/2023 Prepared by: Lucrecia Sables  Exercises - Seated Heel Slide  - 2 x daily - 7 x weekly - 2 sets - 10 reps - Seated Calf Stretch with Strap  - 2 x daily - 7 x weekly - 3 sets - 30 seconds hold  ASSESSMENT:  CLINICAL IMPRESSION: Patient seems to be responding well to this course of PT so far; overall her pain level is reduced to 2-3/10 now compared to when she started tx.  Demonstrated ability to safely ascend/descend stairs in clinic and pt reports reduced fear/kinesiophopbia with this activity for traveling next week.  Would benefit from progressing LE strength, especially in posterior chain to promote improved gait mechanics including improved hip extension at terminal stance.  Her sx  are currently mod irritable and significantly limiting her ability to perform house work, cooking, stair navigation, lifting/carrying, walking >2 blocks (unable per LEFS today), and standing >1 hr.  She is very motivated to participate in PT and should do well with a course of tx to facilitate return to PLOF; expect her to do well as she responded well and was very motivated and compliant with her previous course of PT.   OBJECTIVE IMPAIRMENTS: Abnormal gait, decreased activity tolerance, decreased balance, difficulty walking, decreased ROM, decreased strength, impaired perceived functional ability, and pain.   ACTIVITY LIMITATIONS: carrying, lifting, bending, sitting, standing, squatting, stairs, transfers, and locomotion level  PARTICIPATION LIMITATIONS: meal prep, cleaning, laundry, interpersonal relationship, driving, shopping, community activity, yard work, and church  PERSONAL FACTORS: Age, Past/current experiences, and Time since onset of injury/illness/exacerbation are also affecting patient's functional outcome.   REHAB POTENTIAL: Excellent  CLINICAL DECISION MAKING: Stable/uncomplicated  EVALUATION COMPLEXITY: Low   GOALS: Goals reviewed with patient? Yes  SHORT TERM GOALS: Target date: 6//25 Pt will demonstrate ability to perform HEP for LE strength  with PT verbal/tactile cues needed <50% of the time Baseline: initiated HEP today Goal status: INITIAL    LONG TERM GOALS: Target date: 09/06/23  Improve LEFS >18 (9 MDC) indicating pt is able to perform her daily activities including ascending/descending stairs without being as limited by b/l knee pain Baseline: 25/80 Goal status: INITIAL  2.  Improve LE strength >1/2 MMT grade b/l to facilitate improved ability to stand and walk >2 blocks to/from her church which she was able to do 6 months ago Baseline: unable to amb 2 blocks, 4-/5 hip flex, 4/5 knee ext Goal status: INITIAL  3.  Pt will be able to perform standing  activities (cooking, laundry, cleaning) x 1 hr with <3/10 knee pain Baseline: 5-9/10 knee pain, limited to 10-15 min intervals Goal status: INITIAL   PLAN:  PT FREQUENCY: 2x/week  PT DURATION: 6 weeks  PLANNED INTERVENTIONS: 97110-Therapeutic exercises, 97530- Therapeutic activity, 97112- Neuromuscular re-education, 97535- Self Care, and 96295- Manual therapy  PLAN FOR NEXT SESSION: continue progressing HEP; LE strengthening, continue with gradual progression of therapeutic exercises  Lucrecia Sables, PT, DPT, OCS  #[id number]  Kin Penner, PT 08/04/2023, 12:25 PM

## 2023-08-07 ENCOUNTER — Ambulatory Visit: Admitting: Student

## 2023-08-07 ENCOUNTER — Encounter

## 2023-08-09 ENCOUNTER — Encounter

## 2023-08-13 DIAGNOSIS — S6991XA Unspecified injury of right wrist, hand and finger(s), initial encounter: Secondary | ICD-10-CM | POA: Diagnosis not present

## 2023-08-14 ENCOUNTER — Ambulatory Visit

## 2023-08-14 DIAGNOSIS — M25561 Pain in right knee: Secondary | ICD-10-CM | POA: Insufficient documentation

## 2023-08-14 DIAGNOSIS — G8929 Other chronic pain: Secondary | ICD-10-CM | POA: Insufficient documentation

## 2023-08-14 DIAGNOSIS — M1712 Unilateral primary osteoarthritis, left knee: Secondary | ICD-10-CM | POA: Insufficient documentation

## 2023-08-14 DIAGNOSIS — R262 Difficulty in walking, not elsewhere classified: Secondary | ICD-10-CM | POA: Insufficient documentation

## 2023-08-14 DIAGNOSIS — M6281 Muscle weakness (generalized): Secondary | ICD-10-CM | POA: Insufficient documentation

## 2023-08-16 ENCOUNTER — Encounter

## 2023-08-16 NOTE — Therapy (Signed)
 Pt arrived to PT clinic for appointment;  was checked in, then decided to not stay for session due to not feeling up to at as she sustained a recent hand injury.  Would like to wait until next scheduled visit to resume tx.  No PT interventions performed today. Lucrecia Sables, PT, DPT, OCS

## 2023-08-18 NOTE — Progress Notes (Signed)
 Cardiology Clinic Note   Date: 08/28/2023 ID: Yvone, Slape 1945/08/20, MRN [id number]  Primary Cardiologist:  Lonni Hanson, MD  Chief Complaint   KIELEE CARE is a 78 y.o. female who presents to the clinic today for routine follow up.   Patient Profile   Andrea Savage is followed by Dr. Hanson for the history outlined below.      Past medical history significant for: Chronic diastolic heart failure. Echo 03/09/2023: EF 60-65%. No RWMA. Grade I DD. Normal RV size/function. No significant valvular abnormalities.  Hypertension. Hyperlipidemia. Lipid panel 02/23/2022: LDL 66, HDL 68, TG 165, total 167. PSVT. 14-day ZIO 10/24/2017: HR 53 to 135 bpm, average 76 bpm.  Rare PACs/PVCs.  33 atrial runs with max rate 203 bpm, longest lasting 17.3 seconds.  No sustained arrhythmia or prolonged pauses identified.  Triggered events corresponded to sinus rhythm, PACs, and atrial runs. GERD. Fibromyalgia.  In summary, patient was first evaluated by Dr. Hanson on 09/13/2017 for near syncope/syncope and shortness of breath.  3 days prior to her visit she had been evaluated in the emergency department for shortness of breath felt to be anxiety.  She was pending foot surgery.  Nuclear stress testing for risk stratification was a normal low risk study.  Echo showed normal LV function with mild MR.  14-day ZIO showed runs of SVT as detailed above.   Patient was last seen in the office by me on 02/20/2023 for routine follow up. She reported left flank/lower chest pain under left breast occurring randomly lasting up to 10 minutes and resolving on its own. Pain typically not related to exertion. She endorsed increased DOE. She also reported occasional, brief palpitations typically triggered by chronic cough. Echo was updated which showed normal LV/RV function as detailed above.      History of Present Illness    Today, patient is here alone. She reports she recently returned from a trip to  [place]. She states she did not have her chronic cough the entire time she was there. She pushed herself to do a lot of walking despite chronic pain in her knees. She states as soon as she got back to [place] her chronic cough returned. She is pending allergy testing at [facility] in September. She also reports since returning she has noticed increased weakness in bilateral legs. She states her tolerance for walking and standing are decreased. She completed physical therapy for her knee and is planning to begin seated exercises at home. She reported noticing mild edema in bilateral ankles. She also noted an increase in BP with SBP as high as 170. She does not check her BP at home. She is normotensive today. She stopped Zetia  2 months ago secondary to cost. She denies chest pain, pressure or tightness. Palpitations are occasional and typically related to coughing spells.     ROS: All other systems reviewed and are otherwise negative except as noted in History of Present Illness.  EKGs/Labs Reviewed    EKG Interpretation Date/Time:  Monday August 28 2023 09:21:31 EDT Ventricular Rate:  61 PR Interval:  160 QRS Duration:  56 QT Interval:  406 QTC Calculation: 408 R Axis:   11  Text Interpretation: Normal sinus rhythm Early repolarization Normal ECG When compared with ECG of 20-Feb-2023 09:22, No significant change was found Confirmed by Loistine Sober [phone]) on 08/28/2023 9:23:28 AM    Physical Exam    VS:  BP 120/74 (BP Location: Left Arm, Patient Position: Sitting, Cuff Size: Large)  Pulse 61   Ht 5' 4.5 (1.638 m)   Wt 159 lb 2 oz (72.2 kg)   SpO2 98%   BMI 26.89 kg/m  , BMI Body mass index is 26.89 kg/m.  GEN: Well nourished, well developed, in no acute distress. Neck: No JVD or carotid bruits. Cardiac:  RRR.  No murmur. No rubs or gallops.   Respiratory:  Respirations regular and unlabored. Clear to auscultation without rales, wheezing or rhonchi. GI: Soft, nontender,  nondistended. Extremities: Radials/DP/PT 2+ and equal bilaterally. No clubbing or cyanosis. No edema.  Skin: Warm and dry, no rash. Neuro: Strength intact.  Assessment & Plan   Chronic diastolic heart failure Echo January 2025 showed normal LV/RV function with grade I DD and no significant valvular abnormalities. Patient reports mild bilateral ankle edema. No shortness of breath or DOE. No lower extremity edema today. Euvolemic and well compensated on exam.  -Continue Toprol , Lasix  as needed.   Palpitations/PSVT 14 day Zio August 2019 showed 33 atrial runs. She denies sustained palpitations.  EKG demonstrates NSR.  -Continue Toprol .    Hypertension BP today  120/74. She reports recent increase in BP with SBP as high as 170. No headaches reported. She experiences mild lightheadedness/dizziness when bending over in her garden to pick vegetables. She does not want to change or add medications at this point. She is going to start check BP at home with a wrist cuff. Discussed proper technique.   -Continue Toprol    Hyperlipidemia LDL December 2024 66, at goal. Patient stopped Zetia  2 months ago due to increased cost.   -Lipid panel today. Will discuss restarting Zetia  based on results.   Leg pain/weakness Patient reports walking around [place] with some chronic pain in her knees. Since returning she reports less tolerance for walking and standing with increased knee pain. 2+ DP/PT pulses bilaterally. Ortho feels this could be a fibromyalgia flare.  - Defer further testing at this time.   Disposition: Lipid panel today. Return in 6 months or sooner as needed.          Signed, Barnie HERO. Leticia Mcdiarmid, DNP, NP-C

## 2023-08-21 ENCOUNTER — Ambulatory Visit: Attending: Orthopedic Surgery

## 2023-08-21 DIAGNOSIS — G8929 Other chronic pain: Secondary | ICD-10-CM | POA: Diagnosis not present

## 2023-08-21 DIAGNOSIS — M1712 Unilateral primary osteoarthritis, left knee: Secondary | ICD-10-CM

## 2023-08-21 DIAGNOSIS — M6281 Muscle weakness (generalized): Secondary | ICD-10-CM | POA: Diagnosis not present

## 2023-08-21 DIAGNOSIS — R262 Difficulty in walking, not elsewhere classified: Secondary | ICD-10-CM | POA: Diagnosis not present

## 2023-08-21 DIAGNOSIS — M25561 Pain in right knee: Secondary | ICD-10-CM | POA: Diagnosis not present

## 2023-08-21 NOTE — Therapy (Signed)
 OUTPATIENT PHYSICAL THERAPY LOWER EXTREMITY EVALUATION    Patient Name: Andrea Savage MRN: [id number] DOB:08-Jan-1946, 78 y.o., female Today's Date: 08/21/2023  END OF SESSION:  PT End of Session - 08/21/23 1239     Visit Number 5    Number of Visits 13    Date for PT Re-Evaluation 09/06/23    Authorization Type Medicare    Authorization Time Period 2x/week x 6 weeks ( recert needed at 7/2), PN needed at visit #10    PT Start Time 1245    PT Stop Time 1330    PT Time Calculation (min) 45 min    Activity Tolerance Patient tolerated treatment well    Behavior During Therapy WFL for tasks assessed/performed            Past Medical History:  Diagnosis Date   (HFpEF) heart failure with preserved ejection fraction (HCC)    Anxiety    Asthma    Depression    Fibromyalgia    GERD (gastroesophageal reflux disease)    Grade II diastolic dysfunction    H/O scarlet fever    as child   Heart murmur    s/p scarlet fever as child   HLD (hyperlipidemia)    HTN (hypertension)    IBS (irritable bowel syndrome)    Low bone density    Mitral regurgitation    OA (osteoarthritis)    Osteopenia    Palpitations    PHN (postherpetic neuralgia)    PSVT (paroxysmal supraventricular tachycardia) (HCC)    Recurrent syncope    Rosacea    Seizure (HCC) 1978   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   Squamous cell carcinoma of skin 12/15/2021   R spinal mid lower back, EDC   Wears hearing aid in both ears    Past Surgical History:  Procedure Laterality Date   ABDOMINAL HYSTERECTOMY  2009   Total   BREAST EXCISIONAL BIOPSY Left 1979   NEG   COLONOSCOPY  [id number]   repeat 10 years   COLONOSCOPY WITH PROPOFOL  N/A 03/24/2020   Procedure: COLONOSCOPY WITH PROPOFOL ;  Surgeon: Shane Darling, MD;  Location: [facility];  Service: Endoscopy;  Laterality: N/A;   ESOPHAGOGASTRODUODENOSCOPY (EGD) WITH PROPOFOL  N/A 03/24/2020   Procedure: ESOPHAGOGASTRODUODENOSCOPY (EGD) WITH PROPOFOL ;   Surgeon: Shane Darling, MD;  Location: [facility];  Service: Endoscopy;  Laterality: N/A;   EXCISION MORTON'S NEUROMA Left 09/21/2017   Procedure: EXCISION MORTON'S NEUROMA;  Surgeon: Sharlyn Deaner, DPM;  Location: [facility];  Service: Podiatry;  Laterality: Left;   FOOT SURGERY Right 1990   HAND SURGERY Right    KNEE ARTHROPLASTY Right 12/14/2020   Procedure: COMPUTER ASSISTED TOTAL KNEE ARTHROPLASTY;  Surgeon: Arlyne Lame, MD;  Location: [facility];  Service: Orthopedics;  Laterality: Right;   KNEE ARTHROSCOPY Right 10/14/2020   Procedure: ARTHROSCOPY KNEE, LATERAL MENISECTOMY;  Surgeon: Arlyne Lame, MD;  Location: [facility];  Service: Orthopedics;  Laterality: Right;   LAPAROSCOPIC OOPHERECTOMY     MINOR HARDWARE REMOVAL Left 11/22/2018   Procedure: REMOVAL SCREW;DEEP 2nd and 3RD LEFT CAPSULE RELEASE OF THIRD METATARSAL PHALANGEAL JOINT LEFT, TENDON LENGTHING THIRD TOE LEFT AND CORTISONE INJECTION LEFT;  Surgeon: Sharlyn Deaner, DPM;  Location: [facility];  Service: Podiatry;  Laterality: Left;  LMA OR IVA LOCAL   TONSILLECTOMY     TONSILLECTOMY     WEIL OSTEOTOMY Left 09/21/2017   Procedure: WEIL OSTEOTOMY-2ND & 3RD TOES;  Surgeon: Sharlyn Deaner, DPM;  Location: [facility];  Service: Podiatry;  Laterality: Left;  LMA WITH LOCAL MINI MONSTER DR Ulanda Gambles WANTS TED HOSE ON PATIENT'S RIGHT LEG   Patient Active Problem List   Diagnosis Date Noted   Nonrheumatic mitral valve regurgitation 12/18/2021   Essential hypertension 12/18/2021   Total knee replacement status 12/14/2020   Claudication in peripheral vascular disease (HCC) 10/04/2020   Chronic heart failure with preserved ejection fraction (HFpEF) (HCC) 12/17/2019   Chronic cough 10/15/2019   Bronchitis 10/15/2019   Hyperlipidemia LDL goal <100 07/22/2019   Paroxysmal SVT (supraventricular tachycardia) (HCC) 06/13/2018   Near syncope 09/11/2017   Asthma 03/23/2017   Fibromyalgia  03/10/2016   Sensorineural hearing loss (SNHL) of left ear with restricted hearing of right ear 12/29/2015   Post herpetic neuralgia 11/12/2015   Gastro-esophageal reflux disease without esophagitis 09/21/2015   Depression 06/29/2015   Major depressive disorder, single episode 06/29/2015   Osteopenia    Reflux    Rosacea     PCP: Dr. Primus Brookes, MD  REFERRING PROVIDER: Dr. Aubry Blase, MD  REFERRING DIAG:  [phone] (ICD-10-CM) - Presence of right artificial knee joint  M70.51 (ICD-10-CM) - Other bursitis of knee, right knee  M70.52 (ICD-10-CM) - Other bursitis of knee, left knee  M70.62 (ICD-10-CM) - Trochanteric bursitis, left hip    THERAPY DIAG:  Primary osteoarthritis of left knee  Muscle weakness (generalized)  Difficulty in walking, not elsewhere classified  Chronic pain of right knee  Rationale for Evaluation and Treatment: Rehabilitation  ONSET DATE: chronic  SUBJECTIVE:   SUBJECTIVE STATEMENT: Pt recently returned to [place] after a trip to [place].  She used the trekking pole while traveling in [place] x 2 weeks.  She was very active while traveling- walking up steep inclines/castles/forts/longer distances.  Did a LOT of walking, a lot of steps, walking on cobblestone streets, most days >10,000 steps.     C/o: R knee pain worsened when she returned to [place]; and L knee pain worsened too, not as significantly as R.  Has had a flare up of her fibromyalgia.  She is also dealing with a chronic cough.    She saw Dr. Hooten when she returned back to [facility] because she was concerned about the pain in her R knee (which is her knee that has TKA).  She reports having imaging of R knee which showed her TKA is stable.   Overall her sx are slowly starting to improve since she saw Dr. Hooten a few weeks ago.  PERTINENT HISTORY: Had a course of PT that ended in March 2025 Saw Orthopedics before [place]- she had an injection in L hip gr trochanter- this is doing well She also had L knee  injection before her previous course of PT and responded well to that tx  PAIN:  Are you having pain? Yes: 5/10 currently; at worst 9/10, 2/10 best   PRECAUTIONS: none  RED FLAGS: None   WEIGHT BEARING RESTRICTIONS: No  FALLS:  Has patient fallen in last 6 months? No  LIVING ENVIRONMENT: Lives with: lives with their family Lives in: House/apartment Stairs: 1 flight inside (bedroom is on first floor) Has following equipment at home: trekking poles  OCCUPATION: retired  PLOF: Independent  PATIENT GOALS: to improve her knee/LE pain, to be able to navigate the stairs in her home comfortably and also the stairs at her family's home when she travels to visit them in a few weeks  NEXT MD VISIT: none reported  OBJECTIVE:  Note: Objective measures were completed at Evaluation unless otherwise  noted.  DIAGNOSTIC FINDINGS: x-rays (see chart)  PATIENT SURVEYS:  LEFS  Extreme difficulty/unable (0), Quite a bit of difficulty (1), Moderate difficulty (2), Little difficulty (3), No difficulty (4) Survey date:  07/24/23  Any of your usual work, housework or school activities 1  2. Usual hobbies, recreational or sporting activities 1  3. Getting into/out of the bath 3  4. Walking between rooms 3  5. Putting on socks/shoes 3  6. Squatting  0  7. Lifting an object, like a bag of groceries from the floor 2  8. Performing light activities around your home 0  9. Performing heavy activities around your home 2  10. Getting into/out of a car 4  11. Walking 2 blocks 0  12. Walking 1 mile 0  13. Going up/down 10 stairs (1 flight) 0  14. Standing for 1 hour 2  15.  sitting for 1 hour 2  16. Running on even ground 0  17. Running on uneven ground 0  18. Making sharp turns while running fast 0  19. Hopping  0  20. Rolling over in bed 2  Score total:  25/80     COGNITION: Overall cognitive status: Within functional limits for tasks assessed     SENSATION: WFL  MUSCLE LENGTH: Normal  Hamstring mobility b/l with SLR  PALPATION: Pt reports significant tenderness with palpation along R and L pes anserine, L distal ITB, b/l quadriceps tendon,   LOWER EXTREMITY ROM:  Active ROM Right eval Left eval  Hip flexion    Hip extension    Hip abduction    Hip adduction    Hip internal rotation    Hip external rotation    Knee flexion 130 135  Knee extension 0 0  Ankle dorsiflexion    Ankle plantarflexion    Ankle inversion    Ankle eversion     (Blank rows = not tested)  LOWER EXTREMITY MMT:  MMT Right eval Left eval  Hip flexion 4- 4-  Hip extension 4- 4-  Hip abduction 4 4  Hip adduction    Hip internal rotation    Hip external rotation    Knee flexion 4 4  Knee extension 4 4  Ankle dorsiflexion    Ankle plantarflexion    Ankle inversion    Ankle eversion     (Blank rows = not tested)   FUNCTIONAL TESTS:  5 times sit to stand: to be administered at next visit Single leg balance: 5 seconds R/L  GAIT: Pt amb without any AD in clinic today; decreased knee flexion noted during swing phase b/l, (+) antalgic gait R LE.                                                                                                                               TREATMENT DATE: 08/21/23  Subjective: Pt states overall she feels like her legs are not feeling good; she has concerns and would like  to touch base with her PCP about this.  She was at a church conference this weekend and found that she was fatigued with her walking, it was .25 miles one way from hotel room to the conference center.  Thinking about asking for additional lab work.    Pain 2-3/10- generalized L knee/thigh pain, pain with her R TKA.   BP: 160/69, HR 66 bpm O2 Sat: 98%  Objective: Therapeutic Exercises: Seated marches 2x10 ea LE, 2# Seated knee extension x15 ea LE 2# ea, 2 sets Heel slides x 10 ea, 2 sets Sit to stand: x10 SAQ: x20 ea LE- not today Seated hip abd: blue TB at thighs x 15, 2  sets Heel raises: 2x15 Hip add with ball: 20x Heel raises: 2x15    Manual Therapy- not today Gentle LE PROM with PT for sx management: hip flexion (SKTC), knee flexion/extension MWM: tibial IR with knee flexion, ER with knee extension, b/l LE 10 x ea   Therapeutic Activities: 5xSTS: 19 seconds, 14 seconds  SLS: 30 seconds R and L Stair navigation: practiced ascending/descending 4 steps in sequence multiple times- able to ascend using reciprocal pattern, descent practiced step-to gait leading with R LE, also practiced reciprocal holding onto railing on R and facing the R (slight angle).  Discussed biomechanics for stair descent.  Taking her time, concentrating on on that single task and not trying to carry items up/down.  Wearing shoes on stairs.   Front step ups SLS: 30 second intervals R an dL x 3 ea SLS on airex: 5x 20 sec each  Discussed strategies for activity pacing and modification, breaking tasks down into smaller components at home vs prolonged standing/walking for hours for sx management    PATIENT EDUCATION:  Education details: Discussed PT POC/goals, importance of activity pacing with her daily tasks for sx management right now as sx are mod/major irritability stage right now (5-9/10 pain and takes hours to improve); HEP instruction- see below Person educated: Patient Education method: Explanation, Demonstration, Tactile cues, Verbal cues, and Handouts Education comprehension: verbalized understanding and returned demonstration  HOME EXERCISE PROGRAM: Access Code: [email or web address] URL: [email or web address] Date: 07/24/2023 Prepared by: Lucrecia Sables  Exercises - Seated Heel Slide  - 2 x daily - 7 x weekly - 2 sets - 10 reps - Seated Calf Stretch with Strap  - 2 x daily - 7 x weekly - 3 sets - 30 seconds hold  ASSESSMENT:  CLINICAL IMPRESSION: Patient verbalizes concerns/fear that she has some other underlying medical issues going on in addition the OA and  fibromyalgia that are contributing there her leg pain.  Mentions concern of autoimmune conditions vs MS and does not feel like PT is making a significant impact on her leg sx at this time.  Recommended she f/u with her PCP to address these concerns as they are outside my scope of practice as a PT.  She tolerated the LE strengthening and balance exercises in today's session well, with no c/o increased pain at end of session.  Vital signs were in safe range to exercise, she does have elevated BP and recommended she discuss with her PCP too.  Plan to f/u next visit and likely transition to independent HEP at that time while she follows up with her PCP.  Would benefit from progressing LE strength, especially in posterior chain to promote improved gait mechanics including improved hip extension at terminal stance.  Her sx are currently mod irritable and significantly limiting her ability to perform house work,  cooking, stair navigation, lifting/carrying, walking >2 blocks (unable per LEFS today), and standing >1 hr.  She is very motivated to participate in PT and should do well with a course of tx to facilitate return to PLOF; expect her to do well as she responded well and was very motivated and compliant with her previous course of PT.   OBJECTIVE IMPAIRMENTS: Abnormal gait, decreased activity tolerance, decreased balance, difficulty walking, decreased ROM, decreased strength, impaired perceived functional ability, and pain.   ACTIVITY LIMITATIONS: carrying, lifting, bending, sitting, standing, squatting, stairs, transfers, and locomotion level  PARTICIPATION LIMITATIONS: meal prep, cleaning, laundry, interpersonal relationship, driving, shopping, community activity, yard work, and church  PERSONAL FACTORS: Age, Past/current experiences, and Time since onset of injury/illness/exacerbation are also affecting patient's functional outcome.   REHAB POTENTIAL: Excellent  CLINICAL DECISION MAKING:  Stable/uncomplicated  EVALUATION COMPLEXITY: Low   GOALS: Goals reviewed with patient? Yes  SHORT TERM GOALS: Target date: 6//25 Pt will demonstrate ability to perform HEP for LE strength with PT verbal/tactile cues needed <50% of the time Baseline: initiated HEP today Goal status: INITIAL    LONG TERM GOALS: Target date: 09/06/23  Improve LEFS >18 (9 MDC) indicating pt is able to perform her daily activities including ascending/descending stairs without being as limited by b/l knee pain Baseline: 25/80 Goal status: INITIAL  2.  Improve LE strength >1/2 MMT grade b/l to facilitate improved ability to stand and walk >2 blocks to/from her church which she was able to do 6 months ago Baseline: unable to amb 2 blocks, 4-/5 hip flex, 4/5 knee ext Goal status: INITIAL  3.  Pt will be able to perform standing activities (cooking, laundry, cleaning) x 1 hr with <3/10 knee pain Baseline: 5-9/10 knee pain, limited to 10-15 min intervals Goal status: INITIAL   PLAN:  PT FREQUENCY: 2x/week  PT DURATION: 6 weeks  PLANNED INTERVENTIONS: 97110-Therapeutic exercises, 97530- Therapeutic activity, [id number]- Neuromuscular re-education, 97535- Self Care, and [id number]- Manual therapy  PLAN FOR NEXT SESSION: continue progressing HEP; LE strengthening, continue with gradual progression of therapeutic exercises  Lucrecia Sables, PT, DPT, OCS  #[id number]  Kin Penner, PT 08/21/2023, 2:25 PM

## 2023-08-22 NOTE — Therapy (Signed)
 OUTPATIENT PHYSICAL THERAPY LOWER EXTREMITY TREATMENT & DISCHARGE   Patient Name: Andrea Savage MRN: [id number] DOB:Apr 29, 1945, 78 y.o., female Today's Date: 08/23/2023  END OF SESSION:  PT End of Session - 08/23/23 1243     Visit Number 6    Number of Visits 13    Date for PT Re-Evaluation 09/06/23    Authorization Type Medicare    Authorization Time Period 2x/week x 6 weeks ( recert needed at 7/2), PN needed at visit #10    PT Start Time 1245    PT Stop Time 1326    PT Time Calculation (min) 41 min    Activity Tolerance Patient tolerated treatment well    Behavior During Therapy WFL for tasks assessed/performed             Past Medical History:  Diagnosis Date   (HFpEF) heart failure with preserved ejection fraction (HCC)    Anxiety    Asthma    Depression    Fibromyalgia    GERD (gastroesophageal reflux disease)    Grade II diastolic dysfunction    H/O scarlet fever    as child   Heart murmur    s/p scarlet fever as child   HLD (hyperlipidemia)    HTN (hypertension)    IBS (irritable bowel syndrome)    Low bone density    Mitral regurgitation    OA (osteoarthritis)    Osteopenia    Palpitations    PHN (postherpetic neuralgia)    PSVT (paroxysmal supraventricular tachycardia) (HCC)    Recurrent syncope    Rosacea    Seizure (HCC) 1978   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   Squamous cell carcinoma of skin 12/15/2021   R spinal mid lower back, EDC   Wears hearing aid in both ears    Past Surgical History:  Procedure Laterality Date   ABDOMINAL HYSTERECTOMY  2009   Total   BREAST EXCISIONAL BIOPSY Left 1979   NEG   COLONOSCOPY  [id number]   repeat 10 years   COLONOSCOPY WITH PROPOFOL  N/A 03/24/2020   Procedure: COLONOSCOPY WITH PROPOFOL ;  Surgeon: Shane Darling, MD;  Location: [facility];  Service: Endoscopy;  Laterality: N/A;   ESOPHAGOGASTRODUODENOSCOPY (EGD) WITH PROPOFOL  N/A 03/24/2020   Procedure: ESOPHAGOGASTRODUODENOSCOPY (EGD)  WITH PROPOFOL ;  Surgeon: Shane Darling, MD;  Location: [facility];  Service: Endoscopy;  Laterality: N/A;   EXCISION MORTON'S NEUROMA Left 09/21/2017   Procedure: EXCISION MORTON'S NEUROMA;  Surgeon: Sharlyn Deaner, DPM;  Location: [facility];  Service: Podiatry;  Laterality: Left;   FOOT SURGERY Right 1990   HAND SURGERY Right    KNEE ARTHROPLASTY Right 12/14/2020   Procedure: COMPUTER ASSISTED TOTAL KNEE ARTHROPLASTY;  Surgeon: Arlyne Lame, MD;  Location: [facility];  Service: Orthopedics;  Laterality: Right;   KNEE ARTHROSCOPY Right 10/14/2020   Procedure: ARTHROSCOPY KNEE, LATERAL MENISECTOMY;  Surgeon: Arlyne Lame, MD;  Location: [facility];  Service: Orthopedics;  Laterality: Right;   LAPAROSCOPIC OOPHERECTOMY     MINOR HARDWARE REMOVAL Left 11/22/2018   Procedure: REMOVAL SCREW;DEEP 2nd and 3RD LEFT CAPSULE RELEASE OF THIRD METATARSAL PHALANGEAL JOINT LEFT, TENDON LENGTHING THIRD TOE LEFT AND CORTISONE INJECTION LEFT;  Surgeon: Sharlyn Deaner, DPM;  Location: [facility];  Service: Podiatry;  Laterality: Left;  LMA OR IVA LOCAL   TONSILLECTOMY     TONSILLECTOMY     WEIL OSTEOTOMY Left 09/21/2017   Procedure: WEIL OSTEOTOMY-2ND & 3RD TOES;  Surgeon: Sharlyn Deaner, DPM;  Location: [facility];  Service: Podiatry;  Laterality: Left;  LMA WITH LOCAL MINI MONSTER DR Ulanda Gambles WANTS TED HOSE ON PATIENT'S RIGHT LEG   Patient Active Problem List   Diagnosis Date Noted   Nonrheumatic mitral valve regurgitation 12/18/2021   Essential hypertension 12/18/2021   Total knee replacement status 12/14/2020   Claudication in peripheral vascular disease (HCC) 10/04/2020   Chronic heart failure with preserved ejection fraction (HFpEF) (HCC) 12/17/2019   Chronic cough 10/15/2019   Bronchitis 10/15/2019   Hyperlipidemia LDL goal <100 07/22/2019   Paroxysmal SVT (supraventricular tachycardia) (HCC) 06/13/2018   Near syncope 09/11/2017   Asthma 03/23/2017    Fibromyalgia 03/10/2016   Sensorineural hearing loss (SNHL) of left ear with restricted hearing of right ear 12/29/2015   Post herpetic neuralgia 11/12/2015   Gastro-esophageal reflux disease without esophagitis 09/21/2015   Depression 06/29/2015   Major depressive disorder, single episode 06/29/2015   Osteopenia    Reflux    Rosacea     PCP: Dr. Primus Brookes, MD  REFERRING PROVIDER: Dr. Aubry Blase, MD  REFERRING DIAG:  [phone] (ICD-10-CM) - Presence of right artificial knee joint  M70.51 (ICD-10-CM) - Other bursitis of knee, right knee  M70.52 (ICD-10-CM) - Other bursitis of knee, left knee  M70.62 (ICD-10-CM) - Trochanteric bursitis, left hip    THERAPY DIAG:  Primary osteoarthritis of left knee  Muscle weakness (generalized)  Difficulty in walking, not elsewhere classified  Chronic pain of right knee  Rationale for Evaluation and Treatment: Rehabilitation  ONSET DATE: chronic  SUBJECTIVE:   SUBJECTIVE STATEMENT: Pt recently returned to [place] after a trip to [place].  She used the trekking pole while traveling in [place] x 2 weeks.  She was very active while traveling- walking up steep inclines/castles/forts/longer distances.  Did a LOT of walking, a lot of steps, walking on cobblestone streets, most days >10,000 steps.     C/o: R knee pain worsened when she returned to [place]; and L knee pain worsened too, not as significantly as R.  Has had a flare up of her fibromyalgia.  She is also dealing with a chronic cough.    She saw Dr. Hooten when she returned back to [facility] because she was concerned about the pain in her R knee (which is her knee that has TKA).  She reports having imaging of R knee which showed her TKA is stable.   Overall her sx are slowly starting to improve since she saw Dr. Hooten a few weeks ago.  PERTINENT HISTORY: Had a course of PT that ended in March 2025 Saw Orthopedics before [place]- she had an injection in L hip gr trochanter- this is doing well She also had L  knee injection before her previous course of PT and responded well to that tx  PAIN:  Are you having pain? Yes: 5/10 currently; at worst 9/10, 2/10 best   PRECAUTIONS: none  RED FLAGS: None   WEIGHT BEARING RESTRICTIONS: No  FALLS:  Has patient fallen in last 6 months? No  LIVING ENVIRONMENT: Lives with: lives with their family Lives in: House/apartment Stairs: 1 flight inside (bedroom is on first floor) Has following equipment at home: trekking poles  OCCUPATION: retired  PLOF: Independent  PATIENT GOALS: to improve her knee/LE pain, to be able to navigate the stairs in her home comfortably and also the stairs at her family's home when she travels to visit them in a few weeks  NEXT MD VISIT: none reported  OBJECTIVE:  Note: Objective measures were completed at Evaluation  unless otherwise noted.  DIAGNOSTIC FINDINGS: x-rays (see chart)  PATIENT SURVEYS:  LEFS  Extreme difficulty/unable (0), Quite a bit of difficulty (1), Moderate difficulty (2), Little difficulty (3), No difficulty (4) Survey date:  07/24/23  Any of your usual work, housework or school activities 1  2. Usual hobbies, recreational or sporting activities 1  3. Getting into/out of the bath 3  4. Walking between rooms 3  5. Putting on socks/shoes 3  6. Squatting  0  7. Lifting an object, like a bag of groceries from the floor 2  8. Performing light activities around your home 0  9. Performing heavy activities around your home 2  10. Getting into/out of a car 4  11. Walking 2 blocks 0  12. Walking 1 mile 0  13. Going up/down 10 stairs (1 flight) 0  14. Standing for 1 hour 2  15.  sitting for 1 hour 2  16. Running on even ground 0  17. Running on uneven ground 0  18. Making sharp turns while running fast 0  19. Hopping  0  20. Rolling over in bed 2  Score total:  25/80     COGNITION: Overall cognitive status: Within functional limits for tasks assessed     SENSATION: WFL  MUSCLE  LENGTH: Normal Hamstring mobility b/l with SLR  PALPATION: Pt reports significant tenderness with palpation along R and L pes anserine, L distal ITB, b/l quadriceps tendon,   LOWER EXTREMITY ROM:  Active ROM Right eval Left eval  Hip flexion    Hip extension    Hip abduction    Hip adduction    Hip internal rotation    Hip external rotation    Knee flexion 130 135  Knee extension 0 0  Ankle dorsiflexion    Ankle plantarflexion    Ankle inversion    Ankle eversion     (Blank rows = not tested)  LOWER EXTREMITY MMT:  MMT Right eval Left eval  Hip flexion 4- 4-  Hip extension 4- 4-  Hip abduction 4 4  Hip adduction    Hip internal rotation    Hip external rotation    Knee flexion 4 4  Knee extension 4 4  Ankle dorsiflexion    Ankle plantarflexion    Ankle inversion    Ankle eversion     (Blank rows = not tested)   FUNCTIONAL TESTS:  5 times sit to stand: to be administered at next visit Single leg balance: 5 seconds R/L  GAIT: Pt amb without any AD in clinic today; decreased knee flexion noted during swing phase b/l, (+) antalgic gait R LE.                                                                                                                               TREATMENT DATE: 08/23/23   Subjective: She continues to feel as though PT is not helping with her pain in her  knees and requesting to transition to HEP/chair yoga while awaiting PCP appointment on 7/3 in hopes for further testing and lab draws. She believes she potentially has heavy metal toxicity from her childhood that is affecting presently.    Provided patient with HEP with focus on strengthening with bands and dumbbells as patient requesting exercises for upper and lower body strength.    Access Code: [email or web address] URL: [email or web address] Date: 08/23/2023 Prepared by: Janine Melbourne  Exercises - Sit to Stand Without Arm Support  - 2-3 x daily - 5-7 x weekly - 3 sets - 10 reps -  Seated March  - 2-3 x daily - 5-7 x weekly - 3 sets - 10 reps - Seated Long Arc Quad  - 2-3 x daily - 5-7 x weekly - 3 sets - 10 reps - Seated Hip Abduction with Resistance  - 2-3 x daily - 5-7 x weekly - 3 sets - 10 reps - Seated Hip Adduction Isometrics with Ball  - 2-3 x daily - 5-7 x weekly - 3 sets - 10 reps - Standing March with Counter Support  - 2-3 x daily - 5-7 x weekly - 3 sets - 10 reps - Standing Hip Abduction with Counter Support  - 2-3 x daily - 5-7 x weekly - 3 sets - 10 reps - Heel Raises with Counter Support  - 2-3 x daily - 5-7 x weekly - 3 sets - 10 reps - Mini Squat with Counter Support  - 2-3 x daily - 5-7 x weekly - 3 sets - 10 reps - Shoulder External Rotation and Scapular Retraction with Resistance  - 2-3 x daily - 5-7 x weekly - 3 sets - 10 reps - Shoulder extension with resistance - Neutral  - 2-3 x daily - 5-7 x weekly - 3 sets - 10 reps - Standing Shoulder Row with Anchored Resistance  - 2-3 x daily - 5-7 x weekly - 3 sets - 10 reps - Standing Shoulder Horizontal Abduction with Resistance  - 2-3 x daily - 5-7 x weekly - 3 sets - 10 reps - Seated Bicep Curls Supinated with Dumbbells  - 2-3 x daily - 5-7 x weekly - 3 sets - 10 reps     PATIENT EDUCATION:  Education details: Discussed PT POC/goals, importance of activity pacing with her daily tasks for sx management right now as sx are mod/major irritability stage right now (5-9/10 pain and takes hours to improve); HEP instruction- see below Person educated: Patient Education method: Explanation, Demonstration, Tactile cues, Verbal cues, and Handouts Education comprehension: verbalized understanding and returned demonstration  HOME EXERCISE PROGRAM: Access Code: [email or web address] URL: [email or web address] Date: 07/24/2023 Prepared by: Lucrecia Sables  Exercises - Seated Heel Slide  - 2 x daily - 7 x weekly - 2 sets - 10 reps - Seated Calf Stretch with Strap  - 2 x daily - 7 x weekly - 3 sets - 30 seconds  hold  Access Code: [email or web address] URL: [email or web address] Date: 08/23/2023 Prepared by: Janine Melbourne  Exercises - Sit to Stand Without Arm Support  - 2-3 x daily - 5-7 x weekly - 3 sets - 10 reps - Seated March  - 2-3 x daily - 5-7 x weekly - 3 sets - 10 reps - Seated Long Arc Quad  - 2-3 x daily - 5-7 x weekly - 3 sets - 10 reps - Seated Hip Abduction with Resistance  - 2-3 x daily - 5-7 x weekly - 3 sets - 10 reps - Seated Hip  Adduction Isometrics with Ball  - 2-3 x daily - 5-7 x weekly - 3 sets - 10 reps - Standing March with Counter Support  - 2-3 x daily - 5-7 x weekly - 3 sets - 10 reps - Standing Hip Abduction with Counter Support  - 2-3 x daily - 5-7 x weekly - 3 sets - 10 reps - Heel Raises with Counter Support  - 2-3 x daily - 5-7 x weekly - 3 sets - 10 reps - Mini Squat with Counter Support  - 2-3 x daily - 5-7 x weekly - 3 sets - 10 reps - Shoulder External Rotation and Scapular Retraction with Resistance  - 2-3 x daily - 5-7 x weekly - 3 sets - 10 reps - Shoulder extension with resistance - Neutral  - 2-3 x daily - 5-7 x weekly - 3 sets - 10 reps - Standing Shoulder Row with Anchored Resistance  - 2-3 x daily - 5-7 x weekly - 3 sets - 10 reps - Standing Shoulder Horizontal Abduction with Resistance  - 2-3 x daily - 5-7 x weekly - 3 sets - 10 reps - Seated Bicep Curls Supinated with Dumbbells  - 2-3 x daily - 5-7 x weekly - 3 sets - 10 reps    ASSESSMENT:  CLINICAL IMPRESSION:  Patient requesting to self discharge from PT at this time 2/2 wanting further workup for reasoning behind constant fatigue, weakness, and pain. Pain has been consistent in her knees. Awaiting her PCP appointment in July to inquire about further lab work on potential heavy metal toxicity (per patient, potentially from her childhood). Session focused on supplying patient with updated HEP with focus on upper and lower body strengthening in sitting and standing. Patient also stating she is going  to participate in chair yoga at home. Patient and PT in agreement with discharge at this time until patient receives further workup and patient will reach out if needing to restart PT.   OBJECTIVE IMPAIRMENTS: Abnormal gait, decreased activity tolerance, decreased balance, difficulty walking, decreased ROM, decreased strength, impaired perceived functional ability, and pain.   ACTIVITY LIMITATIONS: carrying, lifting, bending, sitting, standing, squatting, stairs, transfers, and locomotion level  PARTICIPATION LIMITATIONS: meal prep, cleaning, laundry, interpersonal relationship, driving, shopping, community activity, yard work, and church  PERSONAL FACTORS: Age, Past/current experiences, and Time since onset of injury/illness/exacerbation are also affecting patient's functional outcome.   REHAB POTENTIAL: Excellent  CLINICAL DECISION MAKING: Stable/uncomplicated  EVALUATION COMPLEXITY: Low   GOALS: Goals reviewed with patient? Yes  SHORT TERM GOALS: Target date: 6//25 Pt will demonstrate ability to perform HEP for LE strength with PT verbal/tactile cues needed <50% of the time Baseline: initiated HEP today Goal status: INITIAL    LONG TERM GOALS: Target date: 09/06/23  Improve LEFS >18 (9 MDC) indicating pt is able to perform her daily activities including ascending/descending stairs without being as limited by b/l knee pain Baseline: 25/80 Goal status: INITIAL  2.  Improve LE strength >1/2 MMT grade b/l to facilitate improved ability to stand and walk >2 blocks to/from her church which she was able to do 6 months ago Baseline: unable to amb 2 blocks, 4-/5 hip flex, 4/5 knee ext Goal status: INITIAL  3.  Pt will be able to perform standing activities (cooking, laundry, cleaning) x 1 hr with <3/10 knee pain Baseline: 5-9/10 knee pain, limited to 10-15 min intervals Goal status: INITIAL   PLAN:  PT FREQUENCY: 2x/week  PT DURATION: 6 weeks  PLANNED INTERVENTIONS:  97110-Therapeutic  exercises, 97530- Therapeutic activity, [id number]- Neuromuscular re-education, [phone]- Self Care, and 60454- Manual therapy  PLAN FOR NEXT SESSION: continue progressing HEP; LE strengthening, continue with gradual progression of therapeutic exercises  Janine Melbourne, PT, DPT Physical Therapist - [facility]  Glenys Lapine, PT 08/23/2023, 12:43 PM

## 2023-08-23 ENCOUNTER — Ambulatory Visit

## 2023-08-23 DIAGNOSIS — M6281 Muscle weakness (generalized): Secondary | ICD-10-CM

## 2023-08-23 DIAGNOSIS — G8929 Other chronic pain: Secondary | ICD-10-CM

## 2023-08-23 DIAGNOSIS — R262 Difficulty in walking, not elsewhere classified: Secondary | ICD-10-CM

## 2023-08-23 DIAGNOSIS — M1712 Unilateral primary osteoarthritis, left knee: Secondary | ICD-10-CM | POA: Diagnosis not present

## 2023-08-28 ENCOUNTER — Ambulatory Visit: Attending: Student | Admitting: Student

## 2023-08-28 ENCOUNTER — Encounter: Payer: Self-pay | Admitting: Student

## 2023-08-28 VITALS — BP 120/74 | HR 61 | Ht 64.5 in | Wt 159.1 lb

## 2023-08-28 DIAGNOSIS — M79605 Pain in left leg: Secondary | ICD-10-CM

## 2023-08-28 DIAGNOSIS — I10 Essential (primary) hypertension: Secondary | ICD-10-CM | POA: Diagnosis not present

## 2023-08-28 DIAGNOSIS — R002 Palpitations: Secondary | ICD-10-CM | POA: Diagnosis not present

## 2023-08-28 DIAGNOSIS — E785 Hyperlipidemia, unspecified: Secondary | ICD-10-CM

## 2023-08-28 DIAGNOSIS — I5032 Chronic diastolic (congestive) heart failure: Secondary | ICD-10-CM | POA: Diagnosis not present

## 2023-08-28 DIAGNOSIS — I471 Supraventricular tachycardia, unspecified: Secondary | ICD-10-CM

## 2023-08-28 DIAGNOSIS — M79604 Pain in right leg: Secondary | ICD-10-CM

## 2023-08-28 DIAGNOSIS — R29898 Other symptoms and signs involving the musculoskeletal system: Secondary | ICD-10-CM

## 2023-08-28 NOTE — Patient Instructions (Signed)
 Medication Instructions:  Your physician recommends that you continue on your current medications as directed. Please refer to the Current Medication list given to you today.   *If you need a refill on your cardiac medications before your next appointment, please call your pharmacy*  Lab Work: Your provider would like for you to have following labs drawn today fasting lipid panel.   If you have labs (blood work) drawn today and your tests are completely normal, you will receive your results only by: MyChart Message (if you have MyChart) OR A paper copy in the mail If you have any lab test that is abnormal or we need to change your treatment, we will call you to review the results.  Testing/Procedures: No test ordered today   Follow-Up: At [facility], you and your health needs are our priority.  As part of our continuing mission to provide you with exceptional heart care, our providers are all part of one team.  This team includes your primary Cardiologist (physician) and Advanced Practice Providers or APPs (Physician Assistants and Nurse Practitioners) who all work together to provide you with the care you need, when you need it.  Your next appointment:   6 month(s)  Provider:   You may see Lonni Hanson, MD or one of the following Advanced Practice Providers on your designated Care Team:   Lonni Meager, NP Lesley Maffucci, PA-C Bernardino Bring, PA-C Cadence [place], PA-C Tylene Lunch, NP Barnie Hila, NP    We recommend signing up for the patient portal called MyChart.  Sign up information is provided on this After Visit Summary.  MyChart is used to connect with patients for Virtual Visits (Telemedicine).  Patients are able to view lab/test results, encounter notes, upcoming appointments, etc.  Non-urgent messages can be sent to your provider as well.   To learn more about what you can do with MyChart, go to [email or web address].

## 2023-08-29 ENCOUNTER — Ambulatory Visit: Payer: Self-pay | Admitting: Student

## 2023-08-29 LAB — LIPID PANEL
Chol/HDL Ratio: 3.5 ratio (ref 0.0–4.4)
Cholesterol, Total: 216 mg/dL — ABNORMAL HIGH (ref 100–199)
HDL: 62 mg/dL (ref >39)
LDL Chol Calc (NIH): 137 mg/dL — ABNORMAL HIGH (ref 0–99)
Triglycerides: 98 mg/dL (ref 0–149)
VLDL Cholesterol Cal: 17 mg/dL (ref 5–40)

## 2023-09-04 ENCOUNTER — Encounter: Payer: Self-pay | Admitting: Emergency Medicine

## 2023-09-04 NOTE — Progress Notes (Signed)
 Letter sent.

## 2023-09-07 DIAGNOSIS — J4520 Mild intermittent asthma, uncomplicated: Secondary | ICD-10-CM | POA: Diagnosis not present

## 2023-09-07 DIAGNOSIS — K219 Gastro-esophageal reflux disease without esophagitis: Secondary | ICD-10-CM | POA: Diagnosis not present

## 2023-09-07 DIAGNOSIS — M2550 Pain in unspecified joint: Secondary | ICD-10-CM | POA: Diagnosis not present

## 2023-09-07 DIAGNOSIS — Z79899 Other long term (current) drug therapy: Secondary | ICD-10-CM | POA: Diagnosis not present

## 2023-09-07 DIAGNOSIS — R739 Hyperglycemia, unspecified: Secondary | ICD-10-CM | POA: Diagnosis not present

## 2023-09-07 DIAGNOSIS — M797 Fibromyalgia: Secondary | ICD-10-CM | POA: Diagnosis not present

## 2023-09-07 DIAGNOSIS — I5032 Chronic diastolic (congestive) heart failure: Secondary | ICD-10-CM | POA: Diagnosis not present

## 2023-09-07 DIAGNOSIS — Z1159 Encounter for screening for other viral diseases: Secondary | ICD-10-CM | POA: Diagnosis not present

## 2023-09-07 DIAGNOSIS — M79641 Pain in right hand: Secondary | ICD-10-CM | POA: Diagnosis not present

## 2023-09-07 DIAGNOSIS — F411 Generalized anxiety disorder: Secondary | ICD-10-CM | POA: Diagnosis not present

## 2023-09-14 DIAGNOSIS — S60454A Superficial foreign body of right ring finger, initial encounter: Secondary | ICD-10-CM | POA: Diagnosis not present

## 2023-10-09 DIAGNOSIS — R053 Chronic cough: Secondary | ICD-10-CM | POA: Diagnosis not present

## 2023-10-09 DIAGNOSIS — J310 Chronic rhinitis: Secondary | ICD-10-CM | POA: Diagnosis not present

## 2023-10-26 DIAGNOSIS — J069 Acute upper respiratory infection, unspecified: Secondary | ICD-10-CM | POA: Diagnosis not present

## 2023-12-01 ENCOUNTER — Encounter (INDEPENDENT_AMBULATORY_CARE_PROVIDER_SITE_OTHER): Payer: Self-pay

## 2023-12-06 DIAGNOSIS — Z79899 Other long term (current) drug therapy: Secondary | ICD-10-CM | POA: Diagnosis not present

## 2023-12-06 DIAGNOSIS — M2550 Pain in unspecified joint: Secondary | ICD-10-CM | POA: Diagnosis not present

## 2023-12-06 DIAGNOSIS — Z1159 Encounter for screening for other viral diseases: Secondary | ICD-10-CM | POA: Diagnosis not present

## 2023-12-06 DIAGNOSIS — R739 Hyperglycemia, unspecified: Secondary | ICD-10-CM | POA: Diagnosis not present

## 2023-12-13 ENCOUNTER — Other Ambulatory Visit: Payer: Self-pay | Admitting: Internal Medicine

## 2023-12-13 DIAGNOSIS — Z0000 Encounter for general adult medical examination without abnormal findings: Secondary | ICD-10-CM | POA: Diagnosis not present

## 2023-12-13 DIAGNOSIS — M5441 Lumbago with sciatica, right side: Secondary | ICD-10-CM | POA: Diagnosis not present

## 2023-12-13 DIAGNOSIS — Z1231 Encounter for screening mammogram for malignant neoplasm of breast: Secondary | ICD-10-CM | POA: Diagnosis not present

## 2023-12-13 DIAGNOSIS — M5442 Lumbago with sciatica, left side: Secondary | ICD-10-CM | POA: Diagnosis not present

## 2023-12-13 DIAGNOSIS — M797 Fibromyalgia: Secondary | ICD-10-CM | POA: Diagnosis not present

## 2023-12-13 DIAGNOSIS — Z23 Encounter for immunization: Secondary | ICD-10-CM | POA: Diagnosis not present

## 2023-12-13 DIAGNOSIS — J4520 Mild intermittent asthma, uncomplicated: Secondary | ICD-10-CM | POA: Diagnosis not present

## 2023-12-13 DIAGNOSIS — M2550 Pain in unspecified joint: Secondary | ICD-10-CM | POA: Diagnosis not present

## 2023-12-13 DIAGNOSIS — K219 Gastro-esophageal reflux disease without esophagitis: Secondary | ICD-10-CM | POA: Diagnosis not present

## 2023-12-13 DIAGNOSIS — G8929 Other chronic pain: Secondary | ICD-10-CM | POA: Diagnosis not present

## 2023-12-13 DIAGNOSIS — Z1331 Encounter for screening for depression: Secondary | ICD-10-CM | POA: Diagnosis not present

## 2023-12-13 DIAGNOSIS — M5416 Radiculopathy, lumbar region: Secondary | ICD-10-CM | POA: Diagnosis not present

## 2023-12-13 DIAGNOSIS — M48062 Spinal stenosis, lumbar region with neurogenic claudication: Secondary | ICD-10-CM | POA: Diagnosis not present

## 2023-12-13 DIAGNOSIS — F411 Generalized anxiety disorder: Secondary | ICD-10-CM | POA: Diagnosis not present

## 2023-12-14 ENCOUNTER — Other Ambulatory Visit: Payer: Self-pay | Admitting: Internal Medicine

## 2023-12-14 DIAGNOSIS — G8929 Other chronic pain: Secondary | ICD-10-CM

## 2023-12-14 DIAGNOSIS — M5416 Radiculopathy, lumbar region: Secondary | ICD-10-CM

## 2023-12-18 ENCOUNTER — Ambulatory Visit
Admission: RE | Admit: 2023-12-18 | Discharge: 2023-12-18 | Disposition: A | Discharge status: Home / Self Care | Source: Ambulatory Visit | Attending: Internal Medicine | Admitting: Internal Medicine

## 2023-12-18 DIAGNOSIS — M5416 Radiculopathy, lumbar region: Secondary | ICD-10-CM | POA: Diagnosis present

## 2023-12-18 DIAGNOSIS — G8929 Other chronic pain: Secondary | ICD-10-CM | POA: Diagnosis present

## 2023-12-18 DIAGNOSIS — M5441 Lumbago with sciatica, right side: Secondary | ICD-10-CM | POA: Diagnosis present

## 2023-12-18 DIAGNOSIS — M5442 Lumbago with sciatica, left side: Secondary | ICD-10-CM | POA: Diagnosis present

## 2024-01-01 DIAGNOSIS — M5416 Radiculopathy, lumbar region: Secondary | ICD-10-CM | POA: Diagnosis not present

## 2024-01-03 ENCOUNTER — Other Ambulatory Visit: Payer: Self-pay | Admitting: Student

## 2024-01-03 DIAGNOSIS — I471 Supraventricular tachycardia, unspecified: Secondary | ICD-10-CM

## 2024-01-04 ENCOUNTER — Ambulatory Visit: Admitting: Dermatology

## 2024-01-04 ENCOUNTER — Encounter: Payer: Self-pay | Admitting: Dermatology

## 2024-01-04 DIAGNOSIS — L821 Other seborrheic keratosis: Secondary | ICD-10-CM

## 2024-01-04 DIAGNOSIS — L578 Other skin changes due to chronic exposure to nonionizing radiation: Secondary | ICD-10-CM

## 2024-01-04 DIAGNOSIS — D1801 Hemangioma of skin and subcutaneous tissue: Secondary | ICD-10-CM

## 2024-01-04 DIAGNOSIS — L820 Inflamed seborrheic keratosis: Secondary | ICD-10-CM | POA: Diagnosis not present

## 2024-01-04 DIAGNOSIS — W908XXA Exposure to other nonionizing radiation, initial encounter: Secondary | ICD-10-CM

## 2024-01-04 NOTE — Progress Notes (Signed)
   Follow-Up Visit   Subjective  Andrea Savage is a 78 y.o. female who presents for the following: check spot L arm, was a blister but healing, check spot R arm itchy, dry itchy spot back   The following portions of the chart were reviewed this encounter and updated as appropriate: medications, allergies, medical history  Review of Systems:  No other skin or systemic complaints except as noted in HPI or Assessment and Plan.  Objective  Well appearing patient in no apparent distress; mood and affect are within normal limits.   A focused examination was performed of the following areas: Arms, back, face  Relevant exam findings are noted in the Assessment and Plan.  L forearm x 2, R forearm x 1, L mid back x 2, Spinal upper back x 7 (12) Stuck on waxy papules with erythema  Assessment & Plan   SEBORRHEIC KERATOSIS - Stuck-on, waxy, tan-brown papules and/or plaques  - Benign-appearing - Discussed benign etiology and prognosis. - Observe - Call for any changes  HEMANGIOMAS arms Exam: red papule(s) Discussed benign nature. Recommend observation. Call for changes.   ACTINIC DAMAGE neck - chronic, secondary to cumulative UV radiation exposure/sun exposure over time - diffuse scaly erythematous macules with underlying dyspigmentation - Recommend daily broad spectrum sunscreen SPF 30+ to sun-exposed areas, reapply every 2 hours as needed.  - Recommend staying in the shade or wearing long sleeves, sun glasses (UVA+UVB protection) and wide brim hats (4-inch brim around the entire circumference of the hat). - Call for new or changing lesions.   INFLAMED SEBORRHEIC KERATOSIS (12) L forearm x 2, R forearm x 1, L mid back x 2, Spinal upper back x 7 (12) Symptomatic, irritating, patient would like treated. Destruction of lesion - L forearm x 2, R forearm x 1, L mid back x 2, Spinal upper back x 7 (12)  Destruction method: cryotherapy   Informed consent: discussed and consent  obtained   Lesion destroyed using liquid nitrogen: Yes   Region frozen until ice ball extended beyond lesion: Yes   Outcome: patient tolerated procedure well with no complications   Post-procedure details: wound care instructions given   Additional details:  Prior to procedure, discussed risks of blister formation, small wound, skin dyspigmentation, or rare scar following cryotherapy. Recommend Vaseline ointment to treated areas while healing.    Return for as scheduled for TBSE, Hx of SCC, Hx of AKs.  I, Grayce Saunas, RMA, am acting as scribe for Rexene Rattler, MD .   Documentation: I have reviewed the above documentation for accuracy and completeness, and I agree with the above.  Rexene Rattler, MD

## 2024-01-04 NOTE — Patient Instructions (Addendum)

## 2024-01-08 DIAGNOSIS — M5416 Radiculopathy, lumbar region: Secondary | ICD-10-CM | POA: Diagnosis not present

## 2024-01-18 DIAGNOSIS — M5416 Radiculopathy, lumbar region: Secondary | ICD-10-CM | POA: Diagnosis not present

## 2024-01-18 DIAGNOSIS — M48062 Spinal stenosis, lumbar region with neurogenic claudication: Secondary | ICD-10-CM | POA: Diagnosis not present

## 2024-01-22 DIAGNOSIS — R053 Chronic cough: Secondary | ICD-10-CM | POA: Diagnosis not present

## 2024-01-22 DIAGNOSIS — J4520 Mild intermittent asthma, uncomplicated: Secondary | ICD-10-CM | POA: Diagnosis not present

## 2024-01-22 DIAGNOSIS — K219 Gastro-esophageal reflux disease without esophagitis: Secondary | ICD-10-CM | POA: Diagnosis not present

## 2024-01-26 ENCOUNTER — Telehealth: Payer: Self-pay | Admitting: Internal Medicine

## 2024-01-26 NOTE — Telephone Encounter (Signed)
 Called and spoke with the patient. She reported experiencing an episode yesterday of dizziness, shortness of breath, and elevated blood pressure measuring 156/86, with a heart rate of 69. The episode lasted several hours. She denies any symptoms today. The patient is scheduled for a clinic visit on 01/29/24 and was advised to seek emergency care if symptoms develop or worsen.

## 2024-01-26 NOTE — Telephone Encounter (Signed)
 Pt c/o BP issue: STAT if pt c/o blurred vision, one-sided weakness or slurred speech.  STAT if BP is GREATER than 180/120 TODAY.  STAT if BP is LESS than 90/60 and SYMPTOMATIC TODAY  1. What is your BP concern? BP elevated   2. Have you taken any BP medication today?Yes  3. What are your last 5 BP readings? 156/86 151/82  4. Are you having any other symptoms (ex. Dizziness, headache, blurred vision, passed out)? Lightheaded

## 2024-01-26 NOTE — Progress Notes (Unsigned)
 Cardiology Clinic Note   Date: 01/29/2024 ID: Andrea, Savage March 13, 1945, MRN [id number]  Primary Cardiologist:  Lonni Hanson, MD  Chief Complaint   Andrea Savage is a 78 y.o. female who presents to the clinic today for evaluation of dizziness.   Patient Profile   Andrea Savage is followed by Dr. Hanson for the history outlined below.      Past medical history significant for: Chronic diastolic heart failure. Echo 03/09/2023: EF 60-65%. No RWMA. Grade I DD. Normal RV size/function. No significant valvular abnormalities.  Hypertension. Hyperlipidemia. Lipid panel 12/06/2023: LDL 176, HDL 66, TG 132, total 268. PSVT. 14-day ZIO 10/24/2017: HR 53 to 135 bpm, average 76 bpm.  Rare PACs/PVCs.  33 atrial runs with max rate 203 bpm, longest lasting 17.3 seconds.  No sustained arrhythmia or prolonged pauses identified.  Triggered events corresponded to sinus rhythm, PACs, and atrial runs. GERD. Fibromyalgia.  In summary, patient was first evaluated by Dr. Hanson on 09/13/2017 for near syncope/syncope and shortness of breath.  3 days prior to her visit she had been evaluated in the emergency department for shortness of breath felt to be anxiety.  She was pending foot surgery.  Nuclear stress testing for risk stratification was a normal low risk study.  Echo showed normal LV function with mild MR.  14-day ZIO showed runs of SVT as detailed above.   Patient was seen in the clinic on 02/20/2023 for routine follow up. She reported left flank/lower chest pain under left breast occurring randomly lasting up to 10 minutes and resolving on its own. Pain typically not related to exertion. She endorsed increased DOE. She also reported occasional, brief palpitations typically triggered by chronic cough. Echo was updated which showed normal LV/RV function as detailed above.    Patient was last seen in the office by me on 08/28/2023 for routine follow-up.  She had recently returned from a trip  to [place] where her chronic cough completely resolved until she returned to [facility].  She was pending allergy testing at [facility].  She did a lot of walking during her trip despite chronic knee pain.  She reported since returning she noticed increased weakness in bilateral legs with decreased tolerance for standing and walking.  She had completed physical therapy for her knee and was planning to begin seated exercises at home.  She also noticed mild edema in bilateral ankles.  She stated BP was trending up with SBP as high as 170.  She was not checking BP at home.  She was normotensive at the time of her visit.  She was not interested in changing or adding medications for BP.  She agreed to start checking BP at home with a wrist cuff and technique was discussed.  She had stopped Zetia  too months prior secondary to cost.  Repeat lipid panel demonstrated increased LDL to 137 and it was recommended she restart Zetia .  Patient contacted the office to report elevated BP with associated lightheadedness.  Per triage RN: Called and spoke with the patient. She reported experiencing an episode yesterday of dizziness, shortness of breath, and elevated blood pressure measuring 156/86, with a heart rate of 69. The episode lasted several hours. She denies any symptoms today. The patient is scheduled for a clinic visit on 01/29/24 and was advised to seek emergency care if symptoms develop or worsen.     History of Present Illness    Today, patient is here alone. She reports episode of dizziness as described above.  She states she was in her usual state of health prior to episodes. Usual state of pain as patient has chronic fibromyalgia that is worsened by recent start of Lyrica (4 months ago) for chronic cough. Cough has improved. That day of episode she was busy prepping for the holiday in the morning then went to lunch. At lunch she ate a bunch of fried chicken livers and drank several cups of cough. She took a nap when she got  home. Upon waking from her nap she went to the kitchen to start baking when she was hit with dizziness like I was going to go out and dyspnea. She has not had another episode since. Patient denies dyspnea on exertion, orthopnea or PND. She gets occasional lower extremity edema. No Lasix  for at least 2 months. No chest pain, pressure, or tightness. No palpitations. BP has been as low as 120s systolic. Her activity is limited by chronic pain. She tries to do chair exercises and stretching but is inconsistent.     ROS: All other systems reviewed and are otherwise negative except as noted in History of Present Illness.  EKGs/Labs Reviewed    EKG Interpretation Date/Time:  Monday January 29 2024 10:24:12 EST Ventricular Rate:  71 PR Interval:  142 QRS Duration:  76 QT Interval:  412 QTC Calculation: 447 R Axis:   42  Text Interpretation: Sinus rhythm with occasional Premature ventricular complexes When compared with ECG of 28-Aug-2023 09:21, Premature ventricular complexes are now Present ST no longer elevated in Lateral leads Confirmed by Loistine Sober [phone]) on 01/29/2024 10:27:52 AM   Labs from outside facility reviewed 12/06/2023: TSH 2.666, A1c 5.5, sodium 140, potassium 4.4, creatinine 0.7, BUN 15, eGFR 89, Hemoglobin 13.5, WBC 4.1, platelets 203.  Physical Exam    VS:  BP 130/65 (BP Location: Left Arm, Patient Position: Sitting, Cuff Size: Normal)   Pulse 71 Comment: 67 oximeter  Ht 5' 5 (1.651 m)   Wt 164 lb 6.4 oz (74.6 kg)   SpO2 98%   BMI 27.36 kg/m  , BMI Body mass index is 27.36 kg/m.  GEN: Well nourished, well developed, in no acute distress. Neck: No JVD or carotid bruits. Cardiac:  RRR.  No murmur. No rubs or gallops.   Respiratory:  Respirations regular and unlabored. Clear to auscultation without rales, wheezing or rhonchi. GI: Soft, nontender, nondistended. Extremities: Radials/DP/PT 2+ and equal bilaterally. No clubbing or cyanosis. No edema   Skin: Warm  and dry, no rash. Neuro: Strength intact.  Assessment & Plan   Chronic diastolic heart failure Echo January 2025 showed normal LV/RV function with grade I DD and no significant valvular abnormalities. Patient denies DOE, orthopnea or PND.  She gets occasional lower extremity edema. She has not needed Lasix  for at least 2 months. Euvolemic and well compensated on exam.  - Continue Toprol , Lasix  as needed.   Palpitations/PSVT/dizziness 14 day Zio August 2019 showed 33 atrial runs. She denies palpitations. She had one episode of dizziness after having a busy morning, eating fried chicken livers, and drinking several cups of coffee. No dizziness or presyncope since.  EKG demonstrates NSR with occasional PVCs. Extrasystole on exam today.  - Continue Toprol .  - 7 day Zio.  - Instructed to contact the office if further episodes of dizziness or presyncope. Would consider carotid US  and follow up visit at that point.    Hypertension BP today 130/65. She reports BP can be as low as 120s systolic but she will on occasion  get readings in the 140-150s. Will not make any changes today. Would consider changing Toprol  to carvedilol if BP consistently >140. She is encouraged to bring home BP (wrist) cuff to next office visit (here or at PCP) to ensure accuracy.    - Continue Toprol    Hyperlipidemia LDL 176 October 2025, not at goal. She has not been taking Zetia  for quite some time secondary to not having refills and it dropped of her medication list at the pharmacy. She has tried statins in the past and believes they caused myopathy. She is not interested in going on a statin at this point.  - Restart Zetia  10 mg daily.   Disposition: 7 day Zio. Return in 6 months or sooner as needed.          Signed, Barnie HERO. Quynn Vilchis, DNP, NP-C

## 2024-01-29 ENCOUNTER — Ambulatory Visit: Attending: Student | Admitting: Student

## 2024-01-29 ENCOUNTER — Ambulatory Visit

## 2024-01-29 ENCOUNTER — Encounter: Payer: Self-pay | Admitting: Student

## 2024-01-29 VITALS — BP 130/65 | HR 71 | Ht 65.0 in | Wt 164.4 lb

## 2024-01-29 DIAGNOSIS — I493 Ventricular premature depolarization: Secondary | ICD-10-CM

## 2024-01-29 DIAGNOSIS — I5032 Chronic diastolic (congestive) heart failure: Secondary | ICD-10-CM

## 2024-01-29 DIAGNOSIS — I471 Supraventricular tachycardia, unspecified: Secondary | ICD-10-CM

## 2024-01-29 DIAGNOSIS — I10 Essential (primary) hypertension: Secondary | ICD-10-CM

## 2024-01-29 DIAGNOSIS — R42 Dizziness and giddiness: Secondary | ICD-10-CM | POA: Diagnosis not present

## 2024-01-29 DIAGNOSIS — E782 Mixed hyperlipidemia: Secondary | ICD-10-CM | POA: Diagnosis not present

## 2024-01-29 DIAGNOSIS — R002 Palpitations: Secondary | ICD-10-CM

## 2024-01-29 MED ORDER — EZETIMIBE 10 MG PO TABS: 10.0000 mg | ORAL_TABLET | Freq: Every day | ORAL | 3 refills | Status: AC

## 2024-01-29 NOTE — Patient Instructions (Signed)
 Medication Instructions:  Your physician recommends the following medication changes.  START TAKING: Zetia  10 mg daily  *If you need a refill on your cardiac medications before your next appointment, please call your pharmacy*  Lab Work: None ordered at this time   Testing/Procedures: ZIO XT- Long Term Monitor Instructions  Your physician has requested you wear a ZIO patch monitor for 7 days.  This is a single patch monitor. [employer] supplies one patch monitor per enrollment. Additional stickers are not available. Please do not apply patch if you will be having a Nuclear Stress Test, Echocardiogram, Cardiac CT, MRI, or Chest Xray during the period you would be wearing the monitor. The patch cannot be worn during these tests. You cannot remove and re-apply the ZIO XT patch monitor.  Your ZIO patch monitor will be mailed 3 day USPS to your address on file. It may take 3-5 days to receive your monitor after you have been enrolled. Once you have received your monitor, please review the enclosed instructions. Your monitor has already been registered assigning a specific monitor serial number to you.  Billing and Patient Assistance Program Information  We have supplied [employer] with any of your insurance information on file for billing purposes.  [employer] offers a sliding scale Patient Assistance Program for patients that do not have insurance, or whose insurance does not completely cover the cost of the ZIO monitor.  You must apply for the Patient Assistance Program to qualify for this discounted rate.  To apply, please call [employer] at [phone], select option 4, select option 2, ask to apply for Patient Assistance Program. Meredeth will ask your household income, and how many people are in your household. They will quote your out-of-pocket cost based on that information. [employer] will also be able to set up a 8-month, interest-free payment plan if needed.  Applying the monitor   Shave hair from  upper left chest.  Hold abrader disc by orange tab. Rub abrader in 40 strokes over the upper left chest as indicated in your monitor instructions.  Clean area with 4 enclosed alcohol  pads. Let dry.  Apply patch as indicated in monitor instructions. Patch will be placed under collarbone on left side of chest with arrow pointing upward.  Rub patch adhesive wings for 2 minutes. Remove white label marked 1. Remove the white label marked 2. Rub patch adhesive wings for 2 additional minutes.  While looking in a mirror, press and release button in center of patch. A small green light will flash 3-4 times. This will be your only indicator that the monitor has been turned on.  Do not shower for the first 24 hours. You may shower after the first 24 hours.  Press the button if you feel a symptom. You will hear a small click. Record Date, Time and Symptom in the Patient Logbook.  When you are ready to remove the patch, follow instructions on the last 2 pages of Patient Logbook.  Stick patch monitor into the tabs at the bottom of the return box.  Place Patient Logbook in the blue and white box. Use locking tab on box and tape box closed securely. The blue and white box has prepaid postage on it. Please place it in the mailbox as soon as possible. Your physician should have your test results approximately 7-14 days after the monitor has been mailed back to [facility].  Call [facility] Customer Care at [phone] if you have questions regarding your ZIO XT patch monitor.  Call them  immediately if you see an orange light blinking on your monitor.  If your monitor falls off in less than 4 days, contact our Monitor department at [phone].  If your monitor becomes loose or falls off after 4 days call [employer] at [phone] for suggestions on securing your monitor.   Follow-Up: At [facility], you and your health needs are our priority.  As part of our continuing mission to provide you  with exceptional heart care, our providers are all part of one team.  This team includes your primary Cardiologist (physician) and Advanced Practice Providers or APPs (Physician Assistants and Nurse Practitioners) who all work together to provide you with the care you need, when you need it.  Your next appointment:   6 month(s)  Provider:   You may see Lonni Hanson, MD or Cadence Franchester, PA-C   We recommend signing up for the patient portal called MyChart.  Sign up information is provided on this After Visit Summary.  MyChart is used to connect with patients for Virtual Visits (Telemedicine).  Patients are able to view lab/test results, encounter notes, upcoming appointments, etc.  Non-urgent messages can be sent to your provider as well.   To learn more about what you can do with MyChart, go to [email or web address].

## 2024-02-08 DIAGNOSIS — M5416 Radiculopathy, lumbar region: Secondary | ICD-10-CM | POA: Diagnosis not present

## 2024-02-08 DIAGNOSIS — R197 Diarrhea, unspecified: Secondary | ICD-10-CM | POA: Diagnosis not present

## 2024-02-08 DIAGNOSIS — M48062 Spinal stenosis, lumbar region with neurogenic claudication: Secondary | ICD-10-CM | POA: Diagnosis not present

## 2024-02-25 DIAGNOSIS — I471 Supraventricular tachycardia, unspecified: Secondary | ICD-10-CM

## 2024-02-26 ENCOUNTER — Ambulatory Visit: Payer: Self-pay | Admitting: Student

## 2024-03-10 ENCOUNTER — Emergency Department

## 2024-03-10 ENCOUNTER — Emergency Department
Admission: EM | Admit: 2024-03-10 | Discharge: 2024-03-10 | Disposition: A | Discharge status: Home / Self Care | Attending: Emergency Medicine | Admitting: Emergency Medicine

## 2024-03-10 DIAGNOSIS — D72829 Elevated white blood cell count, unspecified: Secondary | ICD-10-CM | POA: Insufficient documentation

## 2024-03-10 DIAGNOSIS — K529 Noninfective gastroenteritis and colitis, unspecified: Secondary | ICD-10-CM | POA: Insufficient documentation

## 2024-03-10 DIAGNOSIS — E871 Hypo-osmolality and hyponatremia: Secondary | ICD-10-CM | POA: Diagnosis not present

## 2024-03-10 DIAGNOSIS — J45909 Unspecified asthma, uncomplicated: Secondary | ICD-10-CM | POA: Diagnosis not present

## 2024-03-10 DIAGNOSIS — R197 Diarrhea, unspecified: Secondary | ICD-10-CM

## 2024-03-10 DIAGNOSIS — I10 Essential (primary) hypertension: Secondary | ICD-10-CM | POA: Insufficient documentation

## 2024-03-10 LAB — COMPREHENSIVE METABOLIC PANEL WITH GFR
ALT: 16 U/L (ref 0–44)
AST: 22 U/L (ref 15–41)
Albumin: 3.5 g/dL (ref 3.5–5.0)
Alkaline Phosphatase: 132 U/L — ABNORMAL HIGH (ref 38–126)
Anion gap: 13 (ref 5–15)
BUN: 6 mg/dL — ABNORMAL LOW (ref 8–23)
CO2: 24 mmol/L (ref 22–32)
Calcium: 9.7 mg/dL (ref 8.9–10.3)
Chloride: 96 mmol/L — ABNORMAL LOW (ref 98–111)
Creatinine, Ser: 0.7 mg/dL (ref 0.44–1.00)
GFR, Estimated: >60 mL/min
Glucose, Bld: 106 mg/dL — ABNORMAL HIGH (ref 70–99)
Potassium: 4.4 mmol/L (ref 3.5–5.1)
Sodium: 133 mmol/L — ABNORMAL LOW (ref 135–145)
Total Bilirubin: 0.5 mg/dL (ref 0.0–1.2)
Total Protein: 6.7 g/dL (ref 6.5–8.1)

## 2024-03-10 LAB — URINALYSIS, ROUTINE W REFLEX MICROSCOPIC
Bacteria, UA: NONE SEEN
Bilirubin Urine: NEGATIVE
Glucose, UA: NEGATIVE mg/dL
Hgb urine dipstick: NEGATIVE
Ketones, ur: NEGATIVE mg/dL
Nitrite: NEGATIVE
Protein, ur: NEGATIVE mg/dL
Specific Gravity, Urine: >1.046 — ABNORMAL HIGH (ref 1.005–1.030)
pH: 6 (ref 5.0–8.0)

## 2024-03-10 LAB — CBC
HCT: 36.2 % (ref 36.0–46.0)
Hemoglobin: 12.3 g/dL (ref 12.0–15.0)
MCH: 29.9 pg (ref 26.0–34.0)
MCHC: 34 g/dL (ref 30.0–36.0)
MCV: 88.1 fL (ref 80.0–100.0)
Platelets: 325 K/uL (ref 150–400)
RBC: 4.11 MIL/uL (ref 3.87–5.11)
RDW: 13.2 % (ref 11.5–15.5)
WBC: 14.4 K/uL — ABNORMAL HIGH (ref 4.0–10.5)
nRBC: 0 % (ref 0.0–0.2)

## 2024-03-10 LAB — LIPASE, BLOOD: Lipase: 15 U/L (ref 11–51)

## 2024-03-10 LAB — LACTIC ACID, PLASMA: Lactic Acid, Venous: 1 mmol/L (ref 0.5–1.9)

## 2024-03-10 MED ORDER — LOPERAMIDE HCL 2 MG PO CAPS
4.0000 mg | ORAL_CAPSULE | Freq: Once | ORAL | Status: AC
  Administered 2024-03-10: 4 mg via ORAL
  Filled 2024-03-10: qty 2

## 2024-03-10 MED ORDER — MORPHINE SULFATE (PF) 4 MG/ML IV SOLN
4.0000 mg | Freq: Once | INTRAVENOUS | Status: AC
  Administered 2024-03-10: 4 mg via INTRAVENOUS
  Filled 2024-03-10: qty 1

## 2024-03-10 MED ORDER — LOPERAMIDE HCL 2 MG PO TABS: 2.0000 mg | ORAL_TABLET | Freq: Four times a day (QID) | ORAL | 2 refills | Status: AC | PRN

## 2024-03-10 MED ORDER — AMOXICILLIN-POT CLAVULANATE 875-125 MG PO TABS: 1.0000 | ORAL_TABLET | Freq: Two times a day (BID) | ORAL | 0 refills | Status: DC

## 2024-03-10 MED ORDER — ONDANSETRON HCL 4 MG/2ML IJ SOLN
4.0000 mg | Freq: Once | INTRAMUSCULAR | Status: AC
  Administered 2024-03-10: 4 mg via INTRAVENOUS
  Filled 2024-03-10: qty 2

## 2024-03-10 MED ORDER — IOHEXOL 300 MG/ML  SOLN
100.0000 mL | Freq: Once | INTRAMUSCULAR | Status: AC | PRN
  Administered 2024-03-10: 100 mL via INTRAVENOUS

## 2024-03-10 MED ORDER — SODIUM CHLORIDE 0.9 % IV BOLUS
1000.0000 mL | Freq: Once | INTRAVENOUS | Status: AC
  Administered 2024-03-10: 1000 mL via INTRAVENOUS

## 2024-03-10 MED ORDER — AMOXICILLIN-POT CLAVULANATE 875-125 MG PO TABS
1.0000 | ORAL_TABLET | Freq: Once | ORAL | Status: AC
  Administered 2024-03-10: 1 via ORAL
  Filled 2024-03-10: qty 1

## 2024-03-10 NOTE — ED Triage Notes (Signed)
 Pt presents to the ED via [facility] from home. Pt has had diarrhea for 40 days. Reports that she had 4 episodes of diarrhea today. Reports feeling generalized weakness and dizziness.

## 2024-03-10 NOTE — ED Triage Notes (Signed)
 First Nurse Note:  Pt via [facility] from home. Pt c/o diarrhea since Thanksgiving, OTC medication has not been helped. Reports some dizziness and nausea. PCP did a stool sample before New Years and it was negative. Pt is A&Ox4 and NAD  EMS reports:  98.4 orally 146/72 BP, 129/85 when stand 81 HR  97% on RA 116 CBG

## 2024-03-10 NOTE — ED Provider Notes (Signed)
 "   [facility] Emergency Department Provider Note     Event Date/Time   First MD Initiated Contact with Patient 03/10/24 1536     (approximate)   History   Diarrhea  HPI  Andrea Savage is a 79 y.o. female with a past medical history of fibromyalgia, GERD, asthma, IBS, HLD, HTN presents to the ED for evaluation of chronic diarrhea ongoing for the past 40 days per patient.  Patient describes the diarrhea as watery and copious.  She reports she has 3 seconds before she will have an episode.  She reports she has seen multiple providers of this issue and has been prescribed Imodium  which helps but she now only has 1 pill remaining at home.  Associated symptoms includes abdominal pain, chills, subjective fevers and headache.  She denies any recent antibiotic use, recent travel and sick contacts.    Physical Exam   Triage Vital Signs: ED Triage Vitals  Encounter Vitals Group     BP 03/10/24 1353 (!) 148/80     Girls Systolic BP Percentile --      Girls Diastolic BP Percentile --      Boys Systolic BP Percentile --      Boys Diastolic BP Percentile --      Pulse Rate 03/10/24 1353 89     Resp 03/10/24 1353 18     Temp 03/10/24 1353 98.7 F (37.1 C)     Temp Source 03/10/24 1353 Oral     SpO2 03/10/24 1353 93 %     Weight 03/10/24 1355 160 lb (72.6 kg)     Height 03/10/24 1355 5' 5 (1.651 m)     Head Circumference --      Peak Flow --      Pain Score 03/10/24 1354 8     Pain Loc --      Pain Education --      Exclude from Growth Chart --     Most recent vital signs: Vitals:   03/10/24 1803 03/10/24 2149  BP: (!) 147/77 128/60  Pulse: 74 74  Resp: 18 18  Temp: 99 F (37.2 C) 98.4 F (36.9 C)  SpO2: 97% 94%   General Awake, no distress.  Nontoxic-appearing HEENT NCAT.  CV:  Good peripheral perfusion.  RRR RESP:  Normal effort.  LCTAB ABD:  No distention.  Soft, generalized tenderness.  ED Results / Procedures / Treatments   Labs (all  labs ordered are listed, but only abnormal results are displayed) Labs Reviewed  COMPREHENSIVE METABOLIC PANEL WITH GFR - Abnormal; Notable for the following components:      Result Value   Sodium 133 (*)    Chloride 96 (*)    Glucose, Bld 106 (*)    BUN 6 (*)    Alkaline Phosphatase 132 (*)    All other components within normal limits  CBC - Abnormal; Notable for the following components:   WBC 14.4 (*)    All other components within normal limits  URINALYSIS, ROUTINE W REFLEX MICROSCOPIC - Abnormal; Notable for the following components:   Color, Urine YELLOW (*)    APPearance CLEAR (*)    Specific Gravity, Urine >1.046 (*)    Leukocytes,Ua TRACE (*)    All other components within normal limits  LIPASE, BLOOD  LACTIC ACID, PLASMA   RADIOLOGY  I personally viewed and evaluated these images as part of my medical decision making, as well as reviewing the written report by the radiologist.  CT ABDOMEN PELVIS W CONTRAST Result Date: 03/10/2024 CLINICAL DATA:  Diarrhea, weakness. EXAM: CT ABDOMEN AND PELVIS WITH CONTRAST TECHNIQUE: Multidetector CT imaging of the abdomen and pelvis was performed using the standard protocol following bolus administration of intravenous contrast. RADIATION DOSE REDUCTION: This exam was performed according to the departmental dose-optimization program which includes automated exposure control, adjustment of the mA and/or kV according to patient size and/or use of iterative reconstruction technique. CONTRAST:  OMNIPAQUE  IOHEXOL  300 MG/ML  SOLN COMPARISON:  None Available. FINDINGS: Lower chest: No acute abnormality. Hepatobiliary: No cholelithiasis or biliary dilatation is noted. Left hepatic cysts are noted. Pancreas: Unremarkable. No pancreatic ductal dilatation or surrounding inflammatory changes. Spleen: Normal in size without focal abnormality. Adrenals/Urinary Tract: Adrenal glands are unremarkable. Kidneys are normal, without renal calculi, focal  lesion, or hydronephrosis. Bladder is unremarkable. Stomach/Bowel: Stomach and appendix are unremarkable. There is no evidence of bowel obstruction. Mild wall thickening of descending and sigmoid colon is noted concerning for infectious or inflammatory colitis. Sigmoid diverticulosis is noted. Vascular/Lymphatic: Aortic atherosclerosis. No enlarged abdominal or pelvic lymph nodes. Reproductive: Status post hysterectomy. No adnexal masses. Other: No abdominal wall hernia or abnormality. No abdominopelvic ascites. Musculoskeletal: No acute or significant osseous findings. IMPRESSION: 1. Mild wall thickening of descending and sigmoid colon is noted concerning for infectious or inflammatory colitis. 2. Sigmoid diverticulosis. 3. Aortic atherosclerosis. Aortic Atherosclerosis (ICD10-I70.0). Electronically Signed   By: Lynwood Landy Raddle M.D.   On: 03/10/2024 16:57    PROCEDURES:  Critical Care performed: No  Procedures   MEDICATIONS ORDERED IN ED: Medications  morphine  (PF) 4 MG/ML injection 4 mg (4 mg Intravenous Given 03/10/24 1657)  ondansetron  (ZOFRAN ) injection 4 mg (4 mg Intravenous Given 03/10/24 1656)  sodium chloride  0.9 % bolus 1,000 mL (0 mLs Intravenous Stopped 03/10/24 2108)  iohexol  (OMNIPAQUE ) 300 MG/ML solution 100 mL (100 mLs Intravenous Contrast Given 03/10/24 1646)  amoxicillin -clavulanate (AUGMENTIN ) 875-125 MG per tablet 1 tablet (1 tablet Oral Given 03/10/24 2139)  loperamide  (IMODIUM ) capsule 4 mg (4 mg Oral Given 03/10/24 2139)     IMPRESSION / MDM / ASSESSMENT AND PLAN / ED COURSE  I reviewed the triage vital signs and the nursing notes.                              Clinical Course as of 03/10/24 2158  [place] Mar 10, 2024  1646 Comprehensive metabolic panel(!) Mildly low sodium 133 and chloride 96  BUN 6   Mild elevation Alkaline Phos 132 [MH]  1647 CBC(!) Leukocytosis 14.4  [MH]  1647 EKG 12-Lead [MH]  1647 NSR [MH]  1653 Lipase, blood 15 normal [MH]  1830 CT ABDOMEN  PELVIS W CONTRAST IMPRESSION: 1. Mild wall thickening of descending and sigmoid colon is noted concerning for infectious or inflammatory colitis. 2. Sigmoid diverticulosis. 3. Aortic atherosclerosis.   [MH]  1909 On reassessment patient reports symptoms are better.  Pending lactic acid and urinalysis. [MH]  1942 Lactic acid, plasma 1.0 [MH]    Clinical Course User Index [MH] Margrette Monte A, PA-C    79 y.o. female presents to the emergency department for evaluation and treatment of chronic diarrhea with associated abdominal pain. See HPI for further details.   Differential diagnosis includes, but is not limited to diverticulitis, viral gastroenteritis, electrolyte abnormality, IBS, IBD   Patient's presentation is most consistent with acute complicated illness / injury requiring diagnostic workup.  Patient is alert and oriented.  She is hemodynamically stable and afebrile.  On initial assessment patient is nontoxic-appearing.  Physical exam findings are pertinent for generalized tenderness throughout the abdomen. plan to obtain CT abdomen and pelvis, administer IV fluids, morphine  and zofran  for pain.   CT abdomen pelvis showing mild wall thickening possible indication for colitis. Discussed with patient admission versus outpatient management and she opted for outpatient management with antibiotics and Imodium .  This is reasonable as I I do feel that she is in stable condition for discharge home.  I will have her follow-up with GI for further evaluation as this is a chronic ongoing issue.  Strict ED return precautions were discussed.  All questions and concerns were addressed during this ED visit.  FINAL CLINICAL IMPRESSION(S) / ED DIAGNOSES   Final diagnoses:  Colitis  Diarrhea, unspecified type   Rx / DC Orders   ED Discharge Orders          Ordered    amoxicillin -clavulanate (AUGMENTIN ) 875-125 MG tablet  2 times daily        03/10/24 2127    loperamide  (IMODIUM  A-D) 2 MG  tablet  4 times daily PRN        03/10/24 2127             Note:  This document was prepared using Dragon voice recognition software and may include unintentional dictation errors.    Margrette, Pranathi Winfree A, PA-C 03/10/24 2202  "

## 2024-03-10 NOTE — Discharge Instructions (Addendum)
 Your evaluated in the ED for chronic diarrhea.  Your lab work was overall reassuring.  Your CT abdomen and pelvis showed:  IMPRESSION: 1. Mild wall thickening of descending and sigmoid colon is noted concerning for infectious or inflammatory colitis. 2. Sigmoid diverticulosis. 3. Aortic atherosclerosis.  Please take antibiotics as prescribed and until dose is complete.  Follow-up with GI for further evaluation and management.

## 2024-03-15 ENCOUNTER — Encounter
Admission: RE | Disposition: A | Discharge status: Home / Self Care | Payer: Self-pay | Source: Home / Self Care | Attending: Gastroenterology

## 2024-03-15 ENCOUNTER — Encounter: Payer: Self-pay | Admitting: Gastroenterology

## 2024-03-15 ENCOUNTER — Ambulatory Visit
Admission: RE | Admit: 2024-03-15 | Discharge: 2024-03-15 | Disposition: A | Discharge status: Home / Self Care | Attending: Gastroenterology | Admitting: Gastroenterology

## 2024-03-15 ENCOUNTER — Ambulatory Visit: Admitting: Certified Registered Nurse Anesthetist

## 2024-03-15 DIAGNOSIS — I110 Hypertensive heart disease with heart failure: Secondary | ICD-10-CM | POA: Diagnosis not present

## 2024-03-15 DIAGNOSIS — K219 Gastro-esophageal reflux disease without esophagitis: Secondary | ICD-10-CM | POA: Insufficient documentation

## 2024-03-15 DIAGNOSIS — K5730 Diverticulosis of large intestine without perforation or abscess without bleeding: Secondary | ICD-10-CM | POA: Insufficient documentation

## 2024-03-15 DIAGNOSIS — F419 Anxiety disorder, unspecified: Secondary | ICD-10-CM | POA: Diagnosis not present

## 2024-03-15 DIAGNOSIS — I5032 Chronic diastolic (congestive) heart failure: Secondary | ICD-10-CM | POA: Diagnosis not present

## 2024-03-15 DIAGNOSIS — J45909 Unspecified asthma, uncomplicated: Secondary | ICD-10-CM | POA: Diagnosis not present

## 2024-03-15 DIAGNOSIS — M797 Fibromyalgia: Secondary | ICD-10-CM | POA: Diagnosis not present

## 2024-03-15 DIAGNOSIS — Z79899 Other long term (current) drug therapy: Secondary | ICD-10-CM | POA: Diagnosis not present

## 2024-03-15 DIAGNOSIS — K529 Noninfective gastroenteritis and colitis, unspecified: Secondary | ICD-10-CM | POA: Insufficient documentation

## 2024-03-15 HISTORY — PX: BIOPSY OF SKIN SUBCUTANEOUS TISSUE AND/OR MUCOUS MEMBRANE: SHX6741

## 2024-03-15 HISTORY — PX: COLONOSCOPY: SHX5424

## 2024-03-15 SURGERY — COLONOSCOPY
Anesthesia: General

## 2024-03-15 MED ORDER — LIDOCAINE HCL (CARDIAC) PF 100 MG/5ML IV SOSY
PREFILLED_SYRINGE | INTRAVENOUS | Status: DC | PRN
  Administered 2024-03-15: 50 mg via INTRAVENOUS

## 2024-03-15 MED ORDER — PROPOFOL 500 MG/50ML IV EMUL
INTRAVENOUS | Status: DC | PRN
  Administered 2024-03-15: 150 ug/kg/min via INTRAVENOUS
  Administered 2024-03-15: 80 mg via INTRAVENOUS

## 2024-03-15 MED ORDER — SODIUM CHLORIDE 0.9 % IV SOLN: INTRAVENOUS | Status: DC

## 2024-03-15 NOTE — Anesthesia Postprocedure Evaluation (Signed)
"   Anesthesia Post Note  Patient: Andrea Savage  Procedure(s) Performed: COLONOSCOPY  Patient location during evaluation: PACU Anesthesia Type: General Level of consciousness: awake Pain management: pain level controlled Respiratory status: nonlabored ventilation Cardiovascular status: stable Anesthetic complications: no   No notable events documented.   Last Vitals:  Vitals:   03/15/24 1133 03/15/24 1142  BP: 114/60 130/72  Pulse: 73 74  Resp: 14 15  Temp:    SpO2: 100% 98%    Last Pain:  Vitals:   03/15/24 1142  TempSrc:   PainSc: 0-No pain                 VAN STAVEREN,Keren Alverio      "

## 2024-03-15 NOTE — Transfer of Care (Signed)
 Immediate Anesthesia Transfer of Care Note  Patient: Andrea Savage  Procedure(s) Performed: COLONOSCOPY  Patient Location: Endoscopy Unit  Anesthesia Type:General  Level of Consciousness: sedated and drowsy  Airway & Oxygen Therapy: Patient Spontanous Breathing  Post-op Assessment: Report given to RN and Post -op Vital signs reviewed and stable  Post vital signs: Reviewed and stable  Last Vitals:  Vitals Value Taken Time  BP 114/60 03/15/24 11:33  Temp    Pulse 73 03/15/24 11:33  Resp 14 03/15/24 11:33  SpO2 100 % 03/15/24 11:33    Last Pain:  Vitals:   03/15/24 1132  TempSrc:   PainSc: Asleep         Complications: No notable events documented.

## 2024-03-15 NOTE — Op Note (Signed)
 [facility]astroenterology Patient Name: Andrea Savage Procedure Date: 03/15/2024 10:52 AM MRN: [id number] Account #: [id number] Date of Birth: 1945-05-30 Admit Type: Outpatient Age: 79 Room: [facility] ENDO ROOM 4 Gender: Female Note Status: Finalized Instrument Name: Peds Colonoscope [id number] Procedure:             Colonoscopy Indications:           Last colonoscopy: January 2022, Chronic diarrhea,                         Clinically significant diarrhea of unexplained origin Providers:             Corinn Jess Brooklyn MD, MD Referring MD:          Lavenia Beaver, MD (Referring MD) Medicines:             General Anesthesia Complications:         No immediate complications. Estimated blood loss: None. Procedure:             Pre-Anesthesia Assessment:                        - Prior to the procedure, a History and Physical was                         performed, and patient medications and allergies were                         reviewed. The patient is competent. The risks and                         benefits of the procedure and the sedation options and                         risks were discussed with the patient. All questions                         were answered and informed consent was obtained.                         Patient identification and proposed procedure were                         verified by the physician, the nurse, the                         anesthesiologist, the anesthetist and the technician                         in the pre-procedure area in the procedure room in the                         endoscopy suite. Mental Status Examination: alert and                         oriented. Airway Examination: normal oropharyngeal                         airway and neck mobility. Respiratory Examination:  clear to auscultation. CV Examination: normal.                         Prophylactic Antibiotics: The patient does not require                          prophylactic antibiotics. Prior Anticoagulants: The                         patient has taken no anticoagulant or antiplatelet                         agents. ASA Grade Assessment: II - A patient with mild                         systemic disease. After reviewing the risks and                         benefits, the patient was deemed in satisfactory                         condition to undergo the procedure. The anesthesia                         plan was to use general anesthesia. Immediately prior                         to administration of medications, the patient was                         re-assessed for adequacy to receive sedatives. The                         heart rate, respiratory rate, oxygen saturations,                         blood pressure, adequacy of pulmonary ventilation, and                         response to care were monitored throughout the                         procedure. The physical status of the patient was                         re-assessed after the procedure.                        After obtaining informed consent, the colonoscope was                         passed under direct vision. Throughout the procedure,                         the patient's blood pressure, pulse, and oxygen                         saturations were monitored continuously. The  Colonoscope was introduced through the anus and                         advanced to the the terminal ileum, with                         identification of the appendiceal orifice and IC                         valve. The colonoscopy was performed without                         difficulty. The patient tolerated the procedure well.                         The quality of the bowel preparation was good. The                         terminal ileum, ileocecal valve, appendiceal orifice,                         and rectum were photographed. Findings:      The perianal and  digital rectal examinations were normal. Pertinent       negatives include normal sphincter tone and no palpable rectal lesions.      The terminal ileum appeared normal. Biopsies were taken with a cold       forceps for histology.      Diffuse mild inflammation characterized by congestion (edema), erythema,       friability, loss of vascularity and mucus was found in the ascending       colon and in the cecum. Biopsies were taken with a cold forceps for       histology.      The rectum, recto-sigmoid colon, sigmoid colon, descending colon and       transverse colon appeared normal. Biopsies were taken with a cold       forceps for histology.      Multiple medium-mouthed diverticula were found in the recto-sigmoid       colon.      The retroflexed view of the distal rectum and anal verge was normal and       showed no anal or rectal abnormalities. Impression:            - The examined portion of the ileum was normal.                         Biopsied.                        - Diffuse mild inflammation was found in the ascending                         colon and in the cecum secondary to colitis. Biopsied.                        - The rectum, sigmoid colon, descending colon,                         transverse colon and recto-sigmoid colon are normal.  Biopsied.                        - Diverticulosis in the recto-sigmoid colon.                        - The distal rectum and anal verge are normal on                         retroflexion view. Recommendation:        - Discharge patient to home (with escort).                        - Resume previous diet today.                        - Continue present medications.                        - Await pathology results.                        - Return to my office as previously scheduled. Procedure Code(s):     --- Professional ---                        [phone], Colonoscopy, flexible; with biopsy, single or                          multiple Diagnosis Code(s):     --- Professional ---                        K52.9, Noninfective gastroenteritis and colitis,                         unspecified                        R19.7, Diarrhea, unspecified                        K57.30, Diverticulosis of large intestine without                         perforation or abscess without bleeding CPT copyright 2022 American Medical Association. All rights reserved. The codes documented in this report are preliminary and upon coder review may  be revised to meet current compliance requirements. Dr. Corinn Brooklyn Corinn Jess Brooklyn MD, MD 03/15/2024 11:29:18 AM This report has been signed electronically. Number of Addenda: 0 Note Initiated On: 03/15/2024 10:52 AM Scope Withdrawal Time: 0 hours 15 minutes 23 seconds  Total Procedure Duration: 0 hours 20 minutes 33 seconds  Estimated Blood Loss:  Estimated blood loss: none.      [facility]

## 2024-03-15 NOTE — Anesthesia Procedure Notes (Signed)
 Date/Time: 03/15/2024 10:55 AM  Performed by: Dominica Krabbe, CRNAPre-anesthesia Checklist: Patient identified, Emergency Drugs available, Suction available, Patient being monitored and Timeout performed Patient Re-evaluated:Patient Re-evaluated prior to induction Oxygen Delivery Method: Nasal cannula Preoxygenation: Pre-oxygenation with 100% oxygen Induction Type: IV induction

## 2024-03-15 NOTE — Anesthesia Preprocedure Evaluation (Addendum)
 "                                  Anesthesia Evaluation  Patient identified by MRN, date of birth, ID band Patient awake    Reviewed: Allergy & Precautions, NPO status , Patient's Chart, lab work & pertinent test results  Airway Mallampati: II  TM Distance: >3 FB Neck ROM: Full    Dental  (+) Teeth Intact   Pulmonary neg pulmonary ROS, asthma    Pulmonary exam normal        Cardiovascular Exercise Tolerance: Good hypertension, Pt. on medications + dysrhythmias Supra Ventricular Tachycardia  Rhythm:Regular Rate:Normal     Neuro/Psych Seizures -, Well Controlled,   Anxiety     negative neurological ROS  negative psych ROS   GI/Hepatic negative GI ROS, Neg liver ROS,GERD  Medicated,,  Endo/Other  negative endocrine ROS    Renal/GU negative Renal ROS  negative genitourinary   Musculoskeletal  (+) Arthritis ,  Fibromyalgia -  Abdominal Normal abdominal exam  (+)   Peds  Hematology negative hematology ROS (+)   Anesthesia Other Findings Past Medical History: No date: (HFpEF) heart failure with preserved ejection fraction (HCC) No date: Actinic keratosis No date: Anxiety No date: Asthma No date: Depression No date: Fibromyalgia No date: GERD (gastroesophageal reflux disease) No date: Grade II diastolic dysfunction No date: H/O scarlet fever     Comment:  as child No date: Heart murmur     Comment:  s/p scarlet fever as child No date: HLD (hyperlipidemia) No date: HTN (hypertension) No date: IBS (irritable bowel syndrome) No date: Low bone density No date: Mitral regurgitation No date: OA (osteoarthritis) No date: Osteopenia No date: Palpitations No date: PHN (postherpetic neuralgia) No date: PSVT (paroxysmal supraventricular tachycardia) No date: Recurrent syncope No date: Rosacea 1978: Seizure (HCC)     Comment:  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 12/15/2021: Squamous cell carcinoma of skin     Comment:  R spinal mid lower back, EDC No  date: Wears hearing aid in both ears  Past Surgical History: 2009: ABDOMINAL HYSTERECTOMY     Comment:  Total 1979: BREAST EXCISIONAL BIOPSY; Left     Comment:  NEG No date: BREAST SURGERY 01/19/2011: COLONOSCOPY     Comment:  repeat 10 years 03/24/2020: COLONOSCOPY WITH PROPOFOL ; N/A     Comment:  Procedure: COLONOSCOPY WITH PROPOFOL ;  Surgeon:               Maryruth Ole DASEN, MD;  Location: [facility] ENDOSCOPY;                Service: Endoscopy;  Laterality: N/A; 03/24/2020: ESOPHAGOGASTRODUODENOSCOPY (EGD) WITH PROPOFOL ; N/A     Comment:  Procedure: ESOPHAGOGASTRODUODENOSCOPY (EGD) WITH               PROPOFOL ;  Surgeon: Maryruth Ole DASEN, MD;  Location:               [facility] ENDOSCOPY;  Service: Endoscopy;  Laterality: N/A; 09/21/2017: EXCISION MORTON'S NEUROMA; Left     Comment:  Procedure: EXCISION MORTON'S NEUROMA;  Surgeon: Lilli Cough, DPM;  Location: [facility];  Service:               Podiatry;  Laterality: Left; 1990: FOOT SURGERY; Right No date: HAND SURGERY; Right No date: JOINT REPLACEMENT 12/14/2020: KNEE ARTHROPLASTY; Right  Comment:  Procedure: COMPUTER ASSISTED TOTAL KNEE ARTHROPLASTY;                Surgeon: Mardee Lynwood SQUIBB, MD;  Location: [facility] ORS;                Service: Orthopedics;  Laterality: Right; 10/14/2020: KNEE ARTHROSCOPY; Right     Comment:  Procedure: ARTHROSCOPY KNEE, LATERAL MENISECTOMY;                Surgeon: Mardee Lynwood SQUIBB, MD;  Location: [facility] ORS;                Service: Orthopedics;  Laterality: Right; No date: LAPAROSCOPIC OOPHERECTOMY 11/22/2018: MINOR HARDWARE REMOVAL; Left     Comment:  Procedure: REMOVAL SCREW;DEEP 2nd and 3RD LEFT CAPSULE               RELEASE OF THIRD METATARSAL PHALANGEAL JOINT LEFT, TENDON              LENGTHING THIRD TOE LEFT AND CORTISONE INJECTION LEFT;                Surgeon: Lilli Cough, DPM;  Location: [facility];  Service: Podiatry;  Laterality: Left;   LMA OR IVA               LOCAL No date: TONSILLECTOMY 09/21/2017: WEIL OSTEOTOMY; Left     Comment:  Procedure: WEIL OSTEOTOMY-2ND & 3RD TOES;  Surgeon:               Lilli Cough, DPM;  Location: [facility];                Service: Podiatry;  Laterality: Left;  LMA WITH               LOCAL MINI MONSTER DR TROXLER WANTS TED HOSE ON               PATIENT'S RIGHT LEG  BMI    Body Mass Index: 25.53 kg/m      Reproductive/Obstetrics negative OB ROS                              Anesthesia Physical Anesthesia Plan  ASA: 2  Anesthesia Plan: General   Post-op Pain Management:    Induction: Intravenous  PONV Risk Score and Plan: Propofol  infusion and TIVA  Airway Management Planned: Natural Airway and Nasal Cannula  Additional Equipment:   Intra-op Plan:   Post-operative Plan:   Informed Consent: I have reviewed the patients History and Physical, chart, labs and discussed the procedure including the risks, benefits and alternatives for the proposed anesthesia with the patient or authorized representative who has indicated his/her understanding and acceptance.     Dental Advisory Given  Plan Discussed with: CRNA  Anesthesia Plan Comments:          Anesthesia Quick Evaluation  "

## 2024-03-15 NOTE — H&P (Signed)
 "   Corinn JONELLE Brooklyn, MD [facility]astroenter[facility], DHIP [address]  [place], [place] [address]  Main: [phone] Fax:  [phone] Pager: [phone]   Primary Care Physician:  Sherial Bail, MD Primary Gastroenterologist:  Dr. Corinn JONELLE Brooklyn  Pre-Procedure History & Physical: HPI:  Andrea Savage is a 79 y.o. female is here for an colonoscopy.   Past Medical History:  Diagnosis Date   (HFpEF) heart failure with preserved ejection fraction (HCC)    Actinic keratosis    Anxiety    Asthma    Depression    Fibromyalgia    GERD (gastroesophageal reflux disease)    Grade II diastolic dysfunction    H/O scarlet fever    as child   Heart murmur    s/p scarlet fever as child   HLD (hyperlipidemia)    HTN (hypertension)    IBS (irritable bowel syndrome)    Low bone density    Mitral regurgitation    OA (osteoarthritis)    Osteopenia    Palpitations    PHN (postherpetic neuralgia)    PSVT (paroxysmal supraventricular tachycardia)    Recurrent syncope    Rosacea    Seizure (HCC) 1978   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   Squamous cell carcinoma of skin 12/15/2021   R spinal mid lower back, EDC   Wears hearing aid in both ears     Past Surgical History:  Procedure Laterality Date   ABDOMINAL HYSTERECTOMY  2009   Total   BREAST EXCISIONAL BIOPSY Left 1979   NEG   BREAST SURGERY     COLONOSCOPY  01/19/2011   repeat 10 years   COLONOSCOPY WITH PROPOFOL  N/A 03/24/2020   Procedure: COLONOSCOPY WITH PROPOFOL ;  Surgeon: Maryruth Ole DASEN, MD;  Location: [facility] END[facility];  Service: Endoscopy;  Laterality: N/A;   ESOPHAGOGASTRODUODENOSCOPY (EGD) WITH PROPOFOL  N/A 03/24/2020   Procedure: ESOPHAGOGASTRODUODENOSCOPY (EGD) WITH PROPOFOL ;  Surgeon: Maryruth Ole DASEN, MD;  Location: [facility] END[facility];  Service: Endoscopy;  Laterality: N/A;   EXCISION MORTON'S NEUROMA Left 09/21/2017   Procedure: EXCISION MORTON'S NEUROMA;  Surgeon: Lilli Cough, DPM;   Location: [facility];  Service: Podiatry;  Laterality: Left;   FOOT SURGERY Right 1990   HAND SURGERY Right    JOINT REPLACEMENT     KNEE ARTHROPLASTY Right 12/14/2020   Procedure: COMPUTER ASSISTED TOTAL KNEE ARTHROPLASTY;  Surgeon: Mardee Lynwood SQUIBB, MD;  Location: [facility] ORS;  Service: Orthopedics;  Laterality: Right;   KNEE ARTHROSCOPY Right 10/14/2020   Procedure: ARTHROSCOPY KNEE, LATERAL MENISECTOMY;  Surgeon: Mardee Lynwood SQUIBB, MD;  Location: [facility];  Service: Orthopedics;  Laterality: Right;   LAPAROSCOPIC OOPHERECTOMY     MINOR HARDWARE REMOVAL Left 11/22/2018   Procedure: REMOVAL SCREW;DEEP 2nd and 3RD LEFT CAPSULE RELEASE OF THIRD METATARSAL PHALANGEAL JOINT LEFT, TENDON LENGTHING THIRD TOE LEFT AND CORTISONE INJECTION LEFT;  Surgeon: Lilli Cough, DPM;  Location: [facility];  Service: Podiatry;  Laterality: Left;  LMA OR IVA LOCAL   TONSILLECTOMY     WEIL OSTEOTOMY Left 09/21/2017   Procedure: WEIL OSTEOTOMY-2ND & 3RD TOES;  Surgeon: Lilli Cough, DPM;  Location: [facility];  Service: Podiatry;  Laterality: Left;  LMA WITH LOCAL MINI MONSTER DR TROXLER WANTS TED HOSE ON PATIENT'S RIGHT LEG    Prior to Admission medications  Medication Sig Start Date End Date Taking? Authorizing Provider  azelastine (ASTELIN) 0.1 % nasal spray Place 1 spray into the nose. 10/09/23  Yes [provider]  busPIRone  (BUSPAR )  5 MG tablet Take 1 tablet (5 mg total) by mouth daily. 11/25/19  Yes Johnson, Megan P, DO  citalopram  (CELEXA ) 20 MG tablet Take 20 mg by mouth daily. 08/14/20  Yes [provider]  cyanocobalamin  (VITAMIN B12) 1000 MCG tablet Take 1,000 mcg by mouth daily.   Yes [provider]  fluticasone (FLONASE) 50 MCG/ACT nasal spray 2 sprays. 08/15/22  Yes [provider]  loperamide  (IMODIUM  A-D) 2 MG tablet Take 1 tablet (2 mg total) by mouth 4 (four) times daily as needed. 03/10/24  Yes Harrison, Myah A, PA-C  loratadine   (CLARITIN ) 10 MG tablet Take 10 mg by mouth daily.   Yes [provider]  metoprolol  succinate (TOPROL -XL) 25 MG 24 hr tablet TAKE 1 TABLET (25 MG TOTAL) BY MOUTH DAILY. 01/03/24  Yes Wittenborn, Barnie, NP  Misc Natural Products (OSTEO BI-FLEX JOINT SHIELD PO) Take by mouth daily at 2 am.   Yes [provider]  Moringa Oleifera (MORINGA PO) Take by mouth daily at 8 pm.   Yes [provider]  naproxen sodium (ALEVE) 220 MG tablet Take 220 mg by mouth as needed.   Yes [provider]  omeprazole (PRILOSEC) 40 MG capsule Take 40 mg by mouth in the morning and at bedtime. 09/25/18  Yes [provider]  tretinoin  (RETIN-A ) 0.05 % cream Apply pea sized amount to face at bedtime, wash off qam 05/09/23  Yes Jackquline Sawyer, MD  albuterol  (VENTOLIN  HFA) [phone] Base) MCG/ACT inhaler Inhale 2 puffs into the lungs every 6 (six) hours as needed for wheezing or shortness of breath. 08/23/19   Cannady, Jolene T, NP  amoxicillin -clavulanate (AUGMENTIN ) 875-125 MG tablet Take 1 tablet by mouth 2 (two) times daily for 7 days. Patient not taking: Reported on 03/15/2024 03/10/24 03/17/24  Margrette Monte A, PA-C  carboxymethylcellulose (REFRESH PLUS) 0.5 % SOLN Place 1 drop into both eyes as needed (dry eyes).    [provider]  EPINEPHrine  0.3 mg/0.3 mL IJ SOAJ injection Inject 0.3 mg into the muscle as needed for anaphylaxis. 10/15/18   [provider]  ezetimibe  (ZETIA ) 10 MG tablet Take 1 tablet (10 mg total) by mouth daily. 01/29/24 07/27/24  Loistine Barnie, NP  fluticasone (FLOVENT HFA) 220 MCG/ACT inhaler Inhale into the lungs as needed. Patient not taking: Reported on 03/15/2024 02/07/21   [provider]  furosemide  (LASIX ) 20 MG tablet Take 20 mg by mouth daily as needed. Patient not taking: Reported on 03/15/2024    [provider]  HYDROcodone -acetaminophen  (NORCO/VICODIN) 5-325 MG tablet Take 0.5 tablets by mouth 2 (two) times daily as  needed. Patient not taking: Reported on 03/15/2024 11/24/21   [provider]  pregabalin (LYRICA) 75 MG capsule Take 75 mg by mouth. 10/09/23 01/29/24  [provider]    Allergies as of 03/13/2024 - Review Complete 03/10/2024  Allergen Reaction Noted   Compazine [prochlorperazine] Other (See Comments) 08/18/2014   Dust mite extract Cough 06/13/2018   Oxycodone  Nausea Only 02/02/2021   Gabapentin  Other (See Comments) 12/14/2020   Molds & smuts Cough 08/15/2022    Family History  Problem Relation Age of Onset   Melanoma Mother    Cancer Mother        breast   Breast cancer Mother 51   Cancer Father        pancreatic   Asthma Father    Heart disease Father    Psoriasis Sister    Liver disease Sister    Diabetes  Sister    Thyroid  disease Sister    Melanoma Brother    Thyroid  disease Brother    Kidney disease Brother     Social History   Socioeconomic History   Marital status: Married    Spouse name: Not on file   Number of children: Not on file   Years of education: Not on file   Highest education level: Some college, no degree  Occupational History   Occupation: retired  Tobacco Use   Smoking status: Never   Smokeless tobacco: Never  Vaping Use   Vaping status: Never Used  Substance and Sexual Activity   Alcohol  use: Yes    Alcohol /week: 1.0 standard drink of alcohol     Types: 1 Glasses of wine per week    Comment: on occasion   Drug use: No   Sexual activity: Not Currently  Other Topics Concern   Not on file  Social History Narrative   Pt lives in 2 story home with her husband, Camellia   Has 2 adult children   Some college education   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   Social Drivers of Health   Tobacco Use: Low Risk (03/15/2024)   Patient History    Smoking Tobacco Use: Never    Smokeless Tobacco Use: Never    Passive Exposure: Not on file  Financial Resource Strain: Low Risk  (03/13/2024)   Received from [facility]    Overall Financial Resource Strain (CARDIA)    Difficulty of Paying Living Expenses: Not hard at all  Food Insecurity: No Food Insecurity (03/13/2024)   Received from [facility]   Epic    Within the past 12 months, you worried that your food would run out before you got the money to buy more.: Never true    Within the past 12 months, the food you bought just didn't last and you didn't have money to get more.: Never true  Transportation Needs: No Transportation Needs (03/13/2024)   Received from [facility] - Transportation    In the past 12 months, has lack of transportation kept you from medical appointments or from getting medications?: No    Lack of Transportation (Non-Medical): No  Physical Activity: Not on file  Stress: Not on file  Social Connections: Not on file  Intimate Partner Violence: Not At Risk (08/02/2023)   Received from [facility]   Epic    Within the last year, have you been afraid of your partner or ex-partner?: No    Within the last year, have you been humiliated or emotionally abused in other ways by your partner or ex-partner?: No    Within the last year, have you been kicked, hit, slapped, or otherwise physically hurt by your partner or ex-partner?: No    Within the last year, have you been raped or forced to have any kind of sexual activity by your partner or ex-partner?: No  Depression (PHQ2-9): Not on file  Alcohol  Screen: Not on file  Housing: Low Risk  (03/13/2024)   Received from [facility]   Epic    In the last 12 months, was there a time when you were not able to pay the mortgage or rent on time?: No    In the past 12 months, how many times have you moved where you were living?: 0    At any time in the past 12 months, were you homeless or living in a shelter (  including now)?: No  Utilities: Not At Risk (03/13/2024)   Received from [facility]   Epic    In the past 12 months  has the electric, gas, oil, or water company threatened to shut off services in your home?: No  Health Literacy: Not on file    Review of Systems: See HPI, otherwise negative ROS  Physical Exam: BP 125/66   Pulse 78   Temp (!) 96.6 F (35.9 C) (Temporal)   Resp 16   Ht 5' 5 (1.651 m)   Wt 69.6 kg   SpO2 100%   BMI 25.53 kg/m  General:   Alert,  pleasant and cooperative in NAD Head:  Normocephalic and atraumatic. Neck:  Supple; no masses or thyromegaly. Lungs:  Clear throughout to auscultation.    Heart:  Regular rate and rhythm. Abdomen:  Soft, nontender and nondistended. Normal bowel sounds, without guarding, and without rebound.   Neurologic:  Alert and  oriented x4;  grossly normal neurologically.  Impression/Plan: JOJO GEVING is here for an colonoscopy to be performed for chronic diarrhea  Risks, benefits, limitations, and alternatives regarding  colonoscopy have been reviewed with the patient.  Questions have been answered.  All parties agreeable.   Corinn Brooklyn, MD  03/15/2024, 11:00 AM "

## 2024-03-18 LAB — SURGICAL PATHOLOGY

## 2024-03-20 ENCOUNTER — Ambulatory Visit: Payer: Self-pay | Admitting: Gastroenterology

## 2024-05-27 ENCOUNTER — Encounter: Admitting: Dermatology

## 2024-07-25 ENCOUNTER — Ambulatory Visit: Admitting: Student
